# Patient Record
Sex: Female | Born: 1945 | Race: White | Hispanic: No | Smoking: Never smoker
Health system: Southern US, Community
[De-identification: ages and names within clinical notes are randomized; demographics above are authoritative.]

## PROBLEM LIST (undated history)

## (undated) DIAGNOSIS — R600 Localized edema: Secondary | ICD-10-CM

## (undated) DIAGNOSIS — E559 Vitamin D deficiency, unspecified: Secondary | ICD-10-CM

## (undated) DIAGNOSIS — C50919 Malignant neoplasm of unspecified site of unspecified female breast: Secondary | ICD-10-CM

## (undated) DIAGNOSIS — K5730 Diverticulosis of large intestine without perforation or abscess without bleeding: Secondary | ICD-10-CM

## (undated) DIAGNOSIS — T7840XA Allergy, unspecified, initial encounter: Secondary | ICD-10-CM

## (undated) DIAGNOSIS — E785 Hyperlipidemia, unspecified: Secondary | ICD-10-CM

## (undated) DIAGNOSIS — I214 Non-ST elevation (NSTEMI) myocardial infarction: Secondary | ICD-10-CM

## (undated) DIAGNOSIS — I10 Essential (primary) hypertension: Secondary | ICD-10-CM

## (undated) DIAGNOSIS — R609 Edema, unspecified: Secondary | ICD-10-CM

## (undated) DIAGNOSIS — E079 Disorder of thyroid, unspecified: Secondary | ICD-10-CM

## (undated) DIAGNOSIS — I499 Cardiac arrhythmia, unspecified: Secondary | ICD-10-CM

## (undated) HISTORY — DX: Allergy, unspecified, initial encounter: T78.40XA

## (undated) HISTORY — DX: Non-ST elevation (NSTEMI) myocardial infarction: I21.4

## (undated) HISTORY — DX: Vitamin D deficiency, unspecified: E55.9

## (undated) HISTORY — DX: Disorder of thyroid, unspecified: E07.9

## (undated) HISTORY — PX: MASTECTOMY: SHX3

## (undated) HISTORY — DX: Hyperlipidemia, unspecified: E78.5

## (undated) HISTORY — PX: CHOLECYSTECTOMY: SHX55

## (undated) HISTORY — DX: Edema, unspecified: R60.9

## (undated) HISTORY — DX: Localized edema: R60.0

## (undated) HISTORY — DX: Essential (primary) hypertension: I10

## (undated) HISTORY — DX: Malignant neoplasm of unspecified site of unspecified female breast: C50.919

## (undated) HISTORY — DX: Cardiac arrhythmia, unspecified: I49.9

## (undated) HISTORY — PX: ABDOMINAL HYSTERECTOMY: SHX81

---

## 2000-02-03 ENCOUNTER — Encounter: Payer: Self-pay | Admitting: Hematology and Oncology

## 2000-02-03 ENCOUNTER — Encounter: Admission: RE | Admit: 2000-02-03 | Discharge: 2000-02-03 | Payer: Self-pay | Admitting: Hematology and Oncology

## 2000-07-03 ENCOUNTER — Encounter: Admission: RE | Admit: 2000-07-03 | Discharge: 2000-07-03 | Payer: Self-pay | Admitting: Family Medicine

## 2000-07-03 ENCOUNTER — Encounter: Payer: Self-pay | Admitting: Family Medicine

## 2001-08-15 ENCOUNTER — Ambulatory Visit (HOSPITAL_COMMUNITY): Admission: RE | Admit: 2001-08-15 | Discharge: 2001-08-15 | Payer: Self-pay | Admitting: Obstetrics and Gynecology

## 2001-08-15 ENCOUNTER — Encounter: Payer: Self-pay | Admitting: Obstetrics and Gynecology

## 2001-12-27 ENCOUNTER — Other Ambulatory Visit: Admission: RE | Admit: 2001-12-27 | Discharge: 2001-12-27 | Payer: Self-pay | Admitting: Obstetrics and Gynecology

## 2003-01-08 ENCOUNTER — Encounter: Payer: Self-pay | Admitting: Cardiology

## 2003-01-08 ENCOUNTER — Inpatient Hospital Stay (HOSPITAL_COMMUNITY): Admission: EM | Admit: 2003-01-08 | Discharge: 2003-01-10 | Payer: Self-pay | Admitting: Emergency Medicine

## 2003-01-09 ENCOUNTER — Encounter: Payer: Self-pay | Admitting: Cardiology

## 2003-05-07 ENCOUNTER — Encounter: Payer: Self-pay | Admitting: Emergency Medicine

## 2003-05-07 ENCOUNTER — Inpatient Hospital Stay (HOSPITAL_COMMUNITY): Admission: AD | Admit: 2003-05-07 | Discharge: 2003-05-11 | Payer: Self-pay | Admitting: Emergency Medicine

## 2003-05-08 ENCOUNTER — Encounter: Payer: Self-pay | Admitting: Neurology

## 2004-02-08 ENCOUNTER — Emergency Department (HOSPITAL_COMMUNITY): Admission: EM | Admit: 2004-02-08 | Discharge: 2004-02-08 | Payer: Self-pay | Admitting: Emergency Medicine

## 2004-02-11 ENCOUNTER — Ambulatory Visit (HOSPITAL_COMMUNITY): Admission: RE | Admit: 2004-02-11 | Discharge: 2004-02-11 | Payer: Self-pay | Admitting: Family Medicine

## 2004-04-18 ENCOUNTER — Encounter: Admission: RE | Admit: 2004-04-18 | Discharge: 2004-04-18 | Payer: Self-pay | Admitting: Orthopaedic Surgery

## 2004-05-19 ENCOUNTER — Ambulatory Visit (HOSPITAL_COMMUNITY): Admission: RE | Admit: 2004-05-19 | Discharge: 2004-05-19 | Payer: Self-pay | Admitting: Neurology

## 2004-05-19 ENCOUNTER — Ambulatory Visit (HOSPITAL_COMMUNITY): Admission: RE | Admit: 2004-05-19 | Discharge: 2004-05-19 | Payer: Self-pay | Admitting: Obstetrics and Gynecology

## 2004-12-29 ENCOUNTER — Ambulatory Visit (HOSPITAL_COMMUNITY): Admission: RE | Admit: 2004-12-29 | Discharge: 2004-12-29 | Payer: Self-pay | Admitting: Family Medicine

## 2005-01-09 ENCOUNTER — Ambulatory Visit (HOSPITAL_COMMUNITY): Admission: RE | Admit: 2005-01-09 | Discharge: 2005-01-09 | Payer: Self-pay | Admitting: Family Medicine

## 2005-01-15 ENCOUNTER — Emergency Department (HOSPITAL_COMMUNITY): Admission: EM | Admit: 2005-01-15 | Discharge: 2005-01-15 | Payer: Self-pay | Admitting: Emergency Medicine

## 2005-02-03 ENCOUNTER — Ambulatory Visit: Payer: Self-pay | Admitting: Internal Medicine

## 2005-02-16 ENCOUNTER — Ambulatory Visit: Payer: Self-pay | Admitting: Internal Medicine

## 2005-02-16 ENCOUNTER — Ambulatory Visit (HOSPITAL_COMMUNITY): Admission: RE | Admit: 2005-02-16 | Discharge: 2005-02-16 | Payer: Self-pay | Admitting: Internal Medicine

## 2005-08-09 ENCOUNTER — Ambulatory Visit: Payer: Self-pay | Admitting: Cardiology

## 2005-08-30 ENCOUNTER — Ambulatory Visit: Payer: Self-pay | Admitting: Cardiology

## 2005-12-08 ENCOUNTER — Ambulatory Visit: Payer: Self-pay | Admitting: Cardiology

## 2005-12-14 ENCOUNTER — Inpatient Hospital Stay (HOSPITAL_BASED_OUTPATIENT_CLINIC_OR_DEPARTMENT_OTHER): Admission: RE | Admit: 2005-12-14 | Discharge: 2005-12-14 | Payer: Self-pay | Admitting: Internal Medicine

## 2005-12-14 ENCOUNTER — Ambulatory Visit: Payer: Self-pay | Admitting: Internal Medicine

## 2005-12-27 ENCOUNTER — Ambulatory Visit: Payer: Self-pay

## 2005-12-31 ENCOUNTER — Encounter: Admission: RE | Admit: 2005-12-31 | Discharge: 2005-12-31 | Payer: Self-pay | Admitting: Neurology

## 2006-01-24 ENCOUNTER — Ambulatory Visit: Payer: Self-pay | Admitting: Cardiology

## 2006-02-12 ENCOUNTER — Ambulatory Visit: Payer: Self-pay | Admitting: Internal Medicine

## 2006-02-21 ENCOUNTER — Ambulatory Visit: Payer: Self-pay | Admitting: Internal Medicine

## 2006-03-02 ENCOUNTER — Ambulatory Visit: Payer: Self-pay | Admitting: Internal Medicine

## 2006-03-29 ENCOUNTER — Encounter
Admission: RE | Admit: 2006-03-29 | Discharge: 2006-06-27 | Payer: Self-pay | Admitting: Physical Medicine & Rehabilitation

## 2006-03-29 ENCOUNTER — Ambulatory Visit: Payer: Self-pay | Admitting: Physical Medicine & Rehabilitation

## 2006-04-11 ENCOUNTER — Ambulatory Visit (HOSPITAL_COMMUNITY): Admission: RE | Admit: 2006-04-11 | Discharge: 2006-04-11 | Payer: Self-pay | Admitting: Internal Medicine

## 2006-05-01 ENCOUNTER — Emergency Department (HOSPITAL_COMMUNITY): Admission: EM | Admit: 2006-05-01 | Discharge: 2006-05-01 | Payer: Self-pay | Admitting: Emergency Medicine

## 2006-05-04 ENCOUNTER — Ambulatory Visit: Payer: Self-pay | Admitting: Family Medicine

## 2006-05-04 ENCOUNTER — Ambulatory Visit (HOSPITAL_COMMUNITY): Admission: RE | Admit: 2006-05-04 | Discharge: 2006-05-04 | Payer: Self-pay | Admitting: Family Medicine

## 2006-06-14 ENCOUNTER — Ambulatory Visit: Payer: Self-pay | Admitting: Family Medicine

## 2006-07-17 ENCOUNTER — Ambulatory Visit: Payer: Self-pay | Admitting: Cardiology

## 2006-08-02 ENCOUNTER — Ambulatory Visit: Payer: Self-pay | Admitting: Internal Medicine

## 2006-08-07 ENCOUNTER — Ambulatory Visit (HOSPITAL_COMMUNITY): Admission: RE | Admit: 2006-08-07 | Discharge: 2006-08-07 | Payer: Self-pay | Admitting: Internal Medicine

## 2006-08-23 ENCOUNTER — Ambulatory Visit: Payer: Self-pay | Admitting: Internal Medicine

## 2006-08-24 ENCOUNTER — Ambulatory Visit (HOSPITAL_COMMUNITY): Admission: RE | Admit: 2006-08-24 | Discharge: 2006-08-24 | Payer: Self-pay | Admitting: Internal Medicine

## 2006-09-25 ENCOUNTER — Encounter: Admission: RE | Admit: 2006-09-25 | Discharge: 2006-10-23 | Payer: Self-pay | Admitting: Psychology

## 2006-09-25 ENCOUNTER — Ambulatory Visit: Payer: Self-pay | Admitting: Psychology

## 2006-11-26 ENCOUNTER — Ambulatory Visit: Payer: Self-pay | Admitting: Physical Medicine & Rehabilitation

## 2006-11-26 ENCOUNTER — Encounter
Admission: RE | Admit: 2006-11-26 | Discharge: 2007-02-24 | Payer: Self-pay | Admitting: Physical Medicine & Rehabilitation

## 2007-01-08 ENCOUNTER — Ambulatory Visit: Payer: Self-pay

## 2007-01-23 ENCOUNTER — Ambulatory Visit: Payer: Self-pay | Admitting: Cardiology

## 2007-03-26 ENCOUNTER — Ambulatory Visit (HOSPITAL_COMMUNITY): Admission: RE | Admit: 2007-03-26 | Discharge: 2007-03-26 | Payer: Self-pay | Admitting: Family Medicine

## 2007-07-26 ENCOUNTER — Ambulatory Visit: Payer: Self-pay | Admitting: Cardiology

## 2007-08-07 ENCOUNTER — Encounter: Payer: Self-pay | Admitting: Cardiology

## 2007-08-07 ENCOUNTER — Ambulatory Visit: Payer: Self-pay

## 2007-08-26 ENCOUNTER — Ambulatory Visit: Payer: Self-pay | Admitting: Physical Medicine & Rehabilitation

## 2007-08-26 ENCOUNTER — Encounter
Admission: RE | Admit: 2007-08-26 | Discharge: 2007-10-22 | Payer: Self-pay | Admitting: Physical Medicine & Rehabilitation

## 2007-11-20 ENCOUNTER — Ambulatory Visit: Payer: Self-pay | Admitting: Cardiology

## 2007-11-22 ENCOUNTER — Ambulatory Visit: Payer: Self-pay

## 2007-12-05 ENCOUNTER — Encounter
Admission: RE | Admit: 2007-12-05 | Discharge: 2007-12-16 | Payer: Self-pay | Admitting: Physical Medicine & Rehabilitation

## 2007-12-13 ENCOUNTER — Ambulatory Visit: Payer: Self-pay | Admitting: Physical Medicine & Rehabilitation

## 2007-12-24 ENCOUNTER — Encounter
Admission: RE | Admit: 2007-12-24 | Discharge: 2008-01-21 | Payer: Self-pay | Admitting: Physical Medicine & Rehabilitation

## 2008-01-14 ENCOUNTER — Ambulatory Visit: Payer: Self-pay | Admitting: Physical Medicine & Rehabilitation

## 2008-05-14 ENCOUNTER — Ambulatory Visit (HOSPITAL_COMMUNITY): Admission: RE | Admit: 2008-05-14 | Discharge: 2008-05-14 | Payer: Self-pay | Admitting: Family Medicine

## 2008-07-02 ENCOUNTER — Ambulatory Visit: Payer: Self-pay

## 2010-05-04 DIAGNOSIS — E039 Hypothyroidism, unspecified: Secondary | ICD-10-CM | POA: Insufficient documentation

## 2010-05-04 DIAGNOSIS — I6350 Cerebral infarction due to unspecified occlusion or stenosis of unspecified cerebral artery: Secondary | ICD-10-CM | POA: Insufficient documentation

## 2010-05-04 DIAGNOSIS — R001 Bradycardia, unspecified: Secondary | ICD-10-CM

## 2010-05-04 DIAGNOSIS — E785 Hyperlipidemia, unspecified: Secondary | ICD-10-CM | POA: Insufficient documentation

## 2010-05-04 DIAGNOSIS — I10 Essential (primary) hypertension: Secondary | ICD-10-CM

## 2010-06-20 ENCOUNTER — Ambulatory Visit: Payer: Self-pay | Admitting: Cardiovascular Disease

## 2010-06-20 DIAGNOSIS — I429 Cardiomyopathy, unspecified: Secondary | ICD-10-CM | POA: Insufficient documentation

## 2010-12-04 ENCOUNTER — Encounter: Payer: Self-pay | Admitting: Family Medicine

## 2010-12-04 ENCOUNTER — Encounter: Payer: Self-pay | Admitting: Orthopedic Surgery

## 2010-12-13 NOTE — Assessment & Plan Note (Signed)
Summary: f1y/jml  Medications Added LIPITOR 20 MG TABS (ATORVASTATIN CALCIUM) 1 tab at bedtime LEVOTHYROXINE SODIUM 150 MCG TABS (LEVOTHYROXINE SODIUM) 1 tab once daily BENAZEPRIL HCL 20 MG TABS (BENAZEPRIL HCL) 1 tab once daily IBUPROFEN 600 MG TABS (IBUPROFEN) 1 tab once daily FEXOFENADINE HCL 180 MG TABS (FEXOFENADINE HCL) 1 tab once daily ISOSORBIDE MONONITRATE CR 60 MG XR24H-TAB (ISOSORBIDE MONONITRATE) 1 tab once daily FUROSEMIDE 40 MG TABS (FUROSEMIDE) 1 tab once daily ASPIRIN 81 MG TBEC (ASPIRIN) Take one tablet by mouth daily NOVOLOG MIX 70/30 70-30 % SUSP (INSULIN ASPART PROT & ASPART) 35 units once daily      Allergies Added: NKDA  Visit Type:  Follow-up Primary Provider:  Dr. Elana Alm.[facility] [place]  CC:  edema/legs...no other complaints today.  History of Present Illness: [name] is a pleasant 65 year old Caucasian female with a history of diabetes mellitus, hypertension, hyperlipidemia, hypothyroidism, depression, chronic back pain, and a remote non-ST elevation myocardial infarction with left ventricular wall motion abnormality in 2004.  It was felt at that time to be related to stress induced cardiomyopathy.  Her heart catheterization was in February 2004 and showed no obstructive CAD.  Repeat echocardiogram later showed resolution of her left ventricular dysfunction. Dr. Diona Browner did have her undergo a myocardial perfusion stress study in January 2009, which showed no evidence of myocardial ischemia and normal contractility of the left ventricle. She had been followed in the past by Dr. Diona Browner. I saw her for the first time in 2009.  She is here today for routine scheduled cardiac follow up. She denies any chest pain with exertion, shortness of breath, palpitations, dizziness, near syncope, syncope, orthopnea, PND or change in her chronic lower extremity swelling, which has been trace to mild.    Her chronic back pain is much better.   Current  Medications (verified): 1)  Amlodipine Besylate 5 Mg Tabs (Amlodipine Besylate) .... Take One Tablet By Mouth Daily 2)  Lipitor 20 Mg Tabs (Atorvastatin Calcium) .Marland Kitchen.. 1 Tab At Bedtime 3)  Levothyroxine Sodium 150 Mcg Tabs (Levothyroxine Sodium) .Marland Kitchen.. 1 Tab Once Daily 4)  Benazepril Hcl 20 Mg Tabs (Benazepril Hcl) .Marland Kitchen.. 1 Tab Once Daily 5)  Ibuprofen 600 Mg Tabs (Ibuprofen) .Marland Kitchen.. 1 Tab Once Daily 6)  Fexofenadine Hcl 180 Mg Tabs (Fexofenadine Hcl) .Marland Kitchen.. 1 Tab Once Daily 7)  Isosorbide Mononitrate Cr 60 Mg Xr24h-Tab (Isosorbide Mononitrate) .Marland Kitchen.. 1 Tab Once Daily 8)  Furosemide 40 Mg Tabs (Furosemide) .Marland Kitchen.. 1 Tab Once Daily 9)  Aspirin 81 Mg Tbec (Aspirin) .... Take One Tablet By Mouth Daily 10)  Novolog Mix 70/30 70-30 % Susp (Insulin Aspart Prot & Aspart) .... 35 Units Once Daily  Allergies (verified): No Known Drug Allergies  Past History:  Past Medical History: 1. Acute left small non-hemorrhagic infarct in posterotemporal       occipital lobe.  On discharge, no neuro deficit.   2. Hypertension.   3. Bradycardia (asymptomatic).  Atenolol discontinued on discharge.   4. Hypothyroidism.   5. Hyperlipidemia.  6. NSTEMI 2004 secondary to stress induced cardiomyopathy. Normal LV function by repeat echo.  7. Breast cancer s/p Left mastectomy  Past Surgical History: Tumor resection of the bladder.  Left mastectomy Hysterectomy 2002 Cholecystectomy  Social History: Reviewed history from 05/04/2010 and no changes required. No tobacco, alcohol or illicit drugs Married, 4 children Retired     Review of Systems       The patient complains of leg swelling.  The patient denies fatigue, malaise,  fever, weight gain/loss, vision loss, decreased hearing, hoarseness, chest pain, palpitations, shortness of breath, prolonged cough, wheezing, sleep apnea, coughing up blood, abdominal pain, blood in stool, nausea, vomiting, diarrhea, heartburn, incontinence, blood in urine, muscle weakness, joint  pain, rash, skin lesions, headache, fainting, dizziness, depression, anxiety, enlarged lymph nodes, easy bruising or bleeding, and environmental allergies.    Vital Signs:  Patient profile:   65 year old female Height:      62.5 inches Weight:      174 pounds BMI:     31.43 Pulse rate:   85 / minute Pulse rhythm:   irregular BP sitting:   100 / 60  (right arm) Cuff size:   large  Vitals Entered By: Danielle Rankin, CMA (June 20, 2010 1:33 PM)  Physical Exam  General:  General: Well developed, well nourished, NAD Musculoskeletal: Muscle strength 5/5 all ext Psychiatric: Mood and affect normal Neck: No JVD, no carotid bruits, no thyromegaly, no lymphadenopathy. Lungs:Clear bilaterally, no wheezes, rhonci, crackles CV: RRR no murmurs, gallops rubs Abdomen: soft, NT, ND, BS present Extremities: No edema, pulses 2+.    EKG  Procedure date:  06/20/2010  Findings:      NSR, rate 85 bpm. Poor R wave progression.  Impression & Recommendations:  Problem # 1:  CARDIOMYOPATHY, SECONDARY (ICD-425.9)  Her LV function is normal. She has a remote history of non-ischemic cardiomyopathy felt to be secondary to stress induced cardiomyopathy.  She has no signs or symptoms suggestive of CHF. I will repeat an echo in one year and see her after the echo.   The following medications were removed from the medication list:    Atenolol 50 Mg Tabs (Atenolol) .Marland Kitchen... 1 tab once daily    Atenolol 25 Mg Tabs (Atenolol) .Marland Kitchen... 1 tab once daily Her updated medication list for this problem includes:    Amlodipine Besylate 5 Mg Tabs (Amlodipine besylate) .Marland Kitchen... Take one tablet by mouth daily    Benazepril Hcl 20 Mg Tabs (Benazepril hcl) .Marland Kitchen... 1 tab once daily    Isosorbide Mononitrate Cr 60 Mg Xr24h-tab (Isosorbide mononitrate) .Marland Kitchen... 1 tab once daily    Furosemide 40 Mg Tabs (Furosemide) .Marland Kitchen... 1 tab once daily    Aspirin 81 Mg Tbec (Aspirin) .Marland Kitchen... Take one tablet by mouth daily  Orders: EKG w/  Interpretation (93000)  Patient Instructions: 1)  Your physician recommends that you schedule a follow-up appointment in: 12 months 2)  Your physician recommends that you continue on your current medications as directed. Please refer to the Current Medication list given to you today. 3)  Your physician has requested that you have an echocardiogram.  Echocardiography is a painless test that uses sound waves to create images of your heart. It provides your doctor with information about the size and shape of your heart and how well your heart's chambers and valves are working.  This procedure takes approximately one hour. There are no restrictions for this procedure. To be done in 12 months--1-2 weeks prior to appointment with Dr.McAlhany

## 2011-03-28 NOTE — Assessment & Plan Note (Signed)
Brianna Weber is back regarding her back.  I last saw her in October.  She  did not want to pursue lumbar injections after her MRI.  Her MRI  demonstrated bulging at L4-L5 with facet hypertrophy and left foraminal  narrowing and possible impingement upon  the L4 and L5 nerve roots.  She  was called in some Vicodin for breakthrough pain which seems to have  worked a bit better than the Ultram did.  She has tolerated it fairly  well.  She still rate her pain, however, 8 to 10 out of 10.  She has  been out of the hydrocodone for a while now, however.   The patient describes her pain as sharp, stabbing, aching, and constant.  The pain interferes with general activity, relations with others, and  enjoyment of life on a moderate level.  The pain is worse with walking,  bending, sitting and activity.  It does better when she paces herself  and rests in general.   REVIEW OF SYSTEMS:  Notable for tingling, trouble walking, bladder  control problems, chills, weight gain, diarrhea.  Other pertinent  positives are above and full review is in the written health and history  section.   SOCIAL HISTORY:  The patient is married and does not smoke.   PHYSICAL EXAMINATION:  VITAL SIGNS:  Blood pressure is 130/67, pulse is  65, respiratory rate 18.  She is sating 99% on room air.  GENERAL:  The patient is pleasant, alert and oriented x3.  Affect is  generally flat.  MUSCULOSKELETAL:  She walks with some antalgic gait to the right.  She  has difficulty keeping a straight posture and tends to favor the left  side a bit in stance.  She has pain along the PSIS area on the right  side more than left.  She has reflexes 1 plus throughout.  No sensory  deficits were seen.  She was a bit weaker on the right arm and leg at 4  to 4 plus out of 5, compared to the left today.  Range of motion was  fair.  The patient has pain in the back with rotation of the hip today.  Carpal tunnel testing was equivocal to positive in  both hands.  MENTAL STATUS:  Cognition was fair.   ASSESSMENT:  1. Chronic low back pain with degenerative disk disease and facet      arthropathy.  MRI findings do not corroborate with the majority of      her clinical complaints.  2. Potential right sacroiliac joint inflammation.  3. Left cerebrovascular accident with right hemiparesis and      paresthesias.  4. History of carpal tunnel syndrome.  5. Obesity.  6. Decreased mood.   PLAN:  1. I would like to send the patient over to physical therapy to work      on SI joint exercises modalities.  I would like to see how this      works out for her.  2. We will discontinue the Lyrica, she is unable to tolerate more than      one a day and begin a trial of Keppra 250 mg q.h.s. increasing to      b.i.d. after one week.  3. Norco 5/325 for breakthrough pain, one q.8 h. p.r.n.  4. Continue Lidoderm patches and Flexeril.  5. I will see her back in about a month's time.      Ranelle Oyster, M.D.  Electronically Signed  ZTS/MedQ  D:  12/16/2007 10:37:15  T:  12/16/2007 13:31:41  Job #:  [id number]   cc:   Santina Evans A. Orlin Hilding, M.D.  Fax: [phone]

## 2011-03-28 NOTE — Assessment & Plan Note (Signed)
[facility]                            CARDIOLOGY OFFICE NOTE   BECKEY, POLKOWSKI                  MRN:          [id number]  DATE:07/26/2007                            DOB:          01-24-1946    PRIMARY CARE PHYSICIAN:  Corrie Mckusick, M.D.   REASON FOR VISIT:  Followup cardiomyopathy.   HISTORY OF PRESENT ILLNESS:  I saw Ms. Eichholz back in March.  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  Symptomatically she has been relatively stable,  although she is reporting more problems with indigestion which is a  symptom similar to her presenting cardiac complaints at the time of her  cardiomyopathy.  She has noted some improvement with over-the-counter  antacids, however.  Today I reviewed her medications and we talked about  some adjustments as well as a followup echocardiogram.  Her  electrocardiogram today is normal showing a sinus rhythm at 63 beats per  minute.   ALLERGIES:  CELEXA.   PRESENT MEDICATIONS:  1. Vytorin 10/20 mg p.o. daily.  2. Lidoderm patch.  3. Januvia 100 mg p.o. daily.  4. B vitamin.  5. Glipizide 10 mg p.o. daily.  6. Lantus as directed.  7. Lyrica 75 mg daily.  8. Alprazolam 0.5 mg p.o. b.i.d.  9. Fexofenadine 180 mg p.o. daily.  10.Lasix 40 mg p.o. daily.  11.Toprol XL 25 mg p.o. daily.  12.Benazepril HCTZ 20 mg p.o. daily.  13.Aspirin 81 mg p.o. daily.  14.Isordil 60 mg p.o. q.a.m. and 30 mg p.o. q.p.m.  15.Norvasc 2.5 mg p.o. daily.  16.Synthroid 125 mcg p.o. daily.   REVIEW OF SYSTEMS:  As described in the history of present illness.   PHYSICAL EXAMINATION:  VITAL SIGNS:  Blood pressure 138/82, heart rate  63, weight 198 pounds which is stable.  GENERAL:  The patient is comfortable in no acute distress.  HEENT:  Normal.  NECK:  Supple.  No elevated jugular venous pressure .  No  loud bruits.  Thyroid is normal.  LUNGS:  Clear without labored breathing.  CARDIAC:  Reveals a regular rate and rhythm.  No S3 gallop or  pericardial rub.  ABDOMEN;  Soft, nontender.  EXTREMITIES:  There is trace edema.  Distal pulses 2 plus.  SKIN:  Warm and dry.  MUSCULOSKELETAL:  No kyphosis is noted.  NEUROPSYCHIATRIC:  The patient is alert and oriented x3.   IMPRESSION/RECOMMENDATION:  1. History of Tako-Tsubo cardiomyopathy with subsequent normalization      of left ventricular function.  This was last assessed approximately      a year and a half ago with findings of minimal coronary      atherosclerosis and continued preservation of left ventricular      function.  I plan to increase her Norvasc to 5 mg daily to address      both hypertension and the possibility of vasospastic angina,      although I suspect some of her symptoms may actually just be      indigestion.  Otherwise,  we will plan to see her back over the next      6 months and can proceed from there.  2. Continue followup with Dr. Phillips Odor.     Jonelle Sidle, MD  Electronically Signed    SGM/MedQ  DD: 07/26/2007  DT: 07/26/2007  Job #: [id number]   cc:   Corrie Mckusick, M.D.

## 2011-03-28 NOTE — Assessment & Plan Note (Signed)
[facility]                            CARDIOLOGY OFFICE NOTE   Brianna Weber, Brianna Weber                  MRN:          [id number]  DATE:11/20/2007                            DOB:          25-May-1946    PRIMARY CARE PHYSICIAN:  Dr. Assunta Found.   REASON FOR VISIT:  Patient scheduled followup.   HISTORY OF PRESENT ILLNESS:  I saw Brianna Weber back in September.  She  has a history of Takotsubo cardiomyopathy with minimal coronary artery  disease, based on cardiac catheterization with subsequent normalization  of left ventricular function.  We have been treating her medically, also  given the possibility of occasional vasospastic angina.  She called to  schedule a follow-up visit after having some recurrent chest pain on  Sunday.  He describes a sharp chest pain that began suddenly at rest  and ultimately resolved after several minutes with two some  nitroglycerin tablets.  She has felt somewhat fatigued, but has had no  recurrent pain since then.  Electrocardiogram today is stable, showing  sinus rhythm with mildly increased voltage and nonspecific T-wave  changes.  She reports compliance with her medications.   ALLERGIES:  NO KNOWN DRUG ALLERGIES.   PRESENT MEDICATIONS:  1. Vytorin 10/20 mg p.o. q.d.  2. Januvia 100 mg p.o. q.d.  3. Glipizide 10 mg p.o. q.d.  4. Lantus insulin.  5. Lyrica 75 mg p.o. q.d.  6. Trimethoprim sulfamethoxazole 160/800 mg as directed.  7. Norvasc 5 mg p.o. q.d.  8. Levothyroxine 150 mcg p.o.q.d.  9. Alprazolam 0.5 mg p.o. b.i.d.  10.Fexofenadine 180 mg p.o. q.d.  11.Lasix 40 mg p.o. q.d.  12.Benazepril 20 mg p.o. q.d.  13.Metoprolol 25 mg.  14.Aspirin 81 mg p.o. q.d.  15.Imdur 60 mg p.o. q.a.m. and 30 mg p.o. q.p.m.   REVIEW OF SYSTEMS:  As described in present illness.   EXAMINATION:  VITAL SIGNS:  Blood pressure is 140/86, heart rate is 70,  weight is 188 pounds which is stable.  GENERAL:  The patient is  comfortable, in no acute distress.  HEENT:  Conjunctivae was normal.  Pharynx clear.  NECK:  Supple.  No elevated jugular venous pressure.  No loud bruits or  thyromegaly is noted.  LUNGS:  Clear without work of breathing.  CARDIAC:  Reveals a regular rate and rhythm.  No S3 gallop.  ABDOMEN:  Soft, nontender.  EXTREMITIES:  Show no significant pitting edema.  His pulses are 2+.  SKIN:  Warm and dry.  MUSCULOSKELETAL:  No kyphosis noted.  NEUROPSYCHIATRIC:  Patient is alert and oriented x3.   IMPRESSION/RECOMMENDATION:  1. Recurrent chest pain.  Electrocardiogram is relatively stable.  [name] is very concerned about her symptoms.  It has been      approximately 2 years since she was last evaluated from an ischemic      perspective.  I think the likelihood of progressive obstructive      coronary disease is low, although she could be having recurrent      vasospastic symptoms.  I have asked her to increase her  Imdur to 60      mg p.o. b.i.d., and we will schedule a follow-up adenosine Myoview      and medical therapy.  If this is low risk, would anticipate      continue medical therapy, and I would see back in 6 months.      Otherwise, we can discuss the situation further.  2. Further plans to follow.     Jonelle Sidle, MD  Electronically Signed    SGM/MedQ  DD: 11/20/2007  DT: 11/20/2007  Job #: [id number]   cc:   Corrie Mckusick, M.D.

## 2011-03-28 NOTE — Assessment & Plan Note (Signed)
Brianna Weber is back regarding her chronic pain.  The only scans I was able  to find are a CT myelogram from 2005 and a plain film x-ray from the  same year.  The CT myelogram revealed lumbar facet arthropathy, small  disk protrusion on the left at L4-5, and degenerative disk disease with  mild bulge at L5-S1.  She states that she has an MRI, but I have no copy  or evidence of those available thus far.  Does state that it has been  some time since she has had one.  Patient reports the Lidoderm patch is  helping a bit.  The Flexeril makes her sedated, so she has had a hard  time using that during the day.  She continues to have pain in the back,  specifically on the left lower back radiating into the left thigh and  often down into the foot.  The left arm bothers her as well.  She  describes the arm pain as burning and tingling.  She has sharp, burning  pain in the right leg as well.  The pain improves with rest and worsens  with activity, particularly walking, standing, and bending.  She rates  her pain as a 6-7/10.  The pain interferes with general activity,  relationships with others, enjoyment of life on a moderate level.   REVIEW OF SYSTEMS:  Notable for bladder control problems, trouble  walking, anxiety.  Other pertinent positives are listed above.   SOCIAL HISTORY:  Unchanged.  Patient is married.  She does not smoke or  drink.   PHYSICAL EXAMINATION:  Blood pressure is 142/87, pulse 75.  Respiratory  rate 18.  She is satting 98% on room air.  Patient is pleasant, alert and oriented x3.  Affect is bright and  feeling appropriate.  She walks with a cane.  Is antalgic on the right  leg.  Right and left legs were equal in strength at +4-5/5 with some  pain inhibition at the back.  Reflexes are trace to 1+ diffusely,  although I think the patient had a hard time relaxing.  No obvious  sensory findings were appreciated.  Patient had pain with palpation over  both PSI joints, particular  the right side.  She had pain with flexion  and extension today and really all movement there.  The right upper  extremity was a bit weaker in strength compared to the left at 4-4+/5.  She had questionable sensory loss there.  Range of motion was fair.  No  neck pain was appreciated, per say, today.   ASSESSMENT:  1. Chronic low back pain with history of degenerative disk disease and      facet arthropathy.  She has some radicular pain by history but no      evidence on exam and with available imaging I have that this is the      case.  I question whether she may just have an SI joint      inflammation.  2. History of cerebrovascular accident.  Patient may be suffering from      right upper extremity paresthesias as related to this in the      setting of her existing carpal tunnel syndrome.  3. Obesity.   PLAN:  1. We will send the patient for a new MRI of the lumbar spine without      contrast to rule out radiculopathy.  Consider interventional plan      based on findings.  2. Continue  Lidoderm patches and p.r.n. Flexeril.  Will add low dose      Ultram 25-50 mg q.6-8h. p.r.n. for her back and arm pain.  Consider      another anticonvulsant.  She used Neurontin in the past, and it      apparently made her stomach upset.  3. Once we reduce her pain, we will consider physical therapy.  4. I will see her back in about a month.      Ranelle Oyster, M.D.  Electronically Signed     ZTS/MedQ  D:  08/27/2007 10:31:22  T:  08/27/2007 17:12:43  Job #:  [id number]   cc:   Santina Evans A. Orlin Hilding, M.D.  Fax: [phone]

## 2011-03-28 NOTE — Assessment & Plan Note (Signed)
[facility]                            CARDIOLOGY OFFICE NOTE   Brianna Weber, Brianna Weber                  MRN:          [id number]  DATE:07/02/2008                            DOB:          1945/12/26    PRIMARY CARE PHYSICIAN:  Corrie Mckusick, MD   REASON FOR VISIT:  Scheduled cardiac followup.   HISTORY OF PRESENT ILLNESS:  Ms. Tortorelli is a pleasant 65 year old  Caucasian female with a history of diabetes mellitus, hypertension,  hyperlipidemia, hypothyroidism, depression, chronic back pain, and a  remote non-ST elevation myocardial infarction with left ventricular wall  motion abnormality.  It was felt at that time to be related to a stress  cardiomyopathy.  Her heart catheterization was in February 2004 and  showed no obstructive CAD.  Repeat echocardiogram later showed  resolution of her left ventricular dysfunction.  She has been followed  by Dr. Diona Browner over the last 5 years and today reports no changes in  her cardiac status.  She denies any chest pain with exertion, shortness  of breath, palpitations, dizziness, near syncope, syncope, orthopnea,  PND or change in her chronic lower extremity swelling, which has been  trace to mild.  She states that her legs have swollen occasionally since  her stroke occurred 5 years ago.  Dr. Diona Browner did have the patient  undergo a myocardial perfusion stress study in January 2009, which  showed no evidence of myocardial ischemia and normal contractility of  the left ventricle.  She is without any other complaints today except  for her chronic back pain, which she states is being followed by another  physician.   ALLERGIES:  No known drug allergies.   CURRENT MEDICATIONS:  1. Benazepril 20 mg once daily.  2. Aspirin 80 mg once daily  3. Imdur 60 mg twice daily.  4. Celexa 20 mg once daily.  5. Levetiracetam 250 mg at night.  6. Vitamin D weekly.  7. Vytorin 10/20 mg once daily.  8. Januvia 100 mg  once daily.  9. Glipizide 10 mg once daily.  10.Lantus injections.  11.Lyrica 75 mg once daily.  12.Norvasc 5 mg once daily.  13.Levothyroxine 150 mcg once daily.  14.Alprazolam 0.5 mg twice daily.  15.Fexofenadine 180 mg once daily.  16.Lasix 40 mg once daily.   REVIEW OF SYSTEMS:  As described in the history of present illness is  otherwise negative.   PHYSICAL EXAMINATION:  GENERAL:  She is a pleasant middle-aged Caucasian  female in no acute distress.  VITAL SIGNS:  Her blood pressure today is 106/80, pulse is 66 and  regular, respirations 12 and unlabored.  Weight is 180 pounds NECK:  No  JVD.  No carotid bruits.  SKIN:  Warm and dry.  LUNGS:  Clear to auscultation bilaterally without wheezes, rhonchi or  crackles noted.  CARDIOVASCULAR:  Regular rate and rhythm without murmurs, gallops or  rubs noted.  ABDOMEN:  Soft.  Bowel sounds present.  EXTREMITIES:  No evidence of edema today.  Pulses are 2+ in both lower  extremities.   DIAGNOSTIC STUDIES.:  A 12-lead electrocardiogram performed in our  office today shows normal sinus rhythm with changes consistent with left  ventricular hypertrophy.  This EKG is unchanged from EKG dated November 20, 2007.   ASSESSMENT/PLAN:  This is a pleasant 65 year old Caucasian female with  past medical history significant for diabetes mellitus, hypertension,  hyperlipidemia, chronic back pain and resolved stress cardiomyopathy  from 2004, who returns today for routine cardiac exam.  Her physical  examination is benign.  She gives me no symptoms that are suggestive of  angina or congestive heart failure.  As stated above recent stress test  showed no evidence of myocardial ischemia.  I will plan on continuing  her current medications and seeing her back in our office in 1 year.  The patient is instructed to let us know should she have a change in her  symptoms in the meantime.  I have also encouraged her to continue to  follow with her  primary care physician Dr. Phillips Odor.     Verne Carrow, MD  Electronically Signed    CM/MedQ  DD: 07/02/2008  DT: 07/03/2008  Job #: [id number]   cc:   Corrie Mckusick, M.D.

## 2011-03-31 NOTE — Consult Note (Signed)
NAME:  CATHA, ONTKO           ACCOUNT NO.:  [id number]   MEDICAL RECORD NO.:  [id number]          PATIENT TYPE:  AMB   LOCATION:  DAY                           FACILITY:  [facility]   PHYSICIAN:  Lionel December, M.D.    DATE OF BIRTH:  1946/02/05   DATE OF CONSULTATION:  02/03/2005  DATE OF DISCHARGE:                                   CONSULTATION   DATE OF CONSULTATION:  February 03, 2005.   CHIEF COMPLAINT:  Diverticulosis, occasional abdominal pain.   PHYSICIAN REQUESTING CONSULTATION:  Dr. Corrie Mckusick with [facility].   REASON FOR CONSULTATION:  Possible colonoscopy.   HISTORY OF PRESENT ILLNESS:  Mrs. Blanks is a 65 year old lady who presents  today at the request of Dr. Assunta Found for the possibility of a  colonoscopy.  She recently had an MRI of her right hip and was found to have  sigmoid diverticulosis.  He recommended that she have a colonoscopy.  She  tells me she had a flexible sigmoidoscopy and barium enema in 1997 or 1998  after her diagnosis of breast cancer.  She denies having any polyps.  She  had a paternal grandfather who had colon cancer.  She has one to small bowel  movements every day, especially after breakfast.  Occasionally she has some  crampy abdominal pain and gas, especially after eating certain food such as  onions and pinto beans.  She denies any melena, rectal bleeding, nausea,  vomiting, heartburn, weight loss.  She is interested in another colonoscopy,  given her personal history of breast cancer and family history of colon  cancer.   CURRENT MEDICATIONS:  1.  Aspirin 81 mg q.d.  2.  Benazepril HCL 20 mg q.d.  3.  Allegra 180 mg q.d.  4.  Toprol XL 50 mg 1/2 tablet daily.  5.  Isosorbide 60 mg q.d.  6.  Hydrochlorothiazide 12.5 mg q.d.  7.  Levothyroxine 100 mg q.d.  8.  Plavix 75 mg q.d.  9.  Xanax 0.5 mg b.i.d.  10. Actos daily.  11. Another medication for diabetes daily.  (The patient is unsure of the      name.)  12. Tylenol p.r.n.   ALLERGIES:  No known drug allergies.   PAST MEDICAL HISTORY:  Seasonal allergies, hypothyroidism, hypertension,  diabetes mellitus, a history of myocardial infarction in February, 2004,  although she denies any coronary artery disease.  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  She had an injury at work and has been  suffering from lumbar back pain with right hip pain with radiation into her  posterior thigh.  She tells me that an MRI of her back revealed a bulging  disk at L5-S1, but impingement of the nerve was on the left side.  She has  an appointment with the neurosurgeon in the near future.  She has had a  history of breast cancer in 1997, status post left mastectomy and  chemotherapy.  She had a complete hysterectomy, cholecystectomy, tubal  ligation.   FAMILY HISTORY:  Mother and father both died at age  60 of MI.  The paternal  grandfather had colon cancer.   SOCIAL HISTORY:  She is married and has two sons and three daughters.  She  is employed but is currently on disability due to her back injury.  She has  never been a smoker.  She has not consumed any alcohol since age 68.   REVIEW OF SYSTEMS:  See HPI for GI.  CONSTITUTIONAL:  Denies any weight  loss.  CARDIOPULMONARY:  Denies chest pain or shortness of breath.   PHYSICAL EXAMINATION:  VITAL SIGNS:  Weight 163.  Height 5 feet 2 inches.  Temperature 92.2.  Blood pressure 140/78, pulse 68.  GENERAL:  A pleasant, well-nourished, well-developed Caucasian female in no  acute distress.  The patient moves slowly and appears to have back pain.  SKIN:  Warm and dry with no jaundice.  HEENT:  Pupils are equal, round and reactive to light.  The conjunctivae are  pink.  Sclerae are nonicteric.  Oropharyngeal mucosa moist and pink.  No  lesions or erythema or exudate.  No lymphadenopathy or thyromegaly.  CHEST:  Lungs are clear to auscultation.  CARDIAC:  Exam reveals regular rate and  rhythm.  Normal S1, S2.  No murmurs,  rubs or gallops.  ABDOMEN:  Positive bowel sounds.  Soft, nontender, nondistended.  No  organomegaly or masses.  EXTREMITIES:  No edema.   IMPRESSION:  Mrs. Magar is a 65 year old lady with a personal history of  breast cancer, a family history of colorectal cancer, who was recently found  to have diverticulosis which was incidentally picked up on an MRI of her  right hip.  She previously had a flexible sigmoidoscopy with barium enema in  1997.  At this time it would be reasonable to proceed with a complete  colonoscopy, as she is at moderate risk for colon cancer.  She denies having  any history of diverticulitis.   PLAN:  1.  Colonoscopy in the near future.  2.  Recommend high-fiber diet.  3.  Further recommendations to follow.      LL/MEDQ  D:  02/03/2005  T:  02/03/2005  Job:  [id number]   cc:   Corrie Mckusick, M.D.  Fax: [phone]

## 2011-03-31 NOTE — Cardiovascular Report (Signed)
NAME:  Brianna Weber, Brianna Weber NO.:  [id number]   MEDICAL RECORD NO.:  [id number]          PATIENT TYPE:  OIB   LOCATION:  NA                           FACILITY:  [facility]   PHYSICIAN:  Arvilla Meres, M.D. LHCDATE OF BIRTH:  01/13/1946   DATE OF PROCEDURE:  12/14/2005  DATE OF DISCHARGE:                              CARDIAC CATHETERIZATION   PRIMARY CARE PHYSICIAN:  Corrie Mckusick, M.D.   CARDIOLOGIST:  Jonelle Sidle, M.D. [facility].   PATIENT IDENTIFICATION:  Brianna Weber is a 65 year old woman with a history  of Tako-Tsubo cardiomyopathy diagnosed in 2004. At that time, she had  cardiac catheterization by Dr. Peter Swaziland which showed essentially normal  coronary arteries. However, she did have mild LV dysfunction and an anterior  apical wall motion abnormality diagnostic of Tako-Tsubo. Since that time,  she has continued to have chest pain approximately once or twice a week,  resolved with nitroglycerin. Over the past two weeks, this has progressed,  and now she has some radiation down her arm. This is nonexertional. She was  seen by Dr. Diona Browner, and she was arranged to have a cardiac catheterization  in the outpatient [facility] laboratory to evaluate her coronary anatomy.   PROCEDURES PERFORMED:  1.  Selective coronary angiography.  2.  Left heart catheterization.  3.  Left ventriculogram.   DESCRIPTION OF PROCEDURE:  The risks and benefits of the catheterization  explained to Brianna Weber.  Consent was signed and placed on the chart. A 4-  [place] arterial sheath was placed in the right femoral artery using a  modified Seldinger technique. Standard catheters, including preformed  Judkins JL-4, JR-4, and  angled pigtail were used for the catheterization.  All catheter exchanges were made over a wire. There were no apparent  complications.   Central aortic pressure is 127/61, with a mean of 86. LV pressure was 129/4,  with an LVEDP of 11. There was no aortic stenosis on  pullback.   Left main normal.   LAD was a moderate-size vessel, coursing to the apex. It gave off three  diagonals. The first two were small to moderate in size, and a third was a  large branching diagonal. The distal LAD was small. There was a 30% stenosis  in the distal LAD just after the takeoff of the third diagonal.   Left circumflex was a moderate-to-large size vessel.  It gave off a single  large diagonal, and there is no angiographic CAD.   Right coronary artery was a large dominant vessel.  It gave off a small RV  branch and a large PDA and large PL. There was a 20% stenosis in the distal  right before the takeoff of the PL, as well as a 20% stenosis in the  proximal portion of the posterolateral.   Left ventriculogram shot in the RAO projection showed EF of 65%, with no  regional wall motion abnormalities. There is no mitral regurgitation.   ASSESSMENT:  1.  Minimal nonobstructive coronary disease.  2.  Normal left ventricular function.   PLAN/DISCUSSION:  This is likely noncardiac chest pain. However, given her  response to nitroglycerin, we will try a trial of Norvasc 2.5 mg a day for  potential spasm. Would consider possible GI workup in the future if chest  pain persists.      Arvilla Meres, M.D. [facility]  Electronically Signed     DB/MEDQ  D:  12/14/2005  T:  12/14/2005  Job:  [id number]   cc:   Corrie Mckusick, M.D.  Fax: [phone]   Jonelle Sidle, M.D. [facility]  [address]  [place]  [place] [address]

## 2011-03-31 NOTE — Discharge Summary (Signed)
NAME:  Brianna Weber, Brianna Weber                     ACCOUNT NO.:  [id number]   MEDICAL RECORD NO.:  [id number]                   PATIENT TYPE:  INP   LOCATION:  3036                                 FACILITY:  [facility]   PHYSICIAN:  Gustavus Messing. Orlin Hilding, M.D.          DATE OF BIRTH:  [birth year]   DATE OF ADMISSION:  05/07/2003  DATE OF DISCHARGE:  05/11/2003                                 DISCHARGE SUMMARY   DIAGNOSES AT THE TIME OF DISCHARGE:  1. Left brain transient ischemic attack thought secondary to apical     hypokinesis.  2. Coronary artery disease status post myocardial infarction in the fall of     2003 with severe apical hypokinesis.  3. Borderline non-insulin dependent diabetes, diet controlled.  4. Hypertension.  5. Breast cancer survivor, status post left mastectomy in 1996 and 1997.  6. Status post hysterectomy.  7. Status post cholecystectomy.  8. Hypothyroidism.   MEDICINES AT DISCHARGE:  1. Synthroid 0.15 mg a day.  2. Lotensin 20 mg a day.  3. Imdur 60 mg a day.  4. Hydrochlorothiazide 12.5 mg a day.  5. Toprol 50 mg XL a day.  6. Coumadin 7.5 mg a day.  7. Lovenox 80 mg subcu q.12h. x 4 days.   DIET:  Low salt, diabetic, regular consistency, thin liquids.   STUDIES PERFORMED:  1. CT of the head on admission shows generalized atrophy, more than would be     expected for a 65 year old female with no intracranial mass or     hemorrhage.  2. Chest x-ray, no active disease, metallic clips in the left axilla.  3. MRI of the head, no acute infarction.  4. MRA of the head, normal.  5. Carotid Doppler showing left 40-60 internal carotid artery stenosis.     This may be reflected due to high velocities and not truly stenotic at     all.  Right internal carotid artery stenosis.  Bilateral vertebral flow     antegrade.  6. A 2-D echocardiogram shows severe hypokinesis apical wall, severe     hypokinesis left distal septal wall, moderate hypokinesis lateral apical  wall, moderate hypokinesis inferior apical wall.  Ejection fraction 40 to     50%.  7. EKG shows sinus bradycardia.  Anterolateral posterior wall infarct has     improved since previous tracing.   LABORATORY RESULTS:  INR on the day of discharge, 1.3 with PTT 78.  Hemoglobin 12.6 and platelets 203.  Differential unremarkable.  Sodium 140,  potassium 3.6, chloride 109, CO2 25, glucose 153, BUN 20, creatinine 0.7,  calcium 9.3, total protein 6.9, albumin 3.6, AST 17, ALT 20, ALP 108, total  bilirubin 0.4.  Cardiac enzymes were negative.  Cholesterol 155,  triglycerides 123, HDL 50, and LDL 76.   HISTORY OF PRESENT ILLNESS:  [name] is a 65 year old, right-  handed white female who has a history of coronary artery disease with MI in  February 2004 confirmed with enzymes and EKG changes.  She was taking a  shower about 12:45 this afternoon after having canned beans, and while she  was in the shower, had the sudden onset of right leg and arm tingling and  weakness.  She had to sit down, felt she might fall but did not have  presyncope.  She came to the emergency room for evaluation of abnormal  symptoms.  Her symptoms lasted about an hour and a half and then resolved.  She was admitted for further workup.  She was not a TPA candidate secondary  to rapid improvement of symptoms.   HOSPITAL COURSE:  MRI revealed no acute infarction.  Felt the patient has a  left brain TIA, possibly cardioembolic source secondary to severe  hypokinesis of apical wall from MI in February.  While we cannot verify that  the patient actually had a TIA, the suspicion is high with clinical course  and felt due to a cardioembolic source.  Neurology felt Coumadin was her  best preventative measure for repeat event.  The patient was placed on  Coumadin and was slow to become therapeutic.  She was discharged home on  Lovenox bridge to Coumadin therapy.   Cardiology did evaluate the patient while in the  hospital.  Dr. Patty Sermons  felt that if no embolic source or residual wall motion abnormality, that she  could be switched to aspirin and Plavix unless atrial fibrillation or  flutter was documented.  Dr. Swaziland was to follow up.  He will do so in the  office after discharge.   CONDITION AT DISCHARGE:  The patient remains neurologically normal.  No  speech or language deficits.  No cranial nerve deficits.  No arm drift.  Normal rapid repetitive motion.  No weakness.  Sensation intact.   DISCHARGE PLAN:  1. Discharge home on Lovenox to Coumadin bridge.  The patient will get     follow-up INRs at Dr. Elvis Coil office starting May 12, 2003.  Dr. Swaziland     is to adjust Coumadin.  2. Follow up with Dr. Marcelino Freestone in four weeks.  3. Follow up with Dr. Swaziland within four weeks.  4. May return to work Tuesday, May 19, 2003 with no restrictions.     Annie Main, N.P.                         Maurya A. Orlin Hilding, M.D.    SB/MEDQ  D:  05/12/2003  T:  05/12/2003  Job:  [id number]   cc:   Marjory Lies, M.D.  [address]  [place]  [place] [address]  Fax: [phone]   Peter M. Swaziland, M.D.  [address]  [place], [place] [address]  Fax: [phone]    cc:   Marjory Lies, M.D.  [address]  [place]  [place] [address]  Fax: [phone]   Peter M. Swaziland, M.D.  [address]  [place], [place] [address]  Fax: [phone]

## 2011-03-31 NOTE — Group Therapy Note (Signed)
Monday, November 26, 2006:   CHIEF COMPLAINT:  Back and right leg pain.   HISTORY OF PRESENT ILLNESS:  This is a pleasant 65 year old white female  who has been seen in the past by Dr. Marcelino Freestone for chronic pain  syndrome with facial pain of an unclear etiology.  No definitive  demyelinating or neurological process was identified.  She does have a  history of an old stroke.  The patient states that her back problems per  se began in March of 2005 when she was working and pulling a pallet  backwards when she had sudden onset of low back pain.  This eventually  evolved into pain in the right leg.  She has been seen by Dr. Barnett Abu for management, who has recommended injections.  She has had a  series of injections, apparently a DRI, but I do not have records of  these.  She has had MRIs performed at [facility] and other locations as  well,  which were not available to me today.  The patient states that  she was told she had degenerative disc disease, but the lesions  apparently were on the left side as opposed to the right side where her  symptoms are located.   The patient reports her pain is a 6/10.  She describes it as sharp,  burning, stabbing, dull, constant, tingling and aching.  Pain interferes  with general activity, relations with others and enjoyment of life on a  severe level.  She sleeps fairly well.  She gets 6 hours, sometimes more  a night.  She generally sleeps on her side.  She finds that her pain is  worse with walking or standing for prolonged periods of time or after  she sits for a while, her back will stiffen up and she finds it  difficult to stand.  She is really using minimal medications for her  pain control and she has been intolerant of any inflammatories and other  narcotic analgesics in the past.  She has taken Lyrica over the last few  months, but does not notice any specific benefit in the context of her  back or leg pain.  She has not had  physical therapy.  She has used  minimal other modalities as well.  The patient states that she can walk  about 5 minutes at a time without having to stop.  She tries to use her  treadmill for exercise, but is very limited.  She uses a cane for  balance otherwise when she goes on errands.  The patient needs help with  meal preparation, shopping and household activities.   REVIEW OF SYMPTOMS:  The patient reports bladder control problems, bowel  control problems, trouble walking, dizziness, anxiety, depression, easy  bleeding, high and low blood sugars.  Other pertinent positives listed  above and full review is in the written health and history section.   PAST MEDICAL HISTORY:  Positive for coronary artery disease,  hypertension, prior stroke, hypothyroidism, breast cancer.  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  She has had diverticulitis,  insulin-requiring diabetes, gallbladder surgery in 1992, hysterectomy in  2002.   SOCIAL HISTORY:  The patient is married and denies alcohol or tobacco  use.   ALLERGIES:  GI upset to NONSTEROIDAL ANTIINFLAMMATORIES.  She has also  been INTOLERANT OF OXYCODONE AND DARVOCET in the past, which cause  confusion.   MEDICATIONS:  1. Aspirin 81 mg a day.  2. Isordil 60 mg a day.  3. Januvia 100 mg daily.  4. Lasix 40 mg daily.  5. Fexofenadine 180 mg daily.  6. Synthroid 125 mcg daily.  7. Glipizide daily 10 mg.  8. Centrum daily.  9. A rare Aleve as her stomach tolerates for pain.  10.Lantus insulin 35 units at night.  11.Vytorin daily.  12.Amlodipine 2.5 mg daily.  13.Lyrica 75 mg 3 times a day.  14.Benazepril daily.  15.Metoprolol daily.   FAMILY HISTORY:  Positive for heart disease, diabetes and high blood  pressure.   PHYSICAL EXAMINATION:  Blood pressure is 145/72, pulse is 68,  respiratory rate 16, she is satting 97% on room air.  The patient is pleasant, in no acute distress.  She is alert and  oriented times 3.   Affect is generally bright and appropriate.  Gait was  difficult as the patient was very rigid in her lumbar spine and pelvis.  She walked without her cane for support for me today.  HEART:  Regular.  CHEST:  Clear.  ABDOMEN:  Soft, nontender.  She had 5/5 strength in the upper extremities.  She had pain in addition  to weakness in the proximal lower extremities, but otherwise is 5/5.  Reflexes are 1+.  Sensory exam was not appreciably different in any of  the 4 extremities and I would rate that as normal today.  The patient  had a positive seated slump test as well as a straight-leg raise today  although really any type of movement provoked pain.  She had difficulty  laying down, sitting up, standing up due to discomfort.  When she stood  for me today, her back and pelvis were rotated counterclockwise and  there is a slight elevation of the right hemipelvis over the left of 1  cm.  She might have had a mild level of scoliosis of the lower  thoracic/upper lumbar spine as well.  Lumbar paraspinals were taut.  She  is very tender to palpation from L3 to S1, right greater than the left.  Flexion was minimal due to pain, she bent to about 20 degrees today.  Extension was painful at 5 degrees and subjectively was more difficult  than the flexion was.  Rotation and lateral bending were limited as well  due to pain.   ASSESSMENT:  1. 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  The patient      has ongoing right legged symptoms although her axial pain seems to      be the most severe.  By the looks of her examination and without      going on any radiographical data, I would say that a lot of her      pain seems to be facet mediated in nature.  The pain appears to be      most intense at the L4-S1 levels.  It is hard to definitively      isolate her pain source, considering that she is so uncomfortable     today and painful with any kind type of provocation.  2. History of  cerebrovascular accident.  3. Obesity.  4. History of degenerative disc disease by patient report.   PLAN:  1. Need to obtain procedure and MRI reports for over the last year and      a half.  2. Will try Lidoderm patches 1-2 to low back daily for 12-14 hours.  3. Begin muscle relaxant Flexeril 5-10 mg q.6 hours p.r.n.  4. The patient may take a modest dose of antiinflammatories as she      tolerates.  5. Consider injections, therapy, etc. based on further information      that I can gather.  Before we really can do therapy, we will need      to decrease her pain levels as I do not seeing her being able to      tolerate much in the way      of rehab at this point.  6. I will see her back in 3-4 weeks time.      Ranelle Oyster, M.D.  Electronically Signed     ZTS/MedQ  D:  11/27/2006 11:11:38  T:  11/27/2006 12:45:52  Job #:  [id number]   cc:   Santina Evans A. Orlin Hilding, M.D.  Fax: [phone]

## 2011-03-31 NOTE — H&P (Signed)
NAME:  Brianna Weber, Brianna Weber                     ACCOUNT NO.:  [id number]   MEDICAL RECORD NO.:  [id number]                   PATIENT TYPE:  INP   LOCATION:  3036                                 FACILITY:  [facility]   PHYSICIAN:  Gustavus Messing. Orlin Hilding, M.D.          DATE OF BIRTH:  14-Oct-1946   DATE OF ADMISSION:  05/07/2003  DATE OF DISCHARGE:                                HISTORY & PHYSICAL   CHIEF COMPLAINT:  Transient right-sided tingling and weakness.   HISTORY OF PRESENT ILLNESS:  The patient is a 65 year old right-handed white  woman with past medical history significant for coronary artery disease,  status post an MI in fall of 2003.  She also has hypertension, diet-  controlled diabetes.  She was taking a shower about 12:45 this afternoon  after having canned beans all morning and while she was taking a shower, she  had the sudden onset of right leg and then arm tingling and weakness.  She  had to sit down, felt she might fall, but did not have presyncope. There is  no headache, no vision changes, no speech or language disorder.   REVIEW OF SYSTEMS:  Negative for shortness of breath or chest pain.   PAST MEDICAL HISTORY:  Significant for coronary artery disease, status post  MI. Borderline non-insulin dependent diabetes mellitus diet-controlled.  Hypertension.  Breast cancer survivor, status post left mastectomy in 1996  and 1997.  She is also status post hysterectomy remotely and remote  cholecystectomy. She also has hypothyroidism.   MEDICATIONS:  1. Unithroid.  2. Aspirin.  3. Lotensin.  4. Isosorbide.  5. Mononitrate.  6. Toprol.  7. Hydrochlorothiazide.  8. Occasional Prevacid.   ALLERGIES:  No known drug allergies.   SOCIAL HISTORY:  She is married.  She works at [employer]. No cigarette  or alcohol use.   FAMILY HISTORY:  Positive for stroke and heart disease.   PHYSICAL EXAMINATION:  VITAL SIGNS: Temperature 97.2, pulse 60, respirations  20, blood  pressure 155/51, 98% saturation.  HEENT:  Normocephalic and atraumatic.  NECK:  Supple without bruits.  HEART:  Regular rate and rhythm.  LUNGS:  Clear to auscultation.  ABDOMEN:  Soft.  EXTREMITIES:  Without edema.  NEUROLOGY:  Mental status; she is awake, alert, and oriented.  Full and  normal language, namely repetition and comprehension.  Cranial nerves;  pupils were equal and reactive, could not see disc margins well, visual  fields are full to confrontation, 3.6, extraocular movements are intact  without nystagmus, ophthalmia karyostasis.  Facial sensation is normal.  Facial motor activity is intact without weakness or asymmetry.  Hearing is  intact. Palate is symmetric. Tongue is midline.  Motor examination; there is  no drift, no sateliting, she has normal rapid fine movement, and 5/5  strength in all four extremities with normal bulk. No fasciculations,  atrophy, or tremor. Reflexes are  1 to 2+ and symmetric with downgoing toes  to plantar  stimulation.  Coordination; finger-to-nose, rapid alternating  movement, and heel-to-shin are normal. I did not test gait or tandem gait.  Sensory examination is objectively normal with no alterations to light  touch, subacute vibration, and no extension to double simultaneous  stimulation, although, she reports that subjectively things still do not  feel right.   LABORATORY DATA:  Not all labs are in yet, but we do have white blood cell  count 5.9, hemoglobin 12.3, hematocrit 35.8, platelets 280, PT 13.7, INR  1.0, PTT 26.  CMET is pending.   IMPRESSION:  Left brain transient ischemic attack or stroke, question  subcortical with transient ascending right arm and leg numbness greater than  weakness, largely resolved with minimal subjective sensory disturbance and  normal neurologic examination.   PLAN:  Admit for evaluation. Check MRI of the brain, MR angiogram, two-  dimensional echocardiogram, carotid Doppler, Plavix, and continue  the  aspirin for now.                                               Neeka A. Orlin Hilding, M.D.    CAW/MEDQ  D:  05/07/2003  T:  05/08/2003  Job:  [id number]

## 2011-03-31 NOTE — Op Note (Signed)
NAME:  Brianna Weber, Brianna Weber           ACCOUNT NO.:  [id number]   MEDICAL RECORD NO.:  [id number]          PATIENT TYPE:  AMB   LOCATION:  DAY                           FACILITY:  [facility]   PHYSICIAN:  Lionel December, M.D.    DATE OF BIRTH:  1946/03/26   DATE OF PROCEDURE:  02/16/2005  DATE OF DISCHARGE:                                 OPERATIVE REPORT   PROCEDURE:  Colonoscopy.   INDICATIONS:  Lumi is a 65 year old Caucasian female who is undergoing  a screening colonoscopy. Personal history is positive for breast carcinoma,  and she remains in remission. Procedure risks were reviewed with the  patient, and informed consent was obtained.   PREMEDICATION:  Demerol 50 mg IV, Versed 5 mg IV.   FINDINGS:  Procedure performed in endoscopy suite. The patient's vital signs  and O2 saturation were monitored during the procedure and remained stable.  The patient was placed in left lateral position and rectal examination  performed. No abnormality noted on external or digital exam. Olympus  videoscope was placed in rectum and advanced under vision into tortuous  sigmoid colon where scattered diverticula were noted. Preparation was felt  to be satisfactory. Scope was passed into cecum which was identified by  ileocecal valve. Blunt-end of the cecum was well seen after turning the  patient on the right side. Pictures taken for the record. As the scope was  withdrawn, the colonic mucosa was once again carefully examined. Few more  diverticula were noted at hepatic flexure and transverse colon, but there  were no polyps and/or tumor masses. Rectal mucosa similarly was normal.  Scope was retroflexed to examine anorectal junction which was unremarkable.  Endoscope was straightened and withdrawn. The patient tolerated the  procedure well.   FINAL DIAGNOSIS:  Pan colonic diverticulosis. Most of the diverticula are  concentrated in the sigmoid colon, though.   RECOMMENDATIONS:  1.  High-fiber  diet.  2.  FiberChoice chew 2 tablets q.d. or equivalent.  3.  She should continue yearly Hemoccults and consider next screening exam      in 10 years from now.      NR/MEDQ  D:  02/16/2005  T:  02/16/2005  Job:  [id number]   cc:   Corrie Mckusick, M.D.  Fax: [phone]

## 2011-05-30 ENCOUNTER — Telehealth: Payer: Self-pay | Admitting: Cardiovascular Disease

## 2011-05-30 NOTE — Telephone Encounter (Signed)
Patient would like to set up her doppler test in august.

## 2011-05-30 NOTE — Telephone Encounter (Signed)
Will schedule an echocardiogram.

## 2011-06-16 ENCOUNTER — Encounter: Payer: Self-pay | Admitting: Cardiovascular Disease

## 2011-06-27 ENCOUNTER — Encounter: Payer: Self-pay | Admitting: Cardiovascular Disease

## 2011-06-28 ENCOUNTER — Encounter: Payer: Self-pay | Admitting: Cardiovascular Disease

## 2011-06-28 ENCOUNTER — Ambulatory Visit (HOSPITAL_COMMUNITY): Payer: Medicare Other | Attending: Cardiovascular Disease | Admitting: Radiology

## 2011-06-28 ENCOUNTER — Ambulatory Visit (INDEPENDENT_AMBULATORY_CARE_PROVIDER_SITE_OTHER): Payer: Medicare Other | Admitting: Cardiovascular Disease

## 2011-06-28 VITALS — BP 168/81 | HR 70 | Ht 62.5 in | Wt 176.8 lb

## 2011-06-28 DIAGNOSIS — E785 Hyperlipidemia, unspecified: Secondary | ICD-10-CM | POA: Insufficient documentation

## 2011-06-28 DIAGNOSIS — I10 Essential (primary) hypertension: Secondary | ICD-10-CM | POA: Insufficient documentation

## 2011-06-28 DIAGNOSIS — I498 Other specified cardiac arrhythmias: Secondary | ICD-10-CM | POA: Insufficient documentation

## 2011-06-28 DIAGNOSIS — E039 Hypothyroidism, unspecified: Secondary | ICD-10-CM | POA: Insufficient documentation

## 2011-06-28 DIAGNOSIS — I428 Other cardiomyopathies: Secondary | ICD-10-CM | POA: Insufficient documentation

## 2011-06-28 DIAGNOSIS — I429 Cardiomyopathy, unspecified: Secondary | ICD-10-CM

## 2011-06-28 DIAGNOSIS — Z8673 Personal history of transient ischemic attack (TIA), and cerebral infarction without residual deficits: Secondary | ICD-10-CM | POA: Insufficient documentation

## 2011-06-28 MED ORDER — AMLODIPINE BESYLATE 10 MG PO TABS: 10.0000 mg | ORAL_TABLET | Freq: Every day | ORAL | Status: DC

## 2011-06-28 NOTE — Patient Instructions (Signed)
Your physician recommends that you schedule a follow-up appointment in: 1 year  Your physician has recommended you make the following change in your medication: START NORVASC 10 mg daily.

## 2011-06-28 NOTE — Assessment & Plan Note (Signed)
Stable. LV function normal by echo today. No changes. Continue current therapy. ? Stress induced CM in past.

## 2011-06-28 NOTE — Assessment & Plan Note (Signed)
BP elevated today. Will increase Norvasc to 10 mg per day.

## 2011-06-28 NOTE — Progress Notes (Signed)
History of Present Illness:Brianna Weber is a pleasant 65 year old Caucasian female with a history of diabetes mellitus, hypertension, hyperlipidemia, hypothyroidism, depression, chronic back pain, and a remote non-ST elevation myocardial infarction with left ventricular wall motion abnormality in 2004.  It was felt at that time to be related to stress induced cardiomyopathy.  Her heart catheterization was in February 2004 and showed no obstructive CAD.  Repeat echocardiogram later showed resolution of her left ventricular dysfunction. Dr. Diona Browner did have her undergo a myocardial perfusion stress study in January 2009, which showed no evidence of myocardial ischemia and normal contractility of the left ventricle.   She is here today for routine scheduled cardiac follow up. She denies any chest pain with exertion, shortness of breath, palpitations, dizziness, near syncope, syncope, orthopnea, PND or change in her chronic lower extremity swelling, which has been trace to mild.    Her chronic back pain persists. Echo today appears unchanged with normal LV function, no significant valvular abnormalities. Most recent blood work with fasting lipids 06/16/11: TC: 164, HDL: 52, LDL 63, TG 245.  Creatinine 1.2.    Past Medical History  Diagnosis Date  . Hypertension   . Arrhythmia     bradycardia  . Thyroid disease     hypothyroidism  . Hyperlipidemia   . Breast cancer   . NSTEMI (non-ST elevated myocardial infarction)     2004 secondary to stress induced cardiomyopathy. Normal LV function by repeatr echo.  . Breast cancer     s/p left mastectomy    Past Surgical History  Procedure Date  . Mastectomy     left  . Cholecystectomy     Current Outpatient Prescriptions  Medication Sig Dispense Refill  . amLODipine (NORVASC) 5 MG tablet Take 5 mg by mouth daily.        Marland Kitchen aspirin 81 MG tablet Take 81 mg by mouth daily.        . benazepril (LOTENSIN) 20 MG tablet Take 20 mg by mouth daily.        .  fenofibrate 160 MG tablet Take 160 mg by mouth daily.        . fexofenadine (ALLEGRA) 180 MG tablet Take 180 mg by mouth daily.        . furosemide (LASIX) 40 MG tablet Take 40 mg by mouth daily.        Marland Kitchen ibuprofen (ADVIL,MOTRIN) 600 MG tablet Take 600 mg by mouth every 6 (six) hours as needed.        . Insulin Aspart (NOVOLOG [place]) Inject into the skin as directed.        . isosorbide mononitrate (IMDUR) 60 MG 24 hr tablet Take 60 mg by mouth daily.        Marland Kitchen levothyroxine (SYNTHROID, LEVOTHROID) 150 MCG tablet Take 150 mcg by mouth daily.        . metFORMIN (GLUCOPHAGE) 500 MG tablet Take 500 mg by mouth 2 (two) times daily with a meal.        . simvastatin (ZOCOR) 40 MG tablet Take 40 mg by mouth at bedtime.        . vitamin D, CHOLECALCIFEROL, 400 UNITS tablet Take 400 Units by mouth daily.          Allergies not on file  History   Social History  . Marital Status: Married    Spouse Name: N/A    Number of Children: N/A  . Years of Education: N/A   Occupational History  . Not on file.  Social History Main Topics  . Smoking status: Unknown If Ever Smoked  . Smokeless tobacco: Not on file  . Alcohol Use: No  . Drug Use: No  . Sexually Active:    Other Topics Concern  . Not on file   Social History Narrative  . No narrative on file    Family History  Problem Relation Age of Onset  . Hypertension      Review of Systems:  As stated in the HPI and otherwise negative.   BP 168/81  Pulse 70  Ht 5' 2.5" (1.588 m)  Wt 176 lb 12.8 oz (80.196 kg)  BMI 31.82 kg/m2  Physical Examination: General: Well developed, well nourished, NAD HEENT: OP clear, mucus membranes moist SKIN: warm, dry. No rashes. Neuro: No focal deficits Musculoskeletal: Muscle strength 5/5 all ext Psychiatric: Mood and affect normal Neck: No JVD, no carotid bruits, no thyromegaly, no lymphadenopathy. Lungs:Clear bilaterally, no wheezes, rhonci, crackles Cardiovascular: Regular rate and rhythm. No  murmurs, gallops or rubs. Abdomen:Soft. Bowel sounds present. Non-tender.  Extremities: No lower extremity edema. Pulses are 2 + in the bilateral DP/PT.  EKG:NSR, rate 71 bpm.

## 2011-07-27 ENCOUNTER — Encounter (HOSPITAL_COMMUNITY): Payer: Self-pay

## 2011-07-27 ENCOUNTER — Ambulatory Visit (HOSPITAL_COMMUNITY)
Admission: RE | Admit: 2011-07-27 | Discharge: 2011-07-27 | Disposition: A | Discharge status: Home / Self Care | Payer: Medicare Other | Source: Ambulatory Visit | Attending: Family Medicine | Admitting: Family Medicine

## 2011-07-27 ENCOUNTER — Other Ambulatory Visit: Payer: Self-pay | Admitting: Family Medicine

## 2011-07-27 DIAGNOSIS — C50919 Malignant neoplasm of unspecified site of unspecified female breast: Secondary | ICD-10-CM | POA: Insufficient documentation

## 2011-07-27 DIAGNOSIS — R1032 Left lower quadrant pain: Secondary | ICD-10-CM | POA: Insufficient documentation

## 2011-07-27 DIAGNOSIS — K5732 Diverticulitis of large intestine without perforation or abscess without bleeding: Secondary | ICD-10-CM | POA: Insufficient documentation

## 2011-07-27 DIAGNOSIS — R197 Diarrhea, unspecified: Secondary | ICD-10-CM | POA: Insufficient documentation

## 2011-07-27 DIAGNOSIS — K5792 Diverticulitis of intestine, part unspecified, without perforation or abscess without bleeding: Secondary | ICD-10-CM

## 2011-07-27 HISTORY — DX: Diverticulosis of large intestine without perforation or abscess without bleeding: K57.30

## 2011-07-27 LAB — CREATININE, SERUM: GFR calc non Af Amer: 37 mL/min — ABNORMAL LOW (ref >60)

## 2011-07-27 MED ORDER — IOHEXOL 300 MG/ML  SOLN
80.0000 mL | Freq: Once | INTRAMUSCULAR | Status: AC | PRN
  Administered 2011-07-27: 80 mL via INTRAVENOUS

## 2012-01-04 ENCOUNTER — Telehealth: Payer: Self-pay | Admitting: *Deleted

## 2012-01-04 DIAGNOSIS — E785 Hyperlipidemia, unspecified: Secondary | ICD-10-CM

## 2012-01-04 MED ORDER — SIMVASTATIN 20 MG PO TABS: 20.0000 mg | ORAL_TABLET | Freq: Every day | ORAL | Status: DC

## 2012-01-04 NOTE — Telephone Encounter (Signed)
Pt on Norvasc 10 mg daily and Zocor 40 mg daily. Dr. Clifton James recommends pt decrease Zocor to 20 mg daily.  I called and gave pt this information.  Will send new prescription to [facility] in [place] per her request.

## 2012-07-17 ENCOUNTER — Other Ambulatory Visit: Payer: Self-pay | Admitting: Cardiovascular Disease

## 2012-09-16 ENCOUNTER — Ambulatory Visit: Payer: Medicare Other | Admitting: Cardiovascular Disease

## 2012-10-04 ENCOUNTER — Ambulatory Visit: Payer: Medicare Other | Admitting: Cardiovascular Disease

## 2012-10-31 ENCOUNTER — Ambulatory Visit: Payer: Medicare Other | Admitting: Cardiovascular Disease

## 2012-12-17 ENCOUNTER — Ambulatory Visit: Payer: Medicare Other | Admitting: Cardiovascular Disease

## 2012-12-18 ENCOUNTER — Encounter: Payer: Self-pay | Admitting: Cardiovascular Disease

## 2012-12-18 ENCOUNTER — Ambulatory Visit (INDEPENDENT_AMBULATORY_CARE_PROVIDER_SITE_OTHER): Payer: Medicare Other | Admitting: Cardiovascular Disease

## 2012-12-18 VITALS — BP 133/65 | HR 75 | Ht 63.0 in | Wt 172.8 lb

## 2012-12-18 DIAGNOSIS — I10 Essential (primary) hypertension: Secondary | ICD-10-CM

## 2012-12-18 DIAGNOSIS — I5181 Takotsubo syndrome: Secondary | ICD-10-CM

## 2012-12-18 NOTE — Patient Instructions (Addendum)
Your physician wants you to follow-up in:  12 months.  You will receive a reminder letter in the mail two months in advance. If you don't receive a letter, please call our office to schedule the follow-up appointment.   

## 2012-12-18 NOTE — Progress Notes (Signed)
History of Present Illness: [name] is a pleasant 67 year old Caucasian female with a history of diabetes mellitus, hypertension, hyperlipidemia, hypothyroidism, depression, chronic back pain, and a remote non-ST elevation myocardial infarction with left ventricular wall motion abnormality in 2004. It was felt at that time to be related to stress induced cardiomyopathy. Her heart catheterization was in February 2004 and showed no obstructive CAD. Repeat echocardiogram later showed resolution of her left ventricular dysfunction. Dr. Diona Browner did have her undergo a myocardial perfusion stress study in January 2009, which showed no evidence of myocardial ischemia and normal contractility of the left ventricle. She was last seen in our office 06/28/11. Echo 06/28/11 with normal LV function and no valvular issues.   She is here today for routine scheduled cardiac follow up. She denies any chest pain with exertion, shortness of breath, palpitations, dizziness, near syncope, syncope, orthopnea, PND or change in her chronic lower extremity swelling.  Primary Care Physician: Paulene Floor, FNP ([facility])  Last Lipid Profile: Followed in primary care.  Past Medical History  Diagnosis Date  . Hypertension   . Arrhythmia     bradycardia  . Thyroid disease     hypothyroidism  . Hyperlipidemia   . NSTEMI (non-ST elevated myocardial infarction)     2004 secondary to stress induced cardiomyopathy. Normal LV function by repeatr echo.  . Diabetes mellitus   . Diverticulosis of colon   . Breast cancer   . Breast cancer     s/p left mastectomy    Past Surgical History  Procedure Date  . Mastectomy     left  . Cholecystectomy     Current Outpatient Prescriptions  Medication Sig Dispense Refill  . amLODipine (NORVASC) 10 MG tablet TAKE ONE TABLET BY MOUTH ONE TIME DAILY  30 tablet  13  . aspirin 81 MG tablet Take 81 mg by mouth daily.        . benazepril (LOTENSIN) 20 MG  tablet Take 20 mg by mouth daily.        . fexofenadine (ALLEGRA) 180 MG tablet Take 180 mg by mouth daily.        . furosemide (LASIX) 40 MG tablet Take 40 mg by mouth daily.        Marland Kitchen ibuprofen (ADVIL,MOTRIN) 600 MG tablet Take 600 mg by mouth every 6 (six) hours as needed.        . Insulin Aspart (NOVOLOG [place]) Inject into the skin as directed.        . isosorbide mononitrate (IMDUR) 60 MG 24 hr tablet Take 60 mg by mouth daily.        Marland Kitchen levothyroxine (SYNTHROID, LEVOTHROID) 150 MCG tablet Take 150 mcg by mouth daily.        . metFORMIN (GLUCOPHAGE) 500 MG tablet Take 500 mg by mouth 2 (two) times daily with a meal.        . simvastatin (ZOCOR) 20 MG tablet Take 40 mg by mouth at bedtime.      . vitamin D, CHOLECALCIFEROL, 400 UNITS tablet Take 400 Units by mouth daily.          No Known Allergies  History   Social History  . Marital Status: Married    Spouse Name: N/A    Number of Children: N/A  . Years of Education: N/A   Occupational History  . Not on file.   Social History Main Topics  . Smoking status: Never Smoker   . Smokeless tobacco: Never Used  .  Alcohol Use: No  . Drug Use: No  . Sexually Active: Not on file   Other Topics Concern  . Not on file   Social History Narrative  . No narrative on file    Family History  Problem Relation Age of Onset  . Hypertension      Review of Systems:  As stated in the HPI and otherwise negative.   BP 133/65  Pulse 75  Ht 5\' 3"  (1.6 m)  Wt 172 lb 12.8 oz (78.382 kg)  BMI 30.61 kg/m2  Physical Examination: General: Well developed, well nourished, NAD HEENT: OP clear, mucus membranes moist SKIN: warm, dry. No rashes. Neuro: No focal deficits Musculoskeletal: Muscle strength 5/5 all ext Psychiatric: Mood and affect normal Neck: No JVD, no carotid bruits, no thyromegaly, no lymphadenopathy. Lungs:Clear bilaterally, no wheezes, rhonci, crackles Cardiovascular: Regular rate and rhythm. No murmurs, gallops or  rubs. Abdomen:Soft. Bowel sounds present. Non-tender.  Extremities: No lower extremity edema. Pulses are 2 + in the bilateral DP/PT.  EKG: NSR, rate 79 bpm.   Echo 06/28/11: Left ventricle: The cavity size was normal. Wall thickness was normal. Systolic function was normal. The estimated ejection fraction was in the range of 55% to 60%. Wall motion was normal; there were no regional wall motion abnormalities. Doppler parameters are consistent with abnormal left ventricular relaxation (grade 1 diastolic dysfunction).    Assessment and Plan:   1. CARDIOMYOPATHY, SECONDARY: Stable. LV function normal by echo 06/28/11.Marland Kitchen No changes. Continue current therapy. ? Stress induced CM in past.   2. HYPERTENSION:  BP is well controlled.

## 2013-02-06 ENCOUNTER — Encounter: Payer: Self-pay | Admitting: *Deleted

## 2013-02-08 ENCOUNTER — Other Ambulatory Visit: Payer: Self-pay | Admitting: Nurse Practitioner

## 2013-03-15 ENCOUNTER — Other Ambulatory Visit: Payer: Self-pay | Admitting: Nurse Practitioner

## 2013-03-17 ENCOUNTER — Ambulatory Visit (INDEPENDENT_AMBULATORY_CARE_PROVIDER_SITE_OTHER): Payer: Medicare Other | Admitting: Nurse Practitioner

## 2013-03-17 ENCOUNTER — Encounter: Payer: Self-pay | Admitting: Nurse Practitioner

## 2013-03-17 ENCOUNTER — Ambulatory Visit: Payer: Self-pay | Admitting: Nurse Practitioner

## 2013-03-17 VITALS — BP 118/68 | HR 91 | Temp 98.2°F | Ht 62.0 in | Wt 174.0 lb

## 2013-03-17 DIAGNOSIS — I252 Old myocardial infarction: Secondary | ICD-10-CM

## 2013-03-17 DIAGNOSIS — I10 Essential (primary) hypertension: Secondary | ICD-10-CM

## 2013-03-17 DIAGNOSIS — Z23 Encounter for immunization: Secondary | ICD-10-CM

## 2013-03-17 DIAGNOSIS — E119 Type 2 diabetes mellitus without complications: Secondary | ICD-10-CM | POA: Insufficient documentation

## 2013-03-17 DIAGNOSIS — E039 Hypothyroidism, unspecified: Secondary | ICD-10-CM

## 2013-03-17 DIAGNOSIS — E785 Hyperlipidemia, unspecified: Secondary | ICD-10-CM

## 2013-03-17 LAB — COMPLETE METABOLIC PANEL WITH GFR
Albumin: 4.3 g/dL (ref 3.5–5.2)
BUN: 24 mg/dL — ABNORMAL HIGH (ref 6–23)
Calcium: 10.2 mg/dL (ref 8.4–10.5)
Chloride: 103 mEq/L (ref 96–112)
GFR, Est African American: 44 mL/min — ABNORMAL LOW
GFR, Est Non African American: 38 mL/min — ABNORMAL LOW
Glucose, Bld: 141 mg/dL — ABNORMAL HIGH (ref 70–99)
Potassium: 4.6 mEq/L (ref 3.5–5.3)

## 2013-03-17 LAB — THYROID PANEL WITH TSH: Free Thyroxine Index: 4.9 — ABNORMAL HIGH (ref 1.0–3.9)

## 2013-03-17 MED ORDER — PNEUMOCOCCAL VAC POLYVALENT 25 MCG/0.5ML IJ INJ: 0.5000 mL | INJECTION | INTRAMUSCULAR | Status: AC

## 2013-03-17 MED ORDER — LEVOTHYROXINE SODIUM 112 MCG PO TABS: 112.0000 ug | ORAL_TABLET | Freq: Every day | ORAL | Status: DC

## 2013-03-17 MED ORDER — ISOSORBIDE MONONITRATE ER 60 MG PO TB24: 60.0000 mg | ORAL_TABLET | Freq: Every day | ORAL | Status: DC

## 2013-03-17 NOTE — Progress Notes (Signed)
Subjective:    Patient ID: Brianna Weber, female    DOB: January 15, 1946, 67 y.o.   MRN: [id number]  Hypertension This is a chronic problem. The current episode started more than 1 year ago. The problem is unchanged. The problem is controlled. Pertinent negatives include no blurred vision, chest pain, headaches, palpitations, peripheral edema or shortness of breath. There are no associated agents to hypertension. Risk factors for coronary artery disease include diabetes mellitus, dyslipidemia, sedentary lifestyle and post-menopausal state. Past treatments include ACE inhibitors and diuretics. The current treatment provides significant improvement. Compliance problems include diet and exercise.  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  Hyperlipidemia This is a chronic problem. The current episode started more than 1 year ago. The problem is controlled. Recent lipid tests were reviewed and are normal. Exacerbating diseases include diabetes and obesity. Pertinent negatives include no chest pain or shortness of breath. Current antihyperlipidemic treatment includes statins. The current treatment provides significant improvement of lipids. Compliance problems include adherence to diet and adherence to exercise.  Risk factors for coronary artery disease include diabetes mellitus, hypertension, post-menopausal and obesity.  Diabetes She presents for her follow-up diabetic visit. She has type 2 diabetes mellitus. The initial diagnosis of diabetes was made 20 years ago. Her disease course has been stable. There are no hypoglycemic associated symptoms. Pertinent negatives for hypoglycemia include no headaches. Pertinent negatives for diabetes include no blurred vision, no chest pain, no foot paresthesias, no polydipsia, no polyphagia, no polyuria, no visual change, no weakness and no weight loss. There are no hypoglycemic complications. Symptoms are stable. Diabetic complications include heart  disease. Risk factors for coronary artery disease include dyslipidemia, hypertension, obesity, post-menopausal and sedentary lifestyle. Current diabetic treatment includes oral agent (monotherapy). She is compliant with treatment all of the time. Her weight is stable. When asked about meal planning, she reported none. She has not had a previous visit with a dietician. She rarely participates in exercise. There is no change in her home blood glucose trend. Her breakfast blood glucose is taken between 8-9 am. Her breakfast blood glucose range is generally 110-130 mg/dl. Her overall blood glucose range is 110-130 mg/dl. An ACE inhibitor/angiotensin II receptor blocker is being taken. She does not see a podiatrist.Eye exam current: several years.  Hypothyroidism Levothyroxin- No C/O fatigue or hair loss Peripheral edema Only has occasionally. Lasix works well to keep under control Hx MI Imddur daily- No C/O chest pain or SOB   Review of Systems  Constitutional: Negative for weight loss.  Eyes: Negative for blurred vision.  Respiratory: Negative for shortness of breath.   Cardiovascular: Negative for chest pain and palpitations.  Endocrine: Negative for polydipsia, polyphagia and polyuria.  Neurological: Negative for weakness and headaches.  All other systems reviewed and are negative.       Objective:   Physical Exam  Constitutional: She is oriented to person, place, and time. She appears well-developed and well-nourished.  HENT:  Nose: Nose normal.  Mouth/Throat: Oropharynx is clear and moist.  Eyes: EOM are normal.  Neck: Trachea normal, normal range of motion and full passive range of motion without pain. Neck supple. No JVD present. Carotid bruit is not present. No thyromegaly present.  Cardiovascular: Normal rate, regular rhythm, normal heart sounds and intact distal pulses.  Exam reveals no gallop and no friction rub.   No murmur heard. Pulmonary/Chest: Effort normal and breath sounds  normal.  Abdominal: Soft. Bowel sounds are normal. She exhibits no distension and no mass.  There is no tenderness.  Musculoskeletal: Normal range of motion.  Lymphadenopathy:    She has no cervical adenopathy.  Neurological: She is alert and oriented to person, place, and time. She has normal reflexes.  Skin: Skin is warm and dry.  Psychiatric: She has a normal mood and affect. Her behavior is normal. Judgment and thought content normal.   BP 118/68  Pulse 91  Temp(Src) 98.2 F (36.8 C) (Oral)  Ht 5\' 2"  (1.575 m)  Wt 174 lb (78.926 kg)  BMI 31.82 kg/m2 Results for orders placed in visit on 03/17/13  POCT GLYCOSYLATED HEMOGLOBIN (HGB A1C)      Result Value Range   Hemoglobin A1C 6.9            Assessment & Plan:  1. HYPOTHYROIDISM  - Thyroid Panel With TSH - levothyroxine (SYNTHROID, LEVOTHROID) 112 MCG tablet; Take 1 tablet (112 mcg total) by mouth daily.  Dispense: 30 tablet; Refill: 5  2. HYPERLIPIDEMIA Low fat diet and exercise - COMPLETE METABOLIC PANEL WITH GFR - NMR Lipoprofile with Lipids  3. HYPERTENSION Low Na+ diet - COMPLETE METABOLIC PANEL WITH GFR - NMR Lipoprofile with Lipids  4. Diabetes Continue low carb diet  Encourage exercise - POCT glycosylated hemoglobin (Hb A1C)   5. History of MI (myocardial infarction)  - isosorbide mononitrate (IMDUR) 60 MG 24 hr tablet; Take 1 tablet (60 mg total) by mouth daily.  Dispense: 60 tablet; Refill: 5  Mary-Margaret Daphine Deutscher, FNP

## 2013-03-17 NOTE — Patient Instructions (Addendum)
Health Maintenance, Females A healthy lifestyle and preventative care can promote health and wellness.  Maintain regular health, dental, and eye exams.  Eat a healthy diet. Foods like vegetables, fruits, whole grains, low-fat dairy products, and lean protein foods contain the nutrients you need without too many calories. Decrease your intake of foods high in solid fats, added sugars, and salt. Get information about a proper diet from your caregiver, if necessary.  Regular physical exercise is one of the most important things you can do for your health. Most adults should get at least 150 minutes of moderate-intensity exercise (any activity that increases your heart rate and causes you to sweat) each week. In addition, most adults need muscle-strengthening exercises on 2 or more days a week.   Maintain a healthy weight. The body mass index (BMI) is a screening tool to identify possible weight problems. It provides an estimate of body fat based on height and weight. Your caregiver can help determine your BMI, and can help you achieve or maintain a healthy weight. For adults 20 years and older:  A BMI below 18.5 is considered underweight.  A BMI of 18.5 to 24.9 is normal.  A BMI of 25 to 29.9 is considered overweight.  A BMI of 30 and above is considered obese.  Maintain normal blood lipids and cholesterol by exercising and minimizing your intake of saturated fat. Eat a balanced diet with plenty of fruits and vegetables. Blood tests for lipids and cholesterol should begin at age 20 and be repeated every 5 years. If your lipid or cholesterol levels are high, you are over 50, or you are a high risk for heart disease, you may need your cholesterol levels checked more frequently.Ongoing high lipid and cholesterol levels should be treated with medicines if diet and exercise are not effective.  If you smoke, find out from your caregiver how to quit. If you do not use tobacco, do not start.  If you  are pregnant, do not drink alcohol. If you are breastfeeding, be very cautious about drinking alcohol. If you are not pregnant and choose to drink alcohol, do not exceed 1 drink per day. One drink is considered to be 12 ounces (355 mL) of beer, 5 ounces (148 mL) of wine, or 1.5 ounces (44 mL) of liquor.  Avoid use of street drugs. Do not share needles with anyone. Ask for help if you need support or instructions about stopping the use of drugs.  High blood pressure causes heart disease and increases the risk of stroke. Blood pressure should be checked at least every 1 to 2 years. Ongoing high blood pressure should be treated with medicines, if weight loss and exercise are not effective.  If you are 55 to 65 years old, ask your caregiver if you should take aspirin to prevent strokes.  Diabetes screening involves taking a blood sample to check your fasting blood sugar level. This should be done once every 3 years, after age 45, if you are within normal weight and without risk factors for diabetes. Testing should be considered at a younger age or be carried out more frequently if you are overweight and have at least 1 risk factor for diabetes.  Breast cancer screening is essential preventative care for women. You should practice "breast self-awareness." This means understanding the normal appearance and feel of your breasts and may include breast self-examination. Any changes detected, no matter how small, should be reported to a caregiver. Women in their 20s and 30s should have   a clinical breast exam (CBE) by a caregiver as part of a regular health exam every 1 to 3 years. After age 40, women should have a CBE every year. Starting at age 40, women should consider having a mammogram (breast X-ray) every year. Women who have a family history of breast cancer should talk to their caregiver about genetic screening. Women at a high risk of breast cancer should talk to their caregiver about having an MRI and a  mammogram every year.  The Pap test is a screening test for cervical cancer. Women should have a Pap test starting at age 21. Between ages 21 and 29, Pap tests should be repeated every 2 years. Beginning at age 30, you should have a Pap test every 3 years as long as the past 3 Pap tests have been normal. If you had a hysterectomy for a problem that was not cancer or a condition that could lead to cancer, then you no longer need Pap tests. If you are between ages 65 and 70, and you have had normal Pap tests going back 10 years, you no longer need Pap tests. If you have had past treatment for cervical cancer or a condition that could lead to cancer, you need Pap tests and screening for cancer for at least 20 years after your treatment. If Pap tests have been discontinued, risk factors (such as a new sexual partner) need to be reassessed to determine if screening should be resumed. Some women have medical problems that increase the chance of getting cervical cancer. In these cases, your caregiver may recommend more frequent screening and Pap tests.  The human papillomavirus (HPV) test is an additional test that may be used for cervical cancer screening. The HPV test looks for the virus that can cause the cell changes on the cervix. The cells collected during the Pap test can be tested for HPV. The HPV test could be used to screen women aged 30 years and older, and should be used in women of any age who have unclear Pap test results. After the age of 30, women should have HPV testing at the same frequency as a Pap test.  Colorectal cancer can be detected and often prevented. Most routine colorectal cancer screening begins at the age of 50 and continues through age 75. However, your caregiver may recommend screening at an earlier age if you have risk factors for colon cancer. On a yearly basis, your caregiver may provide home test kits to check for hidden blood in the stool. Use of a small camera at the end of a  tube, to directly examine the colon (sigmoidoscopy or colonoscopy), can detect the earliest forms of colorectal cancer. Talk to your caregiver about this at age 50, when routine screening begins. Direct examination of the colon should be repeated every 5 to 10 years through age 75, unless early forms of pre-cancerous polyps or small growths are found.  Hepatitis C blood testing is recommended for all people born from 1945 through 1965 and any individual with known risks for hepatitis C.  Practice safe sex. Use condoms and avoid high-risk sexual practices to reduce the spread of sexually transmitted infections (STIs). Sexually active women aged 25 and younger should be checked for Chlamydia, which is a common sexually transmitted infection. Older women with new or multiple partners should also be tested for Chlamydia. Testing for other STIs is recommended if you are sexually active and at increased risk.  Osteoporosis is a disease in which the   bones lose minerals and strength with aging. This can result in serious bone fractures. The risk of osteoporosis can be identified using a bone density scan. Women ages 80 and over and women at risk for fractures or osteoporosis should discuss screening with their caregivers. Ask your caregiver whether you should be taking a calcium supplement or vitamin D to reduce the rate of osteoporosis.  Menopause can be associated with physical symptoms and risks. Hormone replacement therapy is available to decrease symptoms and risks. You should talk to your caregiver about whether hormone replacement therapy is right for you.  Use sunscreen with a sun protection factor (SPF) of 30 or greater. Apply sunscreen liberally and repeatedly throughout the day. You should seek shade when your shadow is shorter than you. Protect yourself by wearing long sleeves, pants, a wide-brimmed hat, and sunglasses year round, whenever you are outdoors.  Notify your caregiver of new moles or  changes in moles, especially if there is a change in shape or color. Also notify your caregiver if a mole is larger than the size of a pencil eraser.  Stay current with your immunizations. Document Released: 05/15/2011 Document Revised: 01/22/2012 Document Reviewed: 05/15/2011 [facility] Patient Information 2013 [place], [place]. Pneumococcal Polysaccharide Vaccine What You Need to Know PNEUMOCOCCAL DISEASE Pneumococcal disease is caused by Streptococcus pneumoniae bacteria. It is a leading cause of vaccine-preventable illness and death in the [place]. Anyone can get pneumococcal disease, but some people are at greater risk than others:  People 68 and older.  The very young.  People with certain health problems.  People with a weakened immune system.  Smokers. Pneumococcal disease can lead to serious infections of the:  Lungs (pneumonia).  Blood (bacteremia).  Covering of the brain (meningitis). Pneumococcal pneumonia kills about 1 out of 20 people who get it. Bacteremia kills about 1 person in 5, and meningitis about 3 people in 10.  PNEUMOCOCCAL POLYSACCHARIDE VACCINE (PPSV) Treatment of pneumococcal infections with penicillin and other drugs used to be more effective. However, some strains of the disease have become resistant to these drugs. This makes prevention of the disease, through vaccination, even more important. Pneumococcal polysaccharide vaccine (PPSV) protects against 23 types of pneumococcal bacteria, including those most likely to cause serious disease. Most healthy adults who get the vaccine develop protection to most or all of these types within 2 to 3 weeks of getting the shot. Very old people, children under 58 years of age, and people with some long-term illnesses might not respond as well, or at all. Another type of pneumococcal vaccine (pneumococcal conjugate vaccine, or PCV) is routinely recommended for children younger than 47 years of age.  WHO SHOULD GET  PPSV?  All adults 68 years of age or older.  Anyone 2 through 65 years of age who has a long-term health problem, such as:  Heart disease.  Lung disease.  Sickle cell disease.  Diabetes.  Alcoholism.  Cirrhosis.  Leaks of cerebrospinal fluid.  Cochlear implant.  Anyone 2 through 65 years of age who has a disease or condition that lowers the body's resistance to infection, such as:  Hodgkin's disease.  Lymphoma or leukemia.  Kidney failure.  Multiple myeloma.  Nephrotic syndrome.  HIV infection or AIDS.  Damaged spleen or no spleen.  Organ transplant.  Anyone 2 through 65 years of age who is taking a drug or treatment that lowers the body's resistance to infection, such as:  Long-term steroids.  Certain cancer drugs.  Radiation therapy.  Any  adult 6 through 65 years of age who:  Is a smoker.  Has asthma. PPSV may be less effective for some people, especially those with lower resistance to infection. However, these people should still be vaccinated because they are more likely to have serious complications if they get pneumococcal disease. Children who often get ear infections, sinus infections, or other upper respiratory diseases, but who are otherwise healthy, do not need to get PPSV because it is not effective against those conditions. HOW MANY DOSES OF PPSV ARE NEEDED, AND WHEN? Usually only 1 dose of PPSV is needed, but under some circumstances a second dose may be given.  A second dose is recommended for people age 71 and older who got their first dose when they were younger than 92 if 5 or more years have passed since the first dose.  A second dose is recommended for people 2 through 65 years of age who:  Have a damaged spleen or no spleen.  Have sickle cell disease.  Have HIV infection or AIDS.  Have cancer, leukemia, lymphoma, or multiple myeloma.  Have nephrotic syndrome.  Have had an organ or bone marrow transplant.  Are taking  medicine that lowers immunity (such as chemotherapy or long-term steroids). When a second dose is given, it should be given 5 years after the first dose. SOME PEOPLE SHOULD NOT GET PPSV OR SHOULD WAIT  Anyone who has had a life-threatening allergic reaction to PPSV should not get another dose.  Anyone who has a severe allergy to any component of a vaccine should not get that vaccine. Tell your caregiver if you have any severe allergies.  Anyone who is moderately or severely ill when the shot is scheduled may be asked to wait until they recover before getting the vaccine. Someone with a mild illness can usually be vaccinated.  While there is no evidence that PPSV is harmful to either a pregnant woman or to her fetus, as a precaution, women with conditions that put them at risk for pneumococcal disease should be vaccinated before becoming pregnant, if possible. WHAT ARE THE RISKS FROM PPSV?  About half of the people who get PPSV have mild side effects, such as redness or pain where the shot is given.  Less than 1% develop a fever, muscle aches, or more severe local reactions.  A vaccine, like any medicine, can cause a serious reaction. However, the risk of a vaccine causing serious harm, or death, is extremely small. WHAT IF THERE IS A SEVERE REACTION? What should I look for? Any unusual condition, such as a high fever or behavior changes. Signs of a severe allergic reaction can include difficulty breathing, hoarseness or wheezing, hives, paleness, weakness, a fast heartbeat, or dizziness. What should I do?  Call a caregiver, or get the person to a caregiver right away.  Tell your caregiver what happened, the date and time it happened, and when the vaccination was given.  Ask your caregiver to report the reaction by filing a Vaccine Adverse Event Reporting System (VAERS) form. Or, you can file this report through the VAERS website at www.vaers.LAgents.no or by calling 1-959-140-7713. VAERS  does not provide medical advice. HOW CAN I LEARN MORE?  Ask your caregiver. He or she can give you the vaccine package insert or suggest other sources of information.  Call your local or state health department.  Contact the Centers for Disease Control and Prevention (CDC):  Call 317-074-7701 (1-800-CDC-INFO).  Visit the CDC website at PicCapture.uy CDC Pneumococcal  Polysaccharide Vaccine VIS (08/18/08) Document Released: 08/27/2006 Document Revised: 01/22/2012 Document Reviewed: 08/18/2008 [facility] Patient Information 2013 [place], [place].

## 2013-03-19 ENCOUNTER — Other Ambulatory Visit: Payer: Self-pay | Admitting: Nurse Practitioner

## 2013-03-19 LAB — NMR LIPOPROFILE WITH LIPIDS
HDL Size: 9.3 nm (ref >=9.2)
LDL Particle Number: 690 nmol/L (ref <1000)
Large VLDL-P: 10.8 nmol/L — ABNORMAL HIGH (ref <=2.7)
Small LDL Particle Number: 140 nmol/L (ref <=527)
VLDL Size: 54 nm — ABNORMAL HIGH (ref <=46.6)

## 2013-03-19 MED ORDER — LEVOTHYROXINE SODIUM 100 MCG PO TABS: 100.0000 ug | ORAL_TABLET | Freq: Every day | ORAL | Status: DC

## 2013-03-24 ENCOUNTER — Telehealth: Payer: Self-pay | Admitting: Nurse Practitioner

## 2013-03-24 NOTE — Telephone Encounter (Signed)
SPOKE WITH PATIENT AND WAS NOTIFIED OF LAB RESULTS WHICH WAS WHY HER THYROID MEDICATION TO DECREASE.

## 2013-03-26 ENCOUNTER — Other Ambulatory Visit: Payer: Self-pay | Admitting: Obstetrics and Gynecology

## 2013-03-31 ENCOUNTER — Other Ambulatory Visit: Payer: Self-pay

## 2013-03-31 MED ORDER — SIMVASTATIN 20 MG PO TABS: 40.0000 mg | ORAL_TABLET | Freq: Every day | ORAL | Status: DC

## 2013-04-09 ENCOUNTER — Telehealth: Payer: Self-pay | Admitting: Nurse Practitioner

## 2013-04-09 MED ORDER — GLUCOSE BLOOD VI STRP: ORAL_STRIP | Status: DC

## 2013-04-09 NOTE — Telephone Encounter (Signed)
Pt needs new rx sent to [facility] with DX, times testiing and directions for her strips, see her phone request . She was last seen 11/04/12 and has cancelled her last 2 appts.

## 2013-04-09 NOTE — Telephone Encounter (Signed)
rx sent to pharmacy

## 2013-04-09 NOTE — Telephone Encounter (Signed)
Pt aware at [facility]

## 2013-05-02 ENCOUNTER — Other Ambulatory Visit: Payer: Self-pay | Admitting: Nurse Practitioner

## 2013-05-08 ENCOUNTER — Other Ambulatory Visit: Payer: Self-pay | Admitting: Obstetrics and Gynecology

## 2013-06-03 ENCOUNTER — Other Ambulatory Visit: Payer: Self-pay | Admitting: Nurse Practitioner

## 2013-06-16 ENCOUNTER — Ambulatory Visit: Payer: Self-pay | Admitting: Nurse Practitioner

## 2013-06-30 ENCOUNTER — Encounter: Payer: Self-pay | Admitting: Nurse Practitioner

## 2013-06-30 ENCOUNTER — Ambulatory Visit (INDEPENDENT_AMBULATORY_CARE_PROVIDER_SITE_OTHER): Payer: Medicare Other | Admitting: Nurse Practitioner

## 2013-06-30 VITALS — BP 118/63 | HR 62 | Temp 98.9°F | Ht 62.0 in | Wt 172.5 lb

## 2013-06-30 DIAGNOSIS — I252 Old myocardial infarction: Secondary | ICD-10-CM

## 2013-06-30 DIAGNOSIS — E119 Type 2 diabetes mellitus without complications: Secondary | ICD-10-CM

## 2013-06-30 DIAGNOSIS — I10 Essential (primary) hypertension: Secondary | ICD-10-CM

## 2013-06-30 DIAGNOSIS — E785 Hyperlipidemia, unspecified: Secondary | ICD-10-CM

## 2013-06-30 DIAGNOSIS — E039 Hypothyroidism, unspecified: Secondary | ICD-10-CM

## 2013-06-30 LAB — POCT GLYCOSYLATED HEMOGLOBIN (HGB A1C): Hemoglobin A1C: 6.7

## 2013-06-30 LAB — POCT UA - MICROALBUMIN: Microalbumin Ur, POC: NEGATIVE mg/L

## 2013-06-30 NOTE — Progress Notes (Signed)
Subjective:    Patient ID: Brianna Weber, female    DOB: 02-Nov-1946, 67 y.o.   MRN: [id number]  Hypertension This is a chronic problem. The current episode started more than 1 year ago. The problem is unchanged. The problem is controlled. Pertinent negatives include no blurred vision, chest pain, headaches, palpitations, peripheral edema or shortness of breath. There are no associated agents to hypertension. Risk factors for coronary artery disease include diabetes mellitus, dyslipidemia, sedentary lifestyle and post-menopausal state. Past treatments include ACE inhibitors and diuretics. The current treatment provides significant improvement. Compliance problems include diet and exercise.  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  Hyperlipidemia This is a chronic problem. The current episode started more than 1 year ago. The problem is controlled. Recent lipid tests were reviewed and are normal. Exacerbating diseases include diabetes and obesity. Pertinent negatives include no chest pain or shortness of breath. Current antihyperlipidemic treatment includes statins. The current treatment provides significant improvement of lipids. Compliance problems include adherence to diet and adherence to exercise.  Risk factors for coronary artery disease include diabetes mellitus, hypertension, post-menopausal and obesity.  Diabetes She presents for her follow-up diabetic visit. She has type 2 diabetes mellitus. The initial diagnosis of diabetes was made 20 years ago. Her disease course has been stable. There are no hypoglycemic associated symptoms. Pertinent negatives for hypoglycemia include no headaches. Pertinent negatives for diabetes include no blurred vision, no chest pain, no foot paresthesias, no polydipsia, no polyphagia, no polyuria, no visual change, no weakness and no weight loss. There are no hypoglycemic complications. Symptoms are stable. Diabetic complications include heart  disease. Risk factors for coronary artery disease include dyslipidemia, hypertension, obesity, post-menopausal and sedentary lifestyle. Current diabetic treatment includes oral agent (monotherapy). She is compliant with treatment all of the time. Her weight is stable. When asked about meal planning, she reported none. She has not had a previous visit with a dietician. She rarely participates in exercise. There is no change in her home blood glucose trend. Her breakfast blood glucose is taken between 8-9 am. Her breakfast blood glucose range is generally 110-130 mg/dl. Her overall blood glucose range is 110-130 mg/dl. An ACE inhibitor/angiotensin II receptor blocker is being taken. She does not see a podiatrist.Eye exam current: several years.  Hypothyroidism Levothyroxin- No C/O fatigue or hair loss Peripheral edema Only has occasionally. Lasix works well to keep under control Hx MI Imddur daily- No C/O chest pain or SOB   Review of Systems  Constitutional: Negative for weight loss.  Eyes: Negative for blurred vision.  Respiratory: Negative for shortness of breath.   Cardiovascular: Negative for chest pain and palpitations.  Endocrine: Negative for polydipsia, polyphagia and polyuria.  Neurological: Negative for weakness and headaches.  All other systems reviewed and are negative.       Objective:   Physical Exam  Constitutional: She is oriented to person, place, and time. She appears well-developed and well-nourished.  HENT:  Nose: Nose normal.  Mouth/Throat: Oropharynx is clear and moist.  Eyes: EOM are normal.  Neck: Trachea normal, normal range of motion and full passive range of motion without pain. Neck supple. No JVD present. Carotid bruit is not present. No thyromegaly present.  Cardiovascular: Normal rate, regular rhythm, normal heart sounds and intact distal pulses.  Exam reveals no gallop and no friction rub.   No murmur heard. Pulmonary/Chest: Effort normal and breath sounds  normal.  Abdominal: Soft. Bowel sounds are normal. She exhibits no distension and no mass.  There is no tenderness.  Musculoskeletal: Normal range of motion.  Lymphadenopathy:    She has no cervical adenopathy.  Neurological: She is alert and oriented to person, place, and time. She has normal reflexes.  Skin: Skin is warm and dry.  Psychiatric: She has a normal mood and affect. Her behavior is normal. Judgment and thought content normal.   BP 118/63  Pulse 62  Temp(Src) 98.9 F (37.2 C) (Oral)  Ht 5\' 2"  (1.575 m)  Wt 172 lb 8 oz (78.245 kg)  BMI 31.54 kg/m2 Results for orders placed in visit on 06/30/13  POCT GLYCOSYLATED HEMOGLOBIN (HGB A1C)      Result Value Range   Hemoglobin A1C 6.7            Assessment & Plan:   1. Unspecified hypothyroidism   2. Other and unspecified hyperlipidemia   3. Unspecified essential hypertension   4. Type II or unspecified type diabetes mellitus without mention of complication, not stated as uncontrolled   5. History of MI (myocardial infarction)    Orders Placed This Encounter  Procedures  . CMP14+EGFR  . NMR, lipoprofile  . POCT glycosylated hemoglobin (Hb A1C)  . POCT UA - Microalbumin   Outpatient Encounter Prescriptions as of 06/30/2013  Medication Sig Dispense Refill  . amLODipine (NORVASC) 10 MG tablet TAKE ONE TABLET BY MOUTH ONE TIME DAILY  30 tablet  13  . aspirin 81 MG tablet Take 81 mg by mouth daily.        . benazepril (LOTENSIN) 20 MG tablet TAKE ONE TABLET BY MOUTH ONCE DAILY  30 tablet  3  . fexofenadine (ALLEGRA) 180 MG tablet Take 180 mg by mouth daily.        . furosemide (LASIX) 40 MG tablet TAKE ONE TABLET BY MOUTH IN THE MORNING  30 tablet  3  . glucose blood test strip Patient test blood sugar 3-4X/day  Dx 250.02  100 each  12  . ibuprofen (ADVIL,MOTRIN) 600 MG tablet Take 600 mg by mouth every 6 (six) hours as needed.        . isosorbide mononitrate (IMDUR) 60 MG 24 hr tablet Take 1 tablet (60 mg total) by  mouth daily.  60 tablet  5  . levothyroxine (SYNTHROID, LEVOTHROID) 100 MCG tablet Take 1 tablet (100 mcg total) by mouth daily.  90 tablet  1  . metFORMIN (GLUCOPHAGE) 500 MG tablet TAKE ONE TABLET BY MOUTH TWICE DAILY  60 tablet  0  . NOVOLIN 70/30 RELION (70-30) 100 UNIT/ML injection       . simvastatin (ZOCOR) 20 MG tablet Take 2 tablets (40 mg total) by mouth daily.  30 tablet  5  . vitamin D, CHOLECALCIFEROL, 400 UNITS tablet Take 400 Units by mouth daily.         No facility-administered encounter medications on file as of 06/30/2013.   Continue all meds Labs pending Health maintenance reviewed  Mary-Margaret Daphine Deutscher, FNP

## 2013-06-30 NOTE — Patient Instructions (Signed)

## 2013-07-02 LAB — CMP14+EGFR
ALT: 23 IU/L (ref 0–32)
AST: 21 IU/L (ref 0–40)
Alkaline Phosphatase: 115 IU/L (ref 39–117)
BUN/Creatinine Ratio: 22 (ref 11–26)
CO2: 23 mmol/L (ref 18–29)
Calcium: 10.4 mg/dL — ABNORMAL HIGH (ref 8.6–10.2)
Chloride: 104 mmol/L (ref 97–108)
Globulin, Total: 3 g/dL (ref 1.5–4.5)
Potassium: 6 mmol/L — ABNORMAL HIGH (ref 3.5–5.2)
Sodium: 141 mmol/L (ref 134–144)

## 2013-07-02 LAB — NMR, LIPOPROFILE
HDL Cholesterol by NMR: 59 mg/dL (ref >=40)
HDL Particle Number: 36.9 umol/L (ref >=30.5)
LDL Particle Number: 989 nmol/L (ref <1000)
LDLC SERPL CALC-MCNC: 65 mg/dL (ref <100)
LP-IR Score: 63 — ABNORMAL HIGH (ref <=45)

## 2013-07-05 ENCOUNTER — Telehealth: Payer: Self-pay | Admitting: Nurse Practitioner

## 2013-07-07 NOTE — Telephone Encounter (Signed)
Mmm we do not have 70/30 samples but we do have humalog 75/25- can we try this since she cant afford to buy any

## 2013-07-07 NOTE — Telephone Encounter (Signed)
Ok if we have samples

## 2013-07-08 NOTE — Telephone Encounter (Signed)
She came by office and was given some samples yesterday. Thankyou.

## 2013-07-14 ENCOUNTER — Other Ambulatory Visit: Payer: Self-pay | Admitting: Nurse Practitioner

## 2013-07-17 ENCOUNTER — Telehealth: Payer: Self-pay | Admitting: Nurse Practitioner

## 2013-07-22 ENCOUNTER — Other Ambulatory Visit: Payer: Self-pay | Admitting: *Deleted

## 2013-07-22 MED ORDER — INSULIN NPH ISOPHANE & REGULAR (70-30) 100 UNIT/ML ~~LOC~~ SUSP: SUBCUTANEOUS | Status: DC

## 2013-07-22 NOTE — Telephone Encounter (Signed)
Called in correct amt.

## 2013-08-06 ENCOUNTER — Other Ambulatory Visit: Payer: Self-pay | Admitting: *Deleted

## 2013-08-06 MED ORDER — METFORMIN HCL 500 MG PO TABS: 500.0000 mg | ORAL_TABLET | Freq: Two times a day (BID) | ORAL | Status: DC

## 2013-08-23 ENCOUNTER — Other Ambulatory Visit: Payer: Self-pay | Admitting: Cardiovascular Disease

## 2013-09-02 ENCOUNTER — Other Ambulatory Visit: Payer: Self-pay

## 2013-09-02 MED ORDER — FUROSEMIDE 40 MG PO TABS: 40.0000 mg | ORAL_TABLET | Freq: Every day | ORAL | Status: DC

## 2013-09-15 ENCOUNTER — Other Ambulatory Visit: Payer: Self-pay | Admitting: *Deleted

## 2013-09-15 MED ORDER — LEVOTHYROXINE SODIUM 100 MCG PO TABS: 100.0000 ug | ORAL_TABLET | Freq: Every day | ORAL | Status: DC

## 2013-09-15 MED ORDER — BENAZEPRIL HCL 20 MG PO TABS: 20.0000 mg | ORAL_TABLET | Freq: Every day | ORAL | Status: DC

## 2013-09-17 ENCOUNTER — Telehealth: Payer: Self-pay | Admitting: Nurse Practitioner

## 2013-09-18 NOTE — Telephone Encounter (Signed)
Copy of med list mailed.

## 2013-09-23 ENCOUNTER — Other Ambulatory Visit: Payer: Self-pay

## 2013-09-23 MED ORDER — SIMVASTATIN 20 MG PO TABS: 40.0000 mg | ORAL_TABLET | Freq: Every day | ORAL | Status: DC

## 2013-10-24 ENCOUNTER — Other Ambulatory Visit: Payer: Self-pay | Admitting: Family Medicine

## 2013-10-27 ENCOUNTER — Telehealth: Payer: Self-pay | Admitting: Nurse Practitioner

## 2013-10-27 NOTE — Telephone Encounter (Signed)
NTBS.

## 2013-10-27 NOTE — Telephone Encounter (Signed)
Last seen 06/30/13  MMM last glucose 06/30/13

## 2013-10-31 ENCOUNTER — Encounter (INDEPENDENT_AMBULATORY_CARE_PROVIDER_SITE_OTHER): Payer: Self-pay

## 2013-10-31 ENCOUNTER — Ambulatory Visit (INDEPENDENT_AMBULATORY_CARE_PROVIDER_SITE_OTHER): Payer: Medicare Other | Admitting: Nurse Practitioner

## 2013-10-31 ENCOUNTER — Encounter: Payer: Self-pay | Admitting: Nurse Practitioner

## 2013-10-31 VITALS — BP 117/67 | HR 65 | Temp 98.4°F | Ht 62.0 in | Wt 170.0 lb

## 2013-10-31 DIAGNOSIS — E039 Hypothyroidism, unspecified: Secondary | ICD-10-CM

## 2013-10-31 DIAGNOSIS — E785 Hyperlipidemia, unspecified: Secondary | ICD-10-CM

## 2013-10-31 DIAGNOSIS — I10 Essential (primary) hypertension: Secondary | ICD-10-CM

## 2013-10-31 DIAGNOSIS — I429 Cardiomyopathy, unspecified: Secondary | ICD-10-CM

## 2013-10-31 DIAGNOSIS — E119 Type 2 diabetes mellitus without complications: Secondary | ICD-10-CM

## 2013-10-31 DIAGNOSIS — I498 Other specified cardiac arrhythmias: Secondary | ICD-10-CM

## 2013-10-31 LAB — POCT GLYCOSYLATED HEMOGLOBIN (HGB A1C): Hemoglobin A1C: 6.3

## 2013-10-31 MED ORDER — GLUCOSE BLOOD VI STRP: ORAL_STRIP | Status: DC

## 2013-10-31 NOTE — Progress Notes (Addendum)
Subjective:    Patient ID: SIMISOLA SANDLES, female    DOB: [birth year], 67 y.o.   MRN: [id number]\  Patient here today for follow up - no changes since last visit- no complaints  Hypertension This is a chronic problem. The current episode started more than 1 year ago. The problem is unchanged. The problem is controlled. Pertinent negatives include no blurred vision, chest pain, headaches, palpitations, peripheral edema or shortness of breath. There are no associated agents to hypertension. Risk factors for coronary artery disease include diabetes mellitus, dyslipidemia, sedentary lifestyle and post-menopausal state. Past treatments include ACE inhibitors and diuretics. The current treatment provides significant improvement. Compliance problems include diet and exercise.  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  Hyperlipidemia This is a chronic problem. The current episode started more than 1 year ago. The problem is controlled. Recent lipid tests were reviewed and are normal. Exacerbating diseases include diabetes and obesity. Pertinent negatives include no chest pain or shortness of breath. Current antihyperlipidemic treatment includes statins. The current treatment provides significant improvement of lipids. Compliance problems include adherence to diet and adherence to exercise.  Risk factors for coronary artery disease include diabetes mellitus, hypertension, post-menopausal and obesity.  Diabetes She presents for her follow-up diabetic visit. She has type 2 diabetes mellitus. The initial diagnosis of diabetes was made 20 years ago. Her disease course has been stable. There are no hypoglycemic associated symptoms. Pertinent negatives for hypoglycemia include no headaches. Pertinent negatives for diabetes include no blurred vision, no chest pain, no foot paresthesias, no polydipsia, no polyphagia, no polyuria, no visual change, no weakness and no weight loss. There are no  hypoglycemic complications. Symptoms are stable. Diabetic complications include heart disease. Risk factors for coronary artery disease include dyslipidemia, hypertension, obesity, post-menopausal and sedentary lifestyle. Current diabetic treatment includes oral agent (monotherapy). She is compliant with treatment all of the time. Her weight is stable. When asked about meal planning, she reported none. She has not had a previous visit with a dietician. She rarely participates in exercise. There is no change in her home blood glucose trend. Her breakfast blood glucose is taken between 8-9 am. Her breakfast blood glucose range is generally 110-130 mg/dl. Her overall blood glucose range is 110-130 mg/dl. An ACE inhibitor/angiotensin II receptor blocker is being taken. She does not see a podiatrist.Eye exam current: several years.  Hypothyroidism Levothyroxin- No C/O fatigue or hair loss Peripheral edema Only has occasionally. Lasix works well to keep under control Hx MI Imddur daily- No C/O chest pain or SOB   Review of Systems  Constitutional: Negative for weight loss.  Eyes: Negative for blurred vision.  Respiratory: Negative for shortness of breath.   Cardiovascular: Negative for chest pain and palpitations.  Endocrine: Negative for polydipsia, polyphagia and polyuria.  Neurological: Negative for weakness and headaches.  All other systems reviewed and are negative.       Objective:   Physical Exam  Constitutional: She is oriented to person, place, and time. She appears well-developed and well-nourished.  HENT:  Nose: Nose normal.  Mouth/Throat: Oropharynx is clear and moist.  Eyes: EOM are normal.  Neck: Trachea normal, normal range of motion and full passive range of motion without pain. Neck supple. No JVD present. Carotid bruit is not present. No thyromegaly present.  Cardiovascular: Normal rate, regular rhythm and intact distal pulses.  Exam reveals no gallop and no friction rub.    Murmur (2/6 systolic) heard. Pulmonary/Chest: Effort normal and breath sounds normal.  Abdominal: Soft. Bowel sounds are normal. She exhibits no distension and no mass. There is no tenderness.  Musculoskeletal: Normal range of motion.  Lymphadenopathy:    She has no cervical adenopathy.  Neurological: She is alert and oriented to person, place, and time. She has normal reflexes.  Skin: Skin is warm and dry.  Psychiatric: She has a normal mood and affect. Her behavior is normal. Judgment and thought content normal.   BP 117/67  Pulse 65  Temp(Src) 98.4 F (36.9 C) (Oral)  Ht 5\' 2"  (1.575 m)  Wt 170 lb (77.111 kg)  BMI 31.09 kg/m2 Results for orders placed in visit on 10/31/13  POCT GLYCOSYLATED HEMOGLOBIN (HGB A1C)      Result Value Range   Hemoglobin A1C 6.3            Assessment & Plan:   1. Type II or unspecified type diabetes mellitus without mention of complication, not stated as uncontrolled   2. HYPERTENSION   3. HYPERLIPIDEMIA   4. HYPOTHYROIDISM   5. BRADYCARDIA   6. CARDIOMYOPATHY, SECONDARY    Orders Placed This Encounter  Procedures  . CMP14+EGFR  . NMR, lipoprofile  . Thyroid Panel With TSH  . POCT glycosylated hemoglobin (Hb A1C)   Meds ordered this encounter  Medications  . glucose blood (ONE TOUCH ULTRA TEST) test strip    Sig: Test 2X per day an dprn-   Insulin dependent-- 250.02    Dispense:  400 each    Refill:  12    Order Specific Question:  Supervising Provider    Answer:  Ernestina Penna [[id number]]    Continue all meds Labs pending Diet and exercise encouraged Health maintenance reviewed Follow up in 3 months Hemoccult cards given  Mary-Margaret Daphine Deutscher, FNP

## 2013-10-31 NOTE — Patient Instructions (Signed)
Diabetes and Foot Care Diabetes may cause you to have problems because of poor blood supply (circulation) to your feet and legs. This may cause the skin on your feet to become thinner, break easier, and heal more slowly. Your skin may become dry, and the skin may peel and crack. You may also have nerve damage in your legs and feet causing decreased feeling in them. You may not notice minor injuries to your feet that could lead to infections or more serious problems. Taking care of your feet is one of the most important things you can do for yourself.  HOME CARE INSTRUCTIONS  Wear shoes at all times, even in the house. Do not go barefoot. Bare feet are easily injured.  Check your feet daily for blisters, cuts, and redness. If you cannot see the bottom of your feet, use a mirror or ask someone for help.  Wash your feet with warm water (do not use hot water) and mild soap. Then pat your feet and the areas between your toes until they are completely dry. Do not soak your feet as this can dry your skin.  Apply a moisturizing lotion or petroleum jelly (that does not contain alcohol and is unscented) to the skin on your feet and to dry, brittle toenails. Do not apply lotion between your toes.  Trim your toenails straight across. Do not dig under them or around the cuticle. File the edges of your nails with an emery board or nail file.  Do not cut corns or calluses or try to remove them with medicine.  Wear clean socks or stockings every day. Make sure they are not too tight. Do not wear knee-high stockings since they may decrease blood flow to your legs.  Wear shoes that fit properly and have enough cushioning. To break in new shoes, wear them for just a few hours a day. This prevents you from injuring your feet. Always look in your shoes before you put them on to be sure there are no objects inside.  Do not cross your legs. This may decrease the blood flow to your feet.  If you find a minor scrape,  cut, or break in the skin on your feet, keep it and the skin around it clean and dry. These areas may be cleansed with mild soap and water. Do not cleanse the area with peroxide, alcohol, or iodine.  When you remove an adhesive bandage, be sure not to damage the skin around it.  If you have a wound, look at it several times a day to make sure it is healing.  Do not use heating pads or hot water bottles. They may burn your skin. If you have lost feeling in your feet or legs, you may not know it is happening until it is too late.  Make sure your health care provider performs a complete foot exam at least annually or more often if you have foot problems. Report any cuts, sores, or bruises to your health care provider immediately. SEEK MEDICAL CARE IF:   You have an injury that is not healing.  You have cuts or breaks in the skin.  You have an ingrown nail.  You notice redness on your legs or feet.  You feel burning or tingling in your legs or feet.  You have pain or cramps in your legs and feet.  Your legs or feet are numb.  Your feet always feel cold. SEEK IMMEDIATE MEDICAL CARE IF:   There is increasing redness,   swelling, or pain in or around a wound.  There is a red line that goes up your leg.  Pus is coming from a wound.  You develop a fever or as directed by your health care provider.  You notice a bad smell coming from an ulcer or wound. Document Released: 10/27/2000 Document Revised: 07/02/2013 Document Reviewed: 04/08/2013 [employer] Patient Information 2014 [employer].  

## 2013-11-01 LAB — THYROID PANEL WITH TSH: T3 Uptake Ratio: 27 % (ref 24–39)

## 2013-11-01 LAB — CMP14+EGFR
ALT: 14 IU/L (ref 0–32)
Albumin/Globulin Ratio: 1.6 (ref 1.1–2.5)
Albumin: 4.5 g/dL (ref 3.6–4.8)
BUN: 22 mg/dL (ref 8–27)
Calcium: 10.4 mg/dL — ABNORMAL HIGH (ref 8.6–10.2)
Chloride: 99 mmol/L (ref 97–108)
GFR calc Af Amer: 48 mL/min/{1.73_m2} — ABNORMAL LOW (ref >59)
GFR calc non Af Amer: 42 mL/min/{1.73_m2} — ABNORMAL LOW (ref >59)
Glucose: 101 mg/dL — ABNORMAL HIGH (ref 65–99)
Total Bilirubin: 0.4 mg/dL (ref 0.0–1.2)
Total Protein: 7.3 g/dL (ref 6.0–8.5)

## 2013-11-01 LAB — NMR, LIPOPROFILE
Cholesterol: 150 mg/dL (ref <200)
HDL Cholesterol by NMR: 64 mg/dL (ref >=40)
LDL Particle Number: 680 nmol/L (ref <1000)
LDL Size: 21 nm (ref >20.5)
Triglycerides by NMR: 115 mg/dL (ref <150)

## 2013-11-04 MED ORDER — INSULIN NPH ISOPHANE & REGULAR (70-30) 100 UNIT/ML ~~LOC~~ SUSP: SUBCUTANEOUS | Status: DC

## 2013-11-04 NOTE — Telephone Encounter (Signed)
rx sent in 

## 2013-12-05 ENCOUNTER — Telehealth: Payer: Self-pay | Admitting: Nurse Practitioner

## 2013-12-05 MED ORDER — INSULIN NPH ISOPHANE & REGULAR (70-30) 100 UNIT/ML ~~LOC~~ SUSP: SUBCUTANEOUS | Status: DC

## 2013-12-05 NOTE — Telephone Encounter (Signed)
done

## 2013-12-08 ENCOUNTER — Telehealth: Payer: Self-pay | Admitting: Nurse Practitioner

## 2013-12-08 MED ORDER — INSULIN NPH ISOPHANE & REGULAR (70-30) 100 UNIT/ML ~~LOC~~ SUSP: SUBCUTANEOUS | Status: DC

## 2013-12-08 NOTE — Telephone Encounter (Signed)
done

## 2013-12-10 ENCOUNTER — Telehealth: Payer: Self-pay | Admitting: Nurse Practitioner

## 2013-12-10 ENCOUNTER — Other Ambulatory Visit: Payer: Self-pay | Admitting: *Deleted

## 2013-12-10 MED ORDER — INSULIN NPH ISOPHANE & REGULAR (70-30) 100 UNIT/ML ~~LOC~~ SUSP: SUBCUTANEOUS | Status: DC

## 2013-12-18 ENCOUNTER — Other Ambulatory Visit: Payer: Self-pay | Admitting: Cardiovascular Disease

## 2014-01-12 ENCOUNTER — Other Ambulatory Visit: Payer: Self-pay | Admitting: Nurse Practitioner

## 2014-01-12 ENCOUNTER — Other Ambulatory Visit: Payer: Self-pay | Admitting: Family Medicine

## 2014-01-15 ENCOUNTER — Other Ambulatory Visit: Payer: Self-pay | Admitting: Cardiovascular Disease

## 2014-02-23 ENCOUNTER — Other Ambulatory Visit: Payer: Self-pay | Admitting: Cardiovascular Disease

## 2014-02-25 ENCOUNTER — Telehealth: Payer: Self-pay | Admitting: Nurse Practitioner

## 2014-02-26 MED ORDER — AMLODIPINE BESYLATE 10 MG PO TABS: ORAL_TABLET | ORAL | Status: DC

## 2014-02-26 NOTE — Telephone Encounter (Signed)
Can we please give her a 30 day supply until appt

## 2014-03-10 ENCOUNTER — Encounter: Payer: Self-pay | Admitting: Adult Health

## 2014-03-10 ENCOUNTER — Ambulatory Visit (INDEPENDENT_AMBULATORY_CARE_PROVIDER_SITE_OTHER): Payer: Medicare Other | Admitting: Adult Health

## 2014-03-10 VITALS — BP 120/72 | HR 76 | Ht 62.0 in | Wt 173.0 lb

## 2014-03-10 DIAGNOSIS — Z01419 Encounter for gynecological examination (general) (routine) without abnormal findings: Secondary | ICD-10-CM

## 2014-03-10 DIAGNOSIS — Z1212 Encounter for screening for malignant neoplasm of rectum: Secondary | ICD-10-CM

## 2014-03-10 LAB — HEMOCCULT GUIAC POC 1CARD (OFFICE): Fecal Occult Blood, POC: NEGATIVE

## 2014-03-10 NOTE — Patient Instructions (Signed)
Physical in 2 year Mammogram yearly  Colonoscopy as per GI Labs with PCP

## 2014-03-10 NOTE — Progress Notes (Signed)
Patient ID: Brianna Weber, female   DOB: September 02, 1946, 68 y.o.   MRN: [id number] History of Present Illness: Brianna Weber is a 68 year old white female, married in for gyn physical, she is sp hysterectomy and had breast cancer 18 years ago.   Current Medications, Allergies, Past Medical History, Past Surgical History, Family History and Social History were reviewed in [employer] record.   Past Medical History  Diagnosis Date  . Hypertension   . Arrhythmia     bradycardia  . Thyroid disease     hypothyroidism  . Hyperlipidemia   . NSTEMI (non-ST elevated myocardial infarction)     2004 secondary to stress induced cardiomyopathy. Normal LV function by repeatr echo.  . Diabetes mellitus   . Diverticulosis of colon   . Breast cancer   . Breast cancer     s/p left mastectomy  . Allergy   . Peripheral edema   . Myocardial infarct   . Vitamin D deficiency    Past Surgical History  Procedure Laterality Date  . Mastectomy      left  . Cholecystectomy    . Abdominal hysterectomy    Current outpatient prescriptions:amLODipine (NORVASC) 10 MG tablet, TAKE 1 TABLET DAILY, Disp: 15 tablet, Rfl: 0;  aspirin 81 MG tablet, Take 81 mg by mouth daily.  , Disp: , Rfl: ;  benazepril (LOTENSIN) 20 MG tablet, TAKE 1 TABLET ONCE A DAY, Disp: 30 tablet, Rfl: 2;  fexofenadine (ALLEGRA) 180 MG tablet, Take 180 mg by mouth daily.  , Disp: , Rfl: ;  furosemide (LASIX) 40 MG tablet, TAKE 1 TABLET ONCE A DAY, Disp: 30 tablet, Rfl: 2 glucose blood (ONE TOUCH ULTRA TEST) test strip, Test 2X per day an dprn-   Insulin dependent-- 250.02, Disp: 400 each, Rfl: 12;  insulin NPH-regular Human (NOVOLIN 70/30 RELION) (70-30) 100 UNIT/ML injection, Inject 55u in AM and 60 u in evening, Disp: 40 mL, Rfl: 2;  isosorbide mononitrate (IMDUR) 60 MG 24 hr tablet, Take 1 tablet (60 mg total) by mouth daily., Disp: 60 tablet, Rfl: 5 levothyroxine (SYNTHROID, LEVOTHROID) 100 MCG tablet, Take 1 tablet (100 mcg  total) by mouth daily., Disp: 90 tablet, Rfl: 1;  simvastatin (ZOCOR) 20 MG tablet, TAKE 1 TABLET ONCE A DAY, Disp: 30 tablet, Rfl: 2;  vitamin D, CHOLECALCIFEROL, 400 UNITS tablet, Take 400 Units by mouth daily.  , Disp: , Rfl:   Review of Systems: Patient denies any headaches, blurred vision, shortness of breath, chest pain, abdominal pain, problems with bowel movements, she is not having sex and has some UI with her lasix and she drinks a lot of water.No joint swelling or mood swings,She is active and cares for husband.    Physical Exam:BP 120/72  Pulse 76  Ht 5\' 2"  (1.575 m)  Wt 173 lb (78.472 kg)  BMI 31.63 kg/m2 General:  Well developed, well nourished, no acute distress Skin:  Warm and dry Neck:  Midline trachea, normal thyroid, no carotid bruits Lungs; Clear to auscultation bilaterally Breast:  No dominant palpable mass, retraction, or nipple discharge on right,sp mastectomy on left Cardiovascular: Regular rate and rhythm Abdomen:  Soft, non tender, no hepatosplenomegaly Pelvic:  External genitalia is normal in appearance.  The vagina has loss of rugae, color is pale and it has loss of moisture.The cervix and uterus are absent.  No  adnexal masses or tenderness noted. Rectal: Good sphincter tone, no polyps, or hemorrhoids felt.  Hemoccult negative. Extremities:  No swelling or  varicosities noted Psych:  No mood changes, alert and cooperative,seems happy   Impression: Yearly gyn exam no pap History of breast cancer   Plan: Physical in 2 years Needs mammogram now and yearly skipped last year colonoscopy per GI Labs with PCP Follow up with Shelah Lewandowsky next month

## 2014-03-16 ENCOUNTER — Other Ambulatory Visit: Payer: Self-pay | Admitting: Nurse Practitioner

## 2014-03-18 ENCOUNTER — Other Ambulatory Visit: Payer: Self-pay | Admitting: Nurse Practitioner

## 2014-03-27 ENCOUNTER — Ambulatory Visit (INDEPENDENT_AMBULATORY_CARE_PROVIDER_SITE_OTHER): Payer: Medicare Other | Admitting: Nurse Practitioner

## 2014-03-27 ENCOUNTER — Encounter: Payer: Self-pay | Admitting: Nurse Practitioner

## 2014-03-27 VITALS — BP 130/68 | HR 57 | Temp 98.3°F | Ht 62.0 in | Wt 173.2 lb

## 2014-03-27 DIAGNOSIS — E039 Hypothyroidism, unspecified: Secondary | ICD-10-CM

## 2014-03-27 DIAGNOSIS — E119 Type 2 diabetes mellitus without complications: Secondary | ICD-10-CM

## 2014-03-27 DIAGNOSIS — I429 Cardiomyopathy, unspecified: Secondary | ICD-10-CM

## 2014-03-27 DIAGNOSIS — I10 Essential (primary) hypertension: Secondary | ICD-10-CM

## 2014-03-27 DIAGNOSIS — Z13820 Encounter for screening for osteoporosis: Secondary | ICD-10-CM

## 2014-03-27 DIAGNOSIS — I6350 Cerebral infarction due to unspecified occlusion or stenosis of unspecified cerebral artery: Secondary | ICD-10-CM

## 2014-03-27 DIAGNOSIS — E785 Hyperlipidemia, unspecified: Secondary | ICD-10-CM

## 2014-03-27 LAB — POCT GLYCOSYLATED HEMOGLOBIN (HGB A1C): Hemoglobin A1C: 7.1

## 2014-03-27 MED ORDER — ISOSORBIDE MONONITRATE ER 60 MG PO TB24: ORAL_TABLET | ORAL | Status: DC

## 2014-03-27 MED ORDER — LEVOTHYROXINE SODIUM 100 MCG PO TABS: ORAL_TABLET | ORAL | Status: DC

## 2014-03-27 MED ORDER — AMLODIPINE BESYLATE 10 MG PO TABS: ORAL_TABLET | ORAL | Status: DC

## 2014-03-27 MED ORDER — SIMVASTATIN 20 MG PO TABS: ORAL_TABLET | ORAL | Status: DC

## 2014-03-27 MED ORDER — FUROSEMIDE 40 MG PO TABS: ORAL_TABLET | ORAL | Status: DC

## 2014-03-27 MED ORDER — BENAZEPRIL HCL 20 MG PO TABS: ORAL_TABLET | ORAL | Status: DC

## 2014-03-27 MED ORDER — GLUCOSE BLOOD VI STRP: ORAL_STRIP | Status: DC

## 2014-03-27 NOTE — Patient Instructions (Signed)
Diabetes and Foot Care Diabetes may cause you to have problems because of poor blood supply (circulation) to your feet and legs. This may cause the skin on your feet to become thinner, break easier, and heal more slowly. Your skin may become dry, and the skin may peel and crack. You may also have nerve damage in your legs and feet causing decreased feeling in them. You may not notice minor injuries to your feet that could lead to infections or more serious problems. Taking care of your feet is one of the most important things you can do for yourself.  HOME CARE INSTRUCTIONS  Wear shoes at all times, even in the house. Do not go barefoot. Bare feet are easily injured.  Check your feet daily for blisters, cuts, and redness. If you cannot see the bottom of your feet, use a mirror or ask someone for help.  Wash your feet with warm water (do not use hot water) and mild soap. Then pat your feet and the areas between your toes until they are completely dry. Do not soak your feet as this can dry your skin.  Apply a moisturizing lotion or petroleum jelly (that does not contain alcohol and is unscented) to the skin on your feet and to dry, brittle toenails. Do not apply lotion between your toes.  Trim your toenails straight across. Do not dig under them or around the cuticle. File the edges of your nails with an emery board or nail file.  Do not cut corns or calluses or try to remove them with medicine.  Wear clean socks or stockings every day. Make sure they are not too tight. Do not wear knee-high stockings since they may decrease blood flow to your legs.  Wear shoes that fit properly and have enough cushioning. To break in new shoes, wear them for just a few hours a day. This prevents you from injuring your feet. Always look in your shoes before you put them on to be sure there are no objects inside.  Do not cross your legs. This may decrease the blood flow to your feet.  If you find a minor scrape,  cut, or break in the skin on your feet, keep it and the skin around it clean and dry. These areas may be cleansed with mild soap and water. Do not cleanse the area with peroxide, alcohol, or iodine.  When you remove an adhesive bandage, be sure not to damage the skin around it.  If you have a wound, look at it several times a day to make sure it is healing.  Do not use heating pads or hot water bottles. They may burn your skin. If you have lost feeling in your feet or legs, you may not know it is happening until it is too late.  Make sure your health care provider performs a complete foot exam at least annually or more often if you have foot problems. Report any cuts, sores, or bruises to your health care provider immediately. SEEK MEDICAL CARE IF:   You have an injury that is not healing.  You have cuts or breaks in the skin.  You have an ingrown nail.  You notice redness on your legs or feet.  You feel burning or tingling in your legs or feet.  You have pain or cramps in your legs and feet.  Your legs or feet are numb.  Your feet always feel cold. SEEK IMMEDIATE MEDICAL CARE IF:   There is increasing redness,   swelling, or pain in or around a wound.  There is a red line that goes up your leg.  Pus is coming from a wound.  You develop a fever or as directed by your health care provider.  You notice a bad smell coming from an ulcer or wound. Document Released: 10/27/2000 Document Revised: 07/02/2013 Document Reviewed: 04/08/2013 [employer] Patient Information 2014 [employer].  

## 2014-03-27 NOTE — Progress Notes (Signed)
Subjective:    Patient ID: Brianna Weber, female    DOB: 12/16/1945, 68 y.o.   MRN: [id number]\  Patient here today for follow up - no changes since last visit- no complaints  Hypertension This is a chronic problem. The current episode started more than 1 year ago. The problem is unchanged. The problem is controlled. Pertinent negatives include no blurred vision, chest pain, headaches, palpitations, peripheral edema or shortness of breath. There are no associated agents to hypertension. Risk factors for coronary artery disease include diabetes mellitus, dyslipidemia, sedentary lifestyle and post-menopausal state. Past treatments include ACE inhibitors and diuretics. The current treatment provides significant improvement. Compliance problems include diet and exercise.  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  Hyperlipidemia This is a chronic problem. The current episode started more than 1 year ago. The problem is controlled. Recent lipid tests were reviewed and are normal. Exacerbating diseases include diabetes and obesity. Pertinent negatives include no chest pain or shortness of breath. Current antihyperlipidemic treatment includes statins. The current treatment provides significant improvement of lipids. Compliance problems include adherence to diet and adherence to exercise.  Risk factors for coronary artery disease include diabetes mellitus, hypertension, post-menopausal and obesity.  Diabetes She presents for her follow-up diabetic visit. She has type 2 diabetes mellitus. The initial diagnosis of diabetes was made 20 years ago. Her disease course has been stable. There are no hypoglycemic associated symptoms. Pertinent negatives for hypoglycemia include no headaches. Pertinent negatives for diabetes include no blurred vision, no chest pain, no foot paresthesias, no polydipsia, no polyphagia, no polyuria, no visual change, no weakness and no weight loss. There are no  hypoglycemic complications. Symptoms are stable. Diabetic complications include heart disease. Risk factors for coronary artery disease include dyslipidemia, hypertension, obesity, post-menopausal and sedentary lifestyle. Current diabetic treatment includes oral agent (monotherapy). She is compliant with treatment all of the time. Her weight is stable. When asked about meal planning, she reported none. She has not had a previous visit with a dietician. She rarely participates in exercise. There is no change in her home blood glucose trend. Her breakfast blood glucose is taken between 8-9 am. Her breakfast blood glucose range is generally 110-130 mg/dl. Her overall blood glucose range is 110-130 mg/dl. An ACE inhibitor/angiotensin II receptor blocker is being taken. She does not see a podiatrist.Eye exam current: several years.  Hypothyroidism Levothyroxin- No C/O fatigue or hair loss Peripheral edema Only has occasionally. Lasix works well to keep under control Hx MI Imddur daily- No C/O chest pain or SOB   Review of Systems  Constitutional: Negative for weight loss.  Eyes: Negative for blurred vision.  Respiratory: Negative for shortness of breath.   Cardiovascular: Negative for chest pain and palpitations.  Endocrine: Negative for polydipsia, polyphagia and polyuria.  Neurological: Negative for weakness and headaches.  All other systems reviewed and are negative.      Objective:   Physical Exam  Constitutional: She is oriented to person, place, and time. She appears well-developed and well-nourished.  HENT:  Nose: Nose normal.  Mouth/Throat: Oropharynx is clear and moist.  Eyes: EOM are normal.  Neck: Trachea normal, normal range of motion and full passive range of motion without pain. Neck supple. No JVD present. Carotid bruit is not present. No thyromegaly present.  Cardiovascular: Normal rate, regular rhythm and intact distal pulses.  Exam reveals no gallop and no friction rub.    Murmur (2/6 systolic) heard. Pulmonary/Chest: Effort normal and breath sounds normal.  Abdominal:  Soft. Bowel sounds are normal. She exhibits no distension and no mass. There is no tenderness.  Musculoskeletal: Normal range of motion.  Lymphadenopathy:    She has no cervical adenopathy.  Neurological: She is alert and oriented to person, place, and time. She has normal reflexes.  Skin: Skin is warm and dry.  Psychiatric: She has a normal mood and affect. Her behavior is normal. Judgment and thought content normal.   BP 130/68  Pulse 57  Temp(Src) 98.3 F (36.8 C) (Oral)  Ht $R'5\' 2"'iz$  (1.575 m)  Wt 173 lb 3.2 oz (78.563 kg)  BMI 31.67 kg/m2  Results for orders placed in visit on 03/27/14  POCT GLYCOSYLATED HEMOGLOBIN (HGB A1C)      Result Value Ref Range   Hemoglobin A1C 7.1%            Assessment & Plan:   1. HYPOTHYROIDISM   2. HYPERLIPIDEMIA   3. HYPERTENSION   4. Type II or unspecified type diabetes mellitus without mention of complication, not stated as uncontrolled   5. CARDIOMYOPATHY, SECONDARY   6. CVA   7. Screening for osteoporosis    Orders Placed This Encounter  Procedures  . DG Bone Density    Standing Status: Future     Number of Occurrences:      Standing Expiration Date: 05/27/2015    Order Specific Question:  Reason for Exam (SYMPTOM  OR DIAGNOSIS REQUIRED)    Answer:  screening    Order Specific Question:  Preferred imaging location?    Answer:  Internal  . CMP14+EGFR  . NMR, lipoprofile  . Thyroid Panel With TSH  . POCT glycosylated hemoglobin (Hb A1C)   Meds ordered this encounter  Medications  . amLODipine (NORVASC) 10 MG tablet    Sig: TAKE 1 TABLET DAILY    Dispense:  15 tablet    Refill:  0    Let patient know when rx is ready for pick up    Order Specific Question:  Supervising Provider    Answer:  Chipper Herb [[id number]]  . benazepril (LOTENSIN) 20 MG tablet    Sig: TAKE 1 TABLET ONCE A DAY    Dispense:  30 tablet    Refill:  5     Order Specific Question:  Supervising Provider    Answer:  Chipper Herb [[id number]]  . furosemide (LASIX) 40 MG tablet    Sig: TAKE 1 TABLET ONCE A DAY    Dispense:  30 tablet    Refill:  5    Order Specific Question:  Supervising Provider    Answer:  Chipper Herb [[id number]]  . isosorbide mononitrate (IMDUR) 60 MG 24 hr tablet    Sig: TAKE 1 TABLET ONCE A DAY    Dispense:  60 tablet    Refill:  5    Order Specific Question:  Supervising Provider    Answer:  Chipper Herb [[id number]]  . levothyroxine (SYNTHROID, LEVOTHROID) 100 MCG tablet    Sig: TAKE 1 TABLET ONCE A DAY    Dispense:  30 tablet    Refill:  5    Order Specific Question:  Supervising Provider    Answer:  Chipper Herb [[id number]]  . glucose blood (ONE TOUCH ULTRA TEST) test strip    Sig: Test 2X per day an dprn-   Insulin dependent-- 250.02    Dispense:  400 each    Refill:  12    Order Specific Question:  Supervising Provider  Answer:  Chipper Herb [[id number]]  . simvastatin (ZOCOR) 20 MG tablet    Sig: TAKE 1 TABLET ONCE A DAY    Dispense:  30 tablet    Refill:  5    Order Specific Question:  Supervising Provider    Answer:  Joycelyn Man   Ordered dexa Labs pending Health maintenance reviewed Diet and exercise encouraged Continue all meds Follow up  In 3 months  Clare, FNP

## 2014-03-28 LAB — CMP14+EGFR
A/G RATIO: 1.6 (ref 1.1–2.5)
ALT: 19 IU/L (ref 0–32)
AST: 23 IU/L (ref 0–40)
Albumin: 4.4 g/dL (ref 3.6–4.8)
Alkaline Phosphatase: 112 IU/L (ref 39–117)
BILIRUBIN TOTAL: 0.5 mg/dL (ref 0.0–1.2)
BUN/Creatinine Ratio: 16 (ref 11–26)
BUN: 20 mg/dL (ref 8–27)
CO2: 21 mmol/L (ref 18–29)
Calcium: 9.9 mg/dL (ref 8.7–10.3)
Chloride: 101 mmol/L (ref 97–108)
Creatinine, Ser: 1.24 mg/dL — ABNORMAL HIGH (ref 0.57–1.00)
GFR calc Af Amer: 52 mL/min/{1.73_m2} — ABNORMAL LOW (ref >59)
GFR, EST NON AFRICAN AMERICAN: 45 mL/min/{1.73_m2} — AB (ref >59)
GLOBULIN, TOTAL: 2.7 g/dL (ref 1.5–4.5)
GLUCOSE: 178 mg/dL — AB (ref 65–99)
Potassium: 4.5 mmol/L (ref 3.5–5.2)
Sodium: 139 mmol/L (ref 134–144)
TOTAL PROTEIN: 7.1 g/dL (ref 6.0–8.5)

## 2014-03-28 LAB — NMR, LIPOPROFILE
Cholesterol: 162 mg/dL (ref 100–199)
HDL Cholesterol by NMR: 56 mg/dL (ref >39)
HDL Particle Number: 34.2 umol/L (ref >=30.5)
LDL Particle Number: 788 nmol/L (ref <1000)
LDL SIZE: 20.7 nm (ref >20.5)
LDLC SERPL CALC-MCNC: 69 mg/dL (ref 0–99)
LP-IR Score: 47 — ABNORMAL HIGH (ref <=45)
Small LDL Particle Number: 345 nmol/L (ref <=527)
Triglycerides by NMR: 187 mg/dL — ABNORMAL HIGH (ref 0–149)

## 2014-03-28 LAB — THYROID PANEL WITH TSH
FREE THYROXINE INDEX: 2.8 (ref 1.2–4.9)
T3 Uptake Ratio: 29 % (ref 24–39)
T4, Total: 9.8 ug/dL (ref 4.5–12.0)
TSH: 1.77 u[IU]/mL (ref 0.450–4.500)

## 2014-04-15 ENCOUNTER — Other Ambulatory Visit: Payer: Self-pay | Admitting: Nurse Practitioner

## 2014-04-22 ENCOUNTER — Other Ambulatory Visit: Payer: Self-pay | Admitting: Nurse Practitioner

## 2014-06-18 ENCOUNTER — Telehealth: Payer: Self-pay | Admitting: Pharmacist

## 2014-06-18 NOTE — Telephone Encounter (Signed)
error 

## 2014-06-25 ENCOUNTER — Telehealth: Payer: Self-pay | Admitting: Family Medicine

## 2014-06-25 NOTE — Telephone Encounter (Signed)
Patient had received some Novolog u-100 insulin from a family member and wanted to know if she could use this in place of Novolin 70/30.  These insulins are not equivalent and I recommended that she not use Novolog u-100 and continue Novolin 70/30

## 2014-07-08 ENCOUNTER — Other Ambulatory Visit: Payer: Medicare Other

## 2014-07-08 ENCOUNTER — Ambulatory Visit: Payer: Medicare Other

## 2014-07-10 ENCOUNTER — Ambulatory Visit: Payer: Medicare Other

## 2014-09-14 ENCOUNTER — Encounter: Payer: Self-pay | Admitting: Nurse Practitioner

## 2014-09-16 ENCOUNTER — Ambulatory Visit: Payer: Medicare Other | Admitting: Nurse Practitioner

## 2014-09-16 ENCOUNTER — Ambulatory Visit (INDEPENDENT_AMBULATORY_CARE_PROVIDER_SITE_OTHER): Payer: Medicare Other | Admitting: Nurse Practitioner

## 2014-09-16 ENCOUNTER — Encounter: Payer: Self-pay | Admitting: Nurse Practitioner

## 2014-09-16 VITALS — BP 132/71 | HR 75 | Temp 99.2°F | Ht 62.0 in | Wt 169.2 lb

## 2014-09-16 DIAGNOSIS — I10 Essential (primary) hypertension: Secondary | ICD-10-CM

## 2014-09-16 DIAGNOSIS — I639 Cerebral infarction, unspecified: Secondary | ICD-10-CM

## 2014-09-16 DIAGNOSIS — E034 Atrophy of thyroid (acquired): Secondary | ICD-10-CM

## 2014-09-16 DIAGNOSIS — R609 Edema, unspecified: Secondary | ICD-10-CM | POA: Insufficient documentation

## 2014-09-16 DIAGNOSIS — E038 Other specified hypothyroidism: Secondary | ICD-10-CM

## 2014-09-16 DIAGNOSIS — Z23 Encounter for immunization: Secondary | ICD-10-CM

## 2014-09-16 DIAGNOSIS — I6350 Cerebral infarction due to unspecified occlusion or stenosis of unspecified cerebral artery: Secondary | ICD-10-CM

## 2014-09-16 DIAGNOSIS — E785 Hyperlipidemia, unspecified: Secondary | ICD-10-CM

## 2014-09-16 DIAGNOSIS — R600 Localized edema: Secondary | ICD-10-CM | POA: Insufficient documentation

## 2014-09-16 DIAGNOSIS — E119 Type 2 diabetes mellitus without complications: Secondary | ICD-10-CM

## 2014-09-16 LAB — POCT UA - MICROALBUMIN: Microalbumin Ur, POC: NEGATIVE mg/L

## 2014-09-16 LAB — POCT GLYCOSYLATED HEMOGLOBIN (HGB A1C): Hemoglobin A1C: 7.2

## 2014-09-16 MED ORDER — INSULIN NPH ISOPHANE & REGULAR (70-30) 100 UNIT/ML ~~LOC~~ SUSP: SUBCUTANEOUS | Status: DC

## 2014-09-16 NOTE — Progress Notes (Signed)
   Subjective:    Patient ID: Brianna Weber, female    DOB: [birth year], 68 y.o.   MRN: [id number]  HPI patient in today for follow up : No complaints today Hypertension Currently on benazepril $RemoveBefor'20mg'[id number]$  and amlodipine$RemoveBefore'10mg'wByGrSoliIzgM$ - working well- no c/o cough or side effects hyperlipidemia On zocor- last labs looked good- no c/o muscle aches- not compliant on diet and exercise Cardiomyopathy Currently on imdur- no c/o chest pain- needs to follow up with cardiologist- patient will make appointment. Peripheral edema Taking lasix which keeps swelling down in lower ext- no SOB hypothyroidism On synthroid 16mcg daily- no c/o fatigue Diabetes On novalin 70/30 BID- will change to Humulin 70/30 the first of the year- fasting blood sugars are running aroun 120-140. Brianna Weber doses not check every day. Not watching diet- no eye exam in over 2 years  Review of Systems  Constitutional: Negative.   HENT: Negative.   Respiratory: Negative.   Cardiovascular: Negative.   Gastrointestinal: Negative.   Genitourinary: Negative.   Neurological: Negative.   Psychiatric/Behavioral: Negative.   All other systems reviewed and are negative.      Objective:   Physical Exam  Constitutional: Brianna Weber is oriented to person, place, and time. Brianna Weber appears well-developed and well-nourished.  HENT:  Nose: Nose normal.  Mouth/Throat: Oropharynx is clear and moist.  Eyes: EOM are normal.  Neck: Trachea normal, normal range of motion and full passive range of motion without pain. Neck supple. No JVD present. Carotid bruit is not present. No thyromegaly present.  Cardiovascular: Normal rate, regular rhythm, normal heart sounds and intact distal pulses.  Exam reveals no gallop and no friction rub.   No murmur heard. Pulmonary/Chest: Effort normal and breath sounds normal.  Abdominal: Soft. Bowel sounds are normal. Brianna Weber exhibits no distension and no mass. There is no tenderness.  Musculoskeletal: Normal range of motion. Brianna Weber exhibits  edema (1+ edema bil).  Lymphadenopathy:    Brianna Weber has no cervical adenopathy.  Neurological: Brianna Weber is alert and oriented to person, place, and time. Brianna Weber has normal reflexes.  Skin: Skin is warm and dry.  Psychiatric: Brianna Weber has a normal mood and affect. Brianna Weber behavior is normal. Judgment and thought content normal.   BP 132/71 mmHg  Pulse 75  Temp(Src) 99.2 F (37.3 C) (Oral)  Ht $R'5\' 2"'Is$  (1.575 m)  Wt 169 lb 3.2 oz (76.749 kg)  BMI 30.94 kg/m2   Results for orders placed or performed in visit on 09/16/14  POCT glycosylated hemoglobin (Hb A1C)  Result Value Ref Range   Hemoglobin A1C 7.2%            Assessment & Plan:  1. Essential hypertension Low NA+ diet - CMP14+EGFR  2. Cerebral artery occlusion with cerebral infarction Daily ASA  3. Type 2 diabetes mellitus without complication Count carbs - POCT glycosylated hemoglobin (Hb A1C) - POCT UA - Microalbumin  4. Hypothyroidism due to acquired atrophy of thyroid - Thyroid Panel With TSH  5. Hyperlipidemia with target LDL less than 100 Low fat diet - NMR, lipoprofile  6. Peripheral edema Elevate legs when sitting   Flu shot today Labs pending Health maintenance reviewed Diet and exercise encouraged Continue all meds Follow up  In 3 months   [place], FNP

## 2014-09-16 NOTE — Patient Instructions (Signed)

## 2014-09-17 ENCOUNTER — Ambulatory Visit: Payer: Medicare Other

## 2014-09-17 LAB — THYROID PANEL WITH TSH
Free Thyroxine Index: 2.5 (ref 1.2–4.9)
T3 UPTAKE RATIO: 28 % (ref 24–39)
T4 TOTAL: 9 ug/dL (ref 4.5–12.0)
TSH: 1.55 u[IU]/mL (ref 0.450–4.500)

## 2014-09-17 LAB — CMP14+EGFR
A/G RATIO: 1.6 (ref 1.1–2.5)
ALT: 18 IU/L (ref 0–32)
AST: 21 IU/L (ref 0–40)
Albumin: 4.3 g/dL (ref 3.6–4.8)
Alkaline Phosphatase: 103 IU/L (ref 39–117)
BUN/Creatinine Ratio: 18 (ref 11–26)
BUN: 23 mg/dL (ref 8–27)
CALCIUM: 10.2 mg/dL (ref 8.7–10.3)
CHLORIDE: 100 mmol/L (ref 97–108)
CO2: 22 mmol/L (ref 18–29)
Creatinine, Ser: 1.27 mg/dL — ABNORMAL HIGH (ref 0.57–1.00)
GFR, EST AFRICAN AMERICAN: 51 mL/min/{1.73_m2} — AB (ref >59)
GFR, EST NON AFRICAN AMERICAN: 44 mL/min/{1.73_m2} — AB (ref >59)
GLUCOSE: 152 mg/dL — AB (ref 65–99)
Globulin, Total: 2.7 g/dL (ref 1.5–4.5)
Potassium: 4.6 mmol/L (ref 3.5–5.2)
Sodium: 139 mmol/L (ref 134–144)
TOTAL PROTEIN: 7 g/dL (ref 6.0–8.5)
Total Bilirubin: 0.3 mg/dL (ref 0.0–1.2)

## 2014-09-17 LAB — NMR, LIPOPROFILE
Cholesterol: 164 mg/dL (ref 100–199)
HDL Cholesterol by NMR: 62 mg/dL (ref >39)
HDL Particle Number: 37.8 umol/L (ref >=30.5)
LDL PARTICLE NUMBER: 722 nmol/L (ref <1000)
LDL SIZE: 20.8 nm (ref >20.5)
LDL-C: 69 mg/dL (ref 0–99)
LP-IR Score: 51 — ABNORMAL HIGH (ref <=45)
Small LDL Particle Number: 271 nmol/L (ref <=527)
TRIGLYCERIDES BY NMR: 164 mg/dL — AB (ref 0–149)

## 2014-09-21 ENCOUNTER — Other Ambulatory Visit: Payer: Self-pay | Admitting: Nurse Practitioner

## 2014-10-06 ENCOUNTER — Other Ambulatory Visit: Payer: Self-pay | Admitting: Nurse Practitioner

## 2014-10-12 ENCOUNTER — Other Ambulatory Visit: Payer: Self-pay | Admitting: Nurse Practitioner

## 2014-10-23 ENCOUNTER — Other Ambulatory Visit: Payer: Self-pay | Admitting: Nurse Practitioner

## 2014-10-30 ENCOUNTER — Ambulatory Visit: Payer: Medicare Other | Admitting: Nurse Practitioner

## 2014-11-17 ENCOUNTER — Other Ambulatory Visit: Payer: Self-pay | Admitting: Family Medicine

## 2014-12-23 ENCOUNTER — Ambulatory Visit: Payer: Medicare Other | Admitting: Nurse Practitioner

## 2014-12-25 ENCOUNTER — Encounter: Payer: Self-pay | Admitting: Family Medicine

## 2014-12-25 ENCOUNTER — Ambulatory Visit (INDEPENDENT_AMBULATORY_CARE_PROVIDER_SITE_OTHER): Payer: Medicare Other | Admitting: Family Medicine

## 2014-12-25 VITALS — BP 134/66 | HR 68 | Temp 97.6°F | Ht 62.0 in | Wt 174.8 lb

## 2014-12-25 DIAGNOSIS — E119 Type 2 diabetes mellitus without complications: Secondary | ICD-10-CM

## 2014-12-25 DIAGNOSIS — E038 Other specified hypothyroidism: Secondary | ICD-10-CM | POA: Diagnosis not present

## 2014-12-25 DIAGNOSIS — Z23 Encounter for immunization: Secondary | ICD-10-CM

## 2014-12-25 DIAGNOSIS — E785 Hyperlipidemia, unspecified: Secondary | ICD-10-CM

## 2014-12-25 DIAGNOSIS — I10 Essential (primary) hypertension: Secondary | ICD-10-CM

## 2014-12-25 DIAGNOSIS — E034 Atrophy of thyroid (acquired): Secondary | ICD-10-CM

## 2014-12-25 LAB — POCT CBC
Granulocyte percent: 66.1 %G (ref 37–80)
HEMATOCRIT: 40 % (ref 37.7–47.9)
Hemoglobin: 12.1 g/dL — AB (ref 12.2–16.2)
Lymph, poc: 2.2 (ref 0.6–3.4)
MCH: 24.6 pg — AB (ref 27–31.2)
MCHC: 30.3 g/dL — AB (ref 31.8–35.4)
MCV: 81.3 fL (ref 80–97)
MPV: 9.3 fL (ref 0–99.8)
POC Granulocyte: 5.5 (ref 2–6.9)
POC LYMPH %: 26.9 % (ref 10–50)
Platelet Count, POC: 342 10*3/uL (ref 142–424)
RBC: 4.9 M/uL (ref 4.04–5.48)
RDW, POC: 13.8 %
WBC: 8.3 10*3/uL (ref 4.6–10.2)

## 2014-12-25 LAB — POCT GLYCOSYLATED HEMOGLOBIN (HGB A1C): Hemoglobin A1C: 7.2

## 2014-12-25 MED ORDER — INSULIN NPH ISOPHANE & REGULAR (70-30) 100 UNIT/ML ~~LOC~~ SUSP: SUBCUTANEOUS | Status: DC

## 2014-12-25 MED ORDER — FUROSEMIDE 40 MG PO TABS: 40.0000 mg | ORAL_TABLET | Freq: Every day | ORAL | Status: DC

## 2014-12-25 MED ORDER — BENAZEPRIL HCL 20 MG PO TABS: 20.0000 mg | ORAL_TABLET | Freq: Every day | ORAL | Status: DC

## 2014-12-25 MED ORDER — LEVOTHYROXINE SODIUM 100 MCG PO TABS: 100.0000 ug | ORAL_TABLET | Freq: Every day | ORAL | Status: DC

## 2014-12-25 NOTE — Progress Notes (Signed)
Subjective:  Patient ID: Brianna Weber, female    DOB: Jul 02, 1946  Age: 69 y.o. MRN: [id number]  CC: Diabetes; Hypertension; Hyperlipidemia; and Hypothyroidism   HPI Brianna Weber presents for Patient in for follow-up of hypertension. Patient has no history of headache chest pain or shortness of breath or recent cough. Patient also denies symptoms of TIA such as numbness weakness lateralizing. Patient checks  blood pressure at home and has not had any elevated readings recently. Patient denies side effects from his medication. States taking it regularly. Patient does check blood sugar at home readings starting in December after Christmas show fasting to be as high as 218. This continued through about with January 6. At that time when she ran out of Christmas leftovers her sugar started doing much better and through most of the month of January her fasting sugar was endrange with exception of a couple that were in the 160-175 range later in the month. So far in February the fasting sugar has run between 105 and 158. Patient denies symptoms such as polyuria, polydipsia, excessive hunger, nausea and she is overall following a diabetic diet but admits to cheating through the holidays. She says she loves graham cracker high and ice cream and tends to have one or both of those on a regular basis sometimes daily. No significant hypoglycemic spells noted. Medications as noted below. Taking them regularly without complication/adverse reaction being reported today.   Patient presents for follow-up on  thyroid. She has a history of hypothyroidism for many years. It has been stable recently. Pt. denies any change in  voice, loss of hair, heat or cold intolerance. Energy level has been adequate to good. She denies constipation and diarrhea. No myxedema. Medication is as noted below. Verified that pt is taking it daily on an empty stomach. Well tolerated.  Patient in for follow-up of elevated  cholesterol. Doing well without complaints on current medication. Denies side effects of statin including myalgia and arthralgia and nausea. Also in today for liver function testing. Currently no chest pain, shortness of breath or other cardiovascular related symptoms noted. History Brianna Weber has a past medical history of Hypertension; Arrhythmia; Thyroid disease; Hyperlipidemia; NSTEMI (non-ST elevated myocardial infarction); Diabetes mellitus; Diverticulosis of colon; Breast cancer; Breast cancer; Allergy; Peripheral edema; Myocardial infarct; and Vitamin D deficiency.   She has past surgical history that includes Mastectomy; Cholecystectomy; and Abdominal hysterectomy.   Her family history includes Cancer in her maternal aunt; Diabetes in her daughter; Heart attack in her father; Heart disease in her brother, brother, maternal aunt, maternal grandmother, maternal uncle, mother, sister, sister, and sister; Hyperlipidemia in her maternal aunt and maternal uncle; Hypertension in her maternal aunt, maternal uncle, mother, and another family member.She reports that she has never smoked. She has never used smokeless tobacco. She reports that she does not drink alcohol or use illicit drugs.  Current Outpatient Prescriptions on File Prior to Visit  Medication Sig Dispense Refill  . amLODipine (NORVASC) 10 MG tablet TAKE 1 TABLET DAILY 30 tablet 5  . aspirin 81 MG tablet Take 81 mg by mouth daily.      . fexofenadine (ALLEGRA) 180 MG tablet Take 180 mg by mouth daily.      Marland Kitchen glucose blood (ONE TOUCH ULTRA TEST) test strip Test 2X per day an dprn-   Insulin dependent-- 250.02 400 each 12  . isosorbide mononitrate (IMDUR) 60 MG 24 hr tablet TAKE 1 TABLET ONCE A DAY 60 tablet 5  . simvastatin (  ZOCOR) 20 MG tablet TAKE 1 TABLET ONCE A DAY 30 tablet 4  . vitamin D, CHOLECALCIFEROL, 400 UNITS tablet Take 400 Units by mouth daily.       No current facility-administered medications on file prior to visit.     ROS Review of Systems  Constitutional: Negative for fever, chills, diaphoresis, appetite change, fatigue and unexpected weight change.  HENT: Negative for congestion, ear pain, hearing loss, postnasal drip, rhinorrhea, sneezing, sore throat and trouble swallowing.   Eyes: Negative for pain.  Respiratory: Negative for cough, chest tightness and shortness of breath.   Cardiovascular: Negative for chest pain and palpitations.  Gastrointestinal: Negative for nausea, vomiting, abdominal pain, diarrhea and constipation.  Genitourinary: Negative for dysuria, frequency and menstrual problem.  Musculoskeletal: Negative for joint swelling and arthralgias.  Skin: Negative for rash.  Neurological: Negative for dizziness, weakness, numbness and headaches.  Psychiatric/Behavioral: Negative for dysphoric mood and agitation.    Objective:  BP 134/66 mmHg  Pulse 68  Temp(Src) 97.6 F (36.4 C) (Oral)  Ht 5' 2" (1.575 m)  Wt 174 lb 12.8 oz (79.289 kg)  BMI 31.96 kg/m2  BP Readings from Last 3 Encounters:  12/25/14 134/66  09/16/14 132/71  03/27/14 130/68    Wt Readings from Last 3 Encounters:  12/25/14 174 lb 12.8 oz (79.289 kg)  09/16/14 169 lb 3.2 oz (76.749 kg)  03/27/14 173 lb 3.2 oz (78.563 kg)     Physical Exam  Constitutional: She is oriented to person, place, and time. She appears well-developed and well-nourished. No distress.  HENT:  Head: Normocephalic and atraumatic.  Right Ear: External ear normal.  Left Ear: External ear normal.  Nose: Nose normal.  Mouth/Throat: Oropharynx is clear and moist.  Eyes: Conjunctivae and EOM are normal. Pupils are equal, round, and reactive to light.  Neck: Normal range of motion. Neck supple. No thyromegaly present.  Cardiovascular: Normal rate, regular rhythm and normal heart sounds.   No murmur heard. Pulmonary/Chest: Effort normal and breath sounds normal. No respiratory distress. She has no wheezes. She has no rales.  Abdominal:  Soft. Bowel sounds are normal. She exhibits no distension. There is no tenderness.  Lymphadenopathy:    She has no cervical adenopathy.  Neurological: She is alert and oriented to person, place, and time. She has normal reflexes.  Skin: Skin is warm and dry.  Psychiatric: She has a normal mood and affect. Her behavior is normal. Judgment and thought content normal.    Lab Results  Component Value Date   HGBA1C 7.2% 09/16/2014   HGBA1C 7.1% 03/27/2014   HGBA1C 6.3 10/31/2013    Lab Results  Component Value Date   GLUCOSE 152* 09/16/2014   CHOL 164 09/16/2014   TRIG 164* 09/16/2014   HDL 62 09/16/2014   LDLCALC 69 03/27/2014   ALT 18 09/16/2014   AST 21 09/16/2014   NA 139 09/16/2014   K 4.6 09/16/2014   CL 100 09/16/2014   CREATININE 1.27* 09/16/2014   BUN 23 09/16/2014   CO2 22 09/16/2014   TSH 1.550 09/16/2014   HGBA1C 7.2% 09/16/2014    Ct Abdomen Pelvis W Contrast  07/27/2011   *RADIOLOGY REPORT*  Clinical Data: Left lower quadrant pain.  Diarrhea. Diverticulosis.  Breast carcinoma.  CT ABDOMEN AND PELVIS WITH CONTRAST  Technique:  Multidetector CT imaging of the abdomen and pelvis was performed following the standard protocol during bolus administration of intravenous contrast.  Contrast: 80mL OMNIPAQUE IOHEXOL 300 MG/ML IV SOLN  Comparison: 03/26/2007    Findings: Moderate to severe diverticulosis is seen involving majority of the colon.  There is mild wall thickening and pericolonic inflammatory change involving the distal sigmoid colon. This is consistent with sigmoid diverticulitis.  There is no evidence of abscess or free fluid.  No evidence of bowel obstruction.  Previous hysterectomy noted.  No soft tissue masses are seen within the abdomen or pelvis.  No evidence of lymphadenopathy.  The liver, spleen, pancreas, adrenal glands, and kidneys are normal appearance.  IMPRESSION:  Mild sigmoid diverticulitis.  No evidence of abscess or other complication.  Original Report  Authenticated By: JOHN A. STAHL, M.D.   Assessment & Plan:   Jose was seen today for diabetes, hypertension, hyperlipidemia and hypothyroidism.  Diagnoses and all orders for this visit:  Essential hypertension Orders: -     POCT CBC -     POCT glycosylated hemoglobin (Hb A1C) -     CMP14+EGFR  Hyperlipidemia with target LDL less than 100 Orders: -     Lipid panel  Type 2 diabetes mellitus without complication  Hypothyroidism due to acquired atrophy of thyroid Orders: -     POCT glycosylated hemoglobin (Hb A1C)  Other orders -     furosemide (LASIX) 40 MG tablet; Take 1 tablet (40 mg total) by mouth daily. -     benazepril (LOTENSIN) 20 MG tablet; Take 1 tablet (20 mg total) by mouth daily. -     levothyroxine (SYNTHROID, LEVOTHROID) 100 MCG tablet; Take 1 tablet (100 mcg total) by mouth daily. -     insulin NPH-regular Human (NOVOLIN 70/30) (70-30) 100 UNIT/ML injection; 55 u in AM and 60U each evening -     Pneumococcal conjugate vaccine 13-valent IM     Meds ordered this encounter  Medications  . furosemide (LASIX) 40 MG tablet    Sig: Take 1 tablet (40 mg total) by mouth daily.    Dispense:  30 tablet    Refill:  5  . benazepril (LOTENSIN) 20 MG tablet    Sig: Take 1 tablet (20 mg total) by mouth daily.    Dispense:  30 tablet    Refill:  2  . levothyroxine (SYNTHROID, LEVOTHROID) 100 MCG tablet    Sig: Take 1 tablet (100 mcg total) by mouth daily.    Dispense:  30 tablet    Refill:  5  . insulin NPH-regular Human (NOVOLIN 70/30) (70-30) 100 UNIT/ML injection    Sig: 55 u in AM and 60U each evening    Dispense:  10 mL    Refill:  11    Comments: foot exam completed. Discussed controlled "cheating" - one dessert every 2 weeks in a small  Portion to give her incentive to avoid them routinely. Follow-up: Return in about 3 months (around 03/25/2015) for diabetes, hypertension.   , M.D.  

## 2014-12-25 NOTE — Patient Instructions (Signed)
Diabetes and Foot Care Diabetes may cause you to have problems because of poor blood supply (circulation) to your feet and legs. This may cause the skin on your feet to become thinner, break easier, and heal more slowly. Your skin may become dry, and the skin may peel and crack. You may also have nerve damage in your legs and feet causing decreased feeling in them. You may not notice minor injuries to your feet that could lead to infections or more serious problems. Taking care of your feet is one of the most important things you can do for yourself.  HOME CARE INSTRUCTIONS  Wear shoes at all times, even in the house. Do not go barefoot. Bare feet are easily injured.  Check your feet daily for blisters, cuts, and redness. If you cannot see the bottom of your feet, use a mirror or ask someone for help.  Wash your feet with warm water (do not use hot water) and mild soap. Then pat your feet and the areas between your toes until they are completely dry. Do not soak your feet as this can dry your skin.  Apply a moisturizing lotion or petroleum jelly (that does not contain alcohol and is unscented) to the skin on your feet and to dry, brittle toenails. Do not apply lotion between your toes.  Trim your toenails straight across. Do not dig under them or around the cuticle. File the edges of your nails with an emery board or nail file.  Do not cut corns or calluses or try to remove them with medicine.  Wear clean socks or stockings every day. Make sure they are not too tight. Do not wear knee-high stockings since they may decrease blood flow to your legs.  Wear shoes that fit properly and have enough cushioning. To break in new shoes, wear them for just a few hours a day. This prevents you from injuring your feet. Always look in your shoes before you put them on to be sure there are no objects inside.  Do not cross your legs. This may decrease the blood flow to your feet.  If you find a minor scrape,  cut, or break in the skin on your feet, keep it and the skin around it clean and dry. These areas may be cleansed with mild soap and water. Do not cleanse the area with peroxide, alcohol, or iodine.  When you remove an adhesive bandage, be sure not to damage the skin around it.  If you have a wound, look at it several times a day to make sure it is healing.  Do not use heating pads or hot water bottles. They may burn your skin. If you have lost feeling in your feet or legs, you may not know it is happening until it is too late.  Make sure your health care provider performs a complete foot exam at least annually or more often if you have foot problems. Report any cuts, sores, or bruises to your health care provider immediately. SEEK MEDICAL CARE IF:   You have an injury that is not healing.  You have cuts or breaks in the skin.  You have an ingrown nail.  You notice redness on your legs or feet.  You feel burning or tingling in your legs or feet.  You have pain or cramps in your legs and feet.  Your legs or feet are numb.  Your feet always feel cold. SEEK IMMEDIATE MEDICAL CARE IF:   There is increasing redness,   swelling, or pain in or around a wound.  There is a red line that goes up your leg.  Pus is coming from a wound.  You develop a fever or as directed by your health care provider.  You notice a bad smell coming from an ulcer or wound. Document Released: 10/27/2000 Document Revised: 07/02/2013 Document Reviewed: 04/08/2013 [facility] Patient Information 2015 [place], [place]. This information is not intended to replace advice given to you by your health care provider. Make sure you discuss any questions you have with your health care provider. Basic Carbohydrate Counting for Diabetes Mellitus Carbohydrate counting is a method for keeping track of the amount of carbohydrates you eat. Eating carbohydrates naturally increases the level of sugar (glucose) in your blood, so it is  important for you to know the amount that is okay for you to have in every meal. Carbohydrate counting helps keep the level of glucose in your blood within normal limits. The amount of carbohydrates allowed is different for every person. A dietitian can help you calculate the amount that is right for you. Once you know the amount of carbohydrates you can have, you can count the carbohydrates in the foods you want to eat. Carbohydrates are found in the following foods:  Grains, such as breads and cereals.  Dried beans and soy products.  Starchy vegetables, such as potatoes, peas, and corn.  Fruit and fruit juices.  Milk and yogurt.  Sweets and snack foods, such as cake, cookies, candy, chips, soft drinks, and fruit drinks. CARBOHYDRATE COUNTING There are two ways to count the carbohydrates in your food. You can use either of the methods or a combination of both. Reading the "Nutrition Facts" on [place] The "Nutrition Facts" is an area that is included on the labels of almost all packaged food and beverages in the [place]. It includes the serving size of that food or beverage and information about the nutrients in each serving of the food, including the grams (g) of carbohydrate per serving.  Decide the number of servings of this food or beverage that you will be able to eat or drink. Multiply that number of servings by the number of grams of carbohydrate that is listed on the label for that serving. The total will be the amount of carbohydrates you will be having when you eat or drink this food or beverage. Learning Standard Serving Sizes of Food When you eat food that is not packaged or does not include "Nutrition Facts" on the label, you need to measure the servings in order to count the amount of carbohydrates.A serving of most carbohydrate-rich foods contains about 15 g of carbohydrates. The following list includes serving sizes of carbohydrate-rich foods that provide 15 g  ofcarbohydrate per serving:   1 slice of bread (1 oz) or 1 six-inch tortilla.    of a hamburger bun or English muffin.  4-6 crackers.   cup unsweetened dry cereal.    cup hot cereal.   cup rice or pasta.    cup mashed potatoes or  of a large baked potato.  1 cup fresh fruit or one small piece of fruit.    cup canned or frozen fruit or fruit juice.  1 cup milk.   cup plain fat-free yogurt or yogurt sweetened with artificial sweeteners.   cup cooked dried beans or starchy vegetable, such as peas, corn, or potatoes.  Decide the number of standard-size servings that you will eat. Multiply that number of servings by 15 (  the grams of carbohydrates in that serving). For example, if you eat 2 cups of strawberries, you will have eaten 2 servings and 30 g of carbohydrates (2 servings x 15 g = 30 g). For foods such as soups and casseroles, in which more than one food is mixed in, you will need to count the carbohydrates in each food that is included. EXAMPLE OF CARBOHYDRATE COUNTING Sample Dinner  3 oz chicken breast.   cup of brown rice.   cup of corn.  1 cup milk.   1 cup strawberries with sugar-free whipped topping.  Carbohydrate Calculation Step 1: Identify the foods that contain carbohydrates:   Rice.   Corn.   Milk.   Strawberries. Step 2:Calculate the number of servings eaten of each:   2 servings of rice.   1 serving of corn.   1 serving of milk.   1 serving of strawberries. Step 3: Multiply each of those number of servings by 15 g:   2 servings of rice x 15 g = 30 g.   1 serving of corn x 15 g = 15 g.   1 serving of milk x 15 g = 15 g.   1 serving of strawberries x 15 g = 15 g. Step 4: Add together all of the amounts to find the total grams of carbohydrates eaten: 30 g + 15 g + 15 g + 15 g = 75 g. Document Released: 10/30/2005 Document Revised: 03/16/2014 Document Reviewed: 09/26/2013 [facility] Patient Information 2015  [place], [place]. This information is not intended to replace advice given to you by your health care provider. Make sure you discuss any questions you have with your health care provider.

## 2014-12-26 LAB — LIPID PANEL
Chol/HDL Ratio: 2.4 ratio units (ref 0.0–4.4)
Cholesterol, Total: 170 mg/dL (ref 100–199)
HDL: 72 mg/dL (ref >39)
LDL Calculated: 72 mg/dL (ref 0–99)
Triglycerides: 131 mg/dL (ref 0–149)
VLDL CHOLESTEROL CAL: 26 mg/dL (ref 5–40)

## 2014-12-26 LAB — CMP14+EGFR
A/G RATIO: 1.4 (ref 1.1–2.5)
ALBUMIN: 4.2 g/dL (ref 3.6–4.8)
ALT: 21 IU/L (ref 0–32)
AST: 22 IU/L (ref 0–40)
Alkaline Phosphatase: 113 IU/L (ref 39–117)
BILIRUBIN TOTAL: 0.5 mg/dL (ref 0.0–1.2)
BUN/Creatinine Ratio: 13 (ref 11–26)
BUN: 15 mg/dL (ref 8–27)
CO2: 22 mmol/L (ref 18–29)
Calcium: 9.8 mg/dL (ref 8.7–10.3)
Chloride: 104 mmol/L (ref 97–108)
Creatinine, Ser: 1.14 mg/dL — ABNORMAL HIGH (ref 0.57–1.00)
GFR calc Af Amer: 58 mL/min/{1.73_m2} — ABNORMAL LOW (ref >59)
GFR, EST NON AFRICAN AMERICAN: 50 mL/min/{1.73_m2} — AB (ref >59)
GLOBULIN, TOTAL: 3 g/dL (ref 1.5–4.5)
GLUCOSE: 159 mg/dL — AB (ref 65–99)
Potassium: 4.3 mmol/L (ref 3.5–5.2)
Sodium: 140 mmol/L (ref 134–144)
Total Protein: 7.2 g/dL (ref 6.0–8.5)

## 2015-02-08 ENCOUNTER — Encounter (INDEPENDENT_AMBULATORY_CARE_PROVIDER_SITE_OTHER): Payer: Self-pay | Admitting: *Deleted

## 2015-02-09 ENCOUNTER — Other Ambulatory Visit: Payer: Self-pay | Admitting: Family Medicine

## 2015-03-26 ENCOUNTER — Ambulatory Visit (INDEPENDENT_AMBULATORY_CARE_PROVIDER_SITE_OTHER): Payer: Medicare Other | Admitting: Family Medicine

## 2015-03-26 ENCOUNTER — Ambulatory Visit (INDEPENDENT_AMBULATORY_CARE_PROVIDER_SITE_OTHER): Payer: Medicare Other

## 2015-03-26 ENCOUNTER — Encounter: Payer: Self-pay | Admitting: Family Medicine

## 2015-03-26 VITALS — BP 143/73 | HR 66 | Temp 97.6°F | Ht 62.0 in | Wt 173.0 lb

## 2015-03-26 DIAGNOSIS — E785 Hyperlipidemia, unspecified: Secondary | ICD-10-CM

## 2015-03-26 DIAGNOSIS — M810 Age-related osteoporosis without current pathological fracture: Secondary | ICD-10-CM

## 2015-03-26 DIAGNOSIS — Z780 Asymptomatic menopausal state: Secondary | ICD-10-CM | POA: Diagnosis not present

## 2015-03-26 DIAGNOSIS — E038 Other specified hypothyroidism: Secondary | ICD-10-CM | POA: Diagnosis not present

## 2015-03-26 DIAGNOSIS — I429 Cardiomyopathy, unspecified: Secondary | ICD-10-CM

## 2015-03-26 DIAGNOSIS — E119 Type 2 diabetes mellitus without complications: Secondary | ICD-10-CM | POA: Diagnosis not present

## 2015-03-26 DIAGNOSIS — I10 Essential (primary) hypertension: Secondary | ICD-10-CM

## 2015-03-26 LAB — POCT CBC
Granulocyte percent: 76.1 %G (ref 37–80)
HEMATOCRIT: 37.4 % — AB (ref 37.7–47.9)
Hemoglobin: 12.2 g/dL (ref 12.2–16.2)
Lymph, poc: 1.7 (ref 0.6–3.4)
MCH: 26.7 pg — AB (ref 27–31.2)
MCHC: 32.7 g/dL (ref 31.8–35.4)
MCV: 81.5 fL (ref 80–97)
MPV: 10.5 fL (ref 0–99.8)
PLATELET COUNT, POC: 324 10*3/uL (ref 142–424)
POC Granulocyte: 6.4 (ref 2–6.9)
POC LYMPH PERCENT: 20.3 %L (ref 10–50)
RBC: 4.59 M/uL (ref 4.04–5.48)
RDW, POC: 13.9 %
WBC: 8.4 10*3/uL (ref 4.6–10.2)

## 2015-03-26 LAB — POCT GLYCOSYLATED HEMOGLOBIN (HGB A1C): HEMOGLOBIN A1C: 7

## 2015-03-26 MED ORDER — RALOXIFENE HCL 60 MG PO TABS: 60.0000 mg | ORAL_TABLET | Freq: Every day | ORAL | Status: DC

## 2015-03-26 MED ORDER — BENAZEPRIL HCL 20 MG PO TABS: 20.0000 mg | ORAL_TABLET | Freq: Every day | ORAL | Status: DC

## 2015-03-26 MED ORDER — FLUOCINONIDE-E 0.05 % EX CREA: 1.0000 "application " | TOPICAL_CREAM | Freq: Two times a day (BID) | CUTANEOUS | Status: DC

## 2015-03-26 MED ORDER — AMLODIPINE BESYLATE 10 MG PO TABS: 10.0000 mg | ORAL_TABLET | Freq: Every day | ORAL | Status: DC

## 2015-03-26 MED ORDER — ISOSORBIDE MONONITRATE ER 60 MG PO TB24: ORAL_TABLET | ORAL | Status: DC

## 2015-03-26 MED ORDER — SIMVASTATIN 20 MG PO TABS: 20.0000 mg | ORAL_TABLET | Freq: Every day | ORAL | Status: DC

## 2015-03-26 NOTE — Patient Instructions (Signed)

## 2015-03-26 NOTE — Progress Notes (Signed)
Subjective:  Patient ID: Brianna Weber, female    DOB: Jun 30, 1946  Age: 69 y.o. MRN: [id number]  CC: Diabetes; Hypertension; Hyperlipidemia; Hypothyroidism; and Osteoporosis   HPI Brianna Weber presents for Patient in for follow-up of elevated cholesterol. Doing well without complaints on current medication. Denies side effects of statin including myalgia and arthralgia and nausea. Also in today for liver function testing. Currently no chest pain, shortness of breath or other cardiovascular related symptoms noted.  Patient presents for follow-up on  thyroid. She has a history of hypothyroidism for many years. It has been stable recently. Pt. denies any change in  voice, loss of hair, heat or cold intolerance. Energy level has been adequate to good. She denies constipation and diarrhea. No myxedema. Medication is as noted below. Verified that pt is taking it daily on an empty stomach. Well tolerated.  Follow-up  of hypertension. Patient has no history of headache chest pain or shortness of breath or recent cough. Patient also denies symptoms of TIA such as numbness weakness lateralizing. Patient checks  blood pressure at home and has not had any elevated readings recently. Patient denies side effects from his medication. States taking it regularly.  Follow-up of diabetes. Patient does not check blood sugar at home Patient denies symptoms such as polyuria, polydipsia, excessive hunger, nausea No significant hypoglycemic spells noted. Medications as noted below. Taking them regularly without complication/adverse reaction being reported today.    History Marigene has a past medical history of Hypertension; Arrhythmia; Thyroid disease; Hyperlipidemia; NSTEMI (non-ST elevated myocardial infarction); Diabetes mellitus; Diverticulosis of colon; Breast cancer; Breast cancer; Allergy; Peripheral edema; Myocardial infarct; and Vitamin D deficiency.   She has past surgical history that  includes Mastectomy; Cholecystectomy; and Abdominal hysterectomy.   Her family history includes Cancer in her maternal aunt; Diabetes in her daughter; Heart attack in her father; Heart disease in her brother, brother, maternal aunt, maternal grandmother, maternal uncle, mother, sister, sister, and sister; Hyperlipidemia in her maternal aunt and maternal uncle; Hypertension in her maternal aunt, maternal uncle, mother, and another family member.She reports that she has never smoked. She has never used smokeless tobacco. She reports that she does not drink alcohol or use illicit drugs.  Current Outpatient Prescriptions on File Prior to Visit  Medication Sig Dispense Refill  . aspirin 81 MG tablet Take 81 mg by mouth daily.      . fexofenadine (ALLEGRA) 180 MG tablet Take 180 mg by mouth daily.      . furosemide (LASIX) 40 MG tablet Take 1 tablet (40 mg total) by mouth daily. 30 tablet 5  . glucose blood (ONE TOUCH ULTRA TEST) test strip Test 2X per day an dprn-   Insulin dependent-- 250.02 400 each 12  . insulin NPH-regular Human (NOVOLIN 70/30) (70-30) 100 UNIT/ML injection 55 u in AM and 60U each evening 10 mL 11  . levothyroxine (SYNTHROID, LEVOTHROID) 100 MCG tablet Take 1 tablet (100 mcg total) by mouth daily. 30 tablet 5  . vitamin D, CHOLECALCIFEROL, 400 UNITS tablet Take 400 Units by mouth daily.       No current facility-administered medications on file prior to visit.    ROS Review of Systems  Constitutional: Negative for fever, chills, diaphoresis, appetite change, fatigue and unexpected weight change.  HENT: Negative for congestion, ear pain, hearing loss, postnasal drip, rhinorrhea, sneezing, sore throat and trouble swallowing.   Eyes: Negative for pain.  Respiratory: Negative for cough, chest tightness and shortness of breath.  Cardiovascular: Negative for chest pain and palpitations.  Gastrointestinal: Negative for nausea, vomiting, abdominal pain, diarrhea and constipation.    Genitourinary: Negative for dysuria, frequency and menstrual problem.  Musculoskeletal: Positive for back pain (midline back pain in the thoracic and lumbar regions). Negative for joint swelling and arthralgias.  Skin: Negative for rash.  Neurological: Negative for dizziness, weakness, numbness and headaches.  Psychiatric/Behavioral: Negative for dysphoric mood and agitation.    Objective:  BP 143/73 mmHg  Pulse 66  Temp(Src) 97.6 F (36.4 C) (Oral)  Ht _0  (1.575 m)  Wt 173 lb (78.472 kg)  BMI 31.63 kg/m2  BP Readings from Last 3 Encounters:  03/26/15 143/73  12/25/14 134/66  09/16/14 132/71    Wt Readings from Last 3 Encounters:  03/26/15 173 lb (78.472 kg)  12/25/14 174 lb 12.8 oz (79.289 kg)  09/16/14 169 lb 3.2 oz (76.749 kg)     Physical Exam  Constitutional: She is oriented to person, place, and time. She appears well-developed and well-nourished. No distress.  HENT:  Head: Normocephalic and atraumatic.  Right Ear: External ear normal.  Left Ear: External ear normal.  Nose: Nose normal.  Mouth/Throat: Oropharynx is clear and moist.  Eyes: Conjunctivae and EOM are normal. Pupils are equal, round, and reactive to light.  Neck: Normal range of motion. Neck supple. No thyromegaly present.  Cardiovascular: Normal rate, regular rhythm and normal heart sounds.   No murmur heard. Pulmonary/Chest: Effort normal and breath sounds normal. No respiratory distress. She has no wheezes. She has no rales.  Abdominal: Soft. Bowel sounds are normal. She exhibits no distension. There is no tenderness.  Lymphadenopathy:    She has no cervical adenopathy.  Neurological: She is alert and oriented to person, place, and time. She has normal reflexes.  Skin: Skin is warm and dry.  Psychiatric: She has a normal mood and affect. Her behavior is normal. Judgment and thought content normal.    Lab Results  Component Value Date   HGBA1C 7.0 03/26/2015   HGBA1C 7.2 12/25/2014    HGBA1C 7.2% 09/16/2014    Lab Results  Component Value Date   WBC 8.4 03/26/2015   HGB 12.2 03/26/2015   HCT 37.4* 03/26/2015   GLUCOSE 152* 03/26/2015   CHOL 167 03/26/2015   TRIG 174* 03/26/2015   HDL 60 03/26/2015   LDLCALC 72 12/25/2014   ALT 21 03/26/2015   AST 24 03/26/2015   NA 140 03/26/2015   K 4.7 03/26/2015   CL 101 03/26/2015   CREATININE 1.17* 03/26/2015   BUN 14 03/26/2015   CO2 23 03/26/2015   TSH 1.880 03/26/2015   HGBA1C 7.0 03/26/2015    Ct Abdomen Pelvis W Contrast  07/27/2011   *RADIOLOGY REPORT*  Clinical Data: Left lower quadrant pain.  Diarrhea. Diverticulosis.  Breast carcinoma.  CT ABDOMEN AND PELVIS WITH CONTRAST  Technique:  Multidetector CT imaging of the abdomen and pelvis was performed following the standard protocol during bolus administration of intravenous contrast.  Contrast: 5m OMNIPAQUE IOHEXOL 300 MG/ML IV SOLN  Comparison: 03/26/2007  Findings: Moderate to severe diverticulosis is seen involving majority of the colon.  There is mild wall thickening and pericolonic inflammatory change involving the distal sigmoid colon. This is consistent with sigmoid diverticulitis.  There is no evidence of abscess or free fluid.  No evidence of bowel obstruction.  Previous hysterectomy noted.  No soft tissue masses are seen within the abdomen or pelvis.  No evidence of lymphadenopathy.  The liver, spleen, pancreas,  adrenal glands, and kidneys are normal appearance.  IMPRESSION:  Mild sigmoid diverticulitis.  No evidence of abscess or other complication.  Original Report Authenticated By: Marlaine Hind, M.D.   Assessment & Plan:   Brinnley was seen today for diabetes, hypertension, hyperlipidemia, hypothyroidism and osteoporosis.  Diagnoses and all orders for this visit:  Other specified hypothyroidism Orders: -     Thyroid Panel With TSH; Standing -     Thyroid Panel With TSH  Essential hypertension Orders: -     POCT CBC; Standing -     CMP14+EGFR;  Standing -     POCT CBC -     CMP14+EGFR  Diabetes mellitus type 2, controlled, without complications Orders: -     POCT glycosylated hemoglobin (Hb A1C); Standing -     POCT UA - Microalbumin; Standing -     CMP14+EGFR; Standing -     POCT glycosylated hemoglobin (Hb A1C) -     CMP14+EGFR  Postmenopausal Orders: -     Vit D  25 hydroxy (rtn osteoporosis monitoring); Standing -     Vit D  25 hydroxy (rtn osteoporosis monitoring)  Secondary cardiomyopathy Orders: -     isosorbide mononitrate (IMDUR) 60 MG 24 hr tablet; TAKE 1 TABLET ONCE A DAY  Hyperlipemia Orders: -     Lipid panel; Standing -     NMR, lipoprofile; Standing -     NMR, lipoprofile  Osteoporosis Orders: -     Vit D  25 hydroxy (rtn osteoporosis monitoring); Standing -     DG Bone Density -     Vit D  25 hydroxy (rtn osteoporosis monitoring)  Other orders -     benazepril (LOTENSIN) 20 MG tablet; Take 1 tablet (20 mg total) by mouth daily. -     simvastatin (ZOCOR) 20 MG tablet; Take 1 tablet (20 mg total) by mouth daily. -     amLODipine (NORVASC) 10 MG tablet; Take 1 tablet (10 mg total) by mouth daily. -     fluocinonide-emollient (LIDEX-E) 0.05 % cream; Apply 1 application topically 2 (two) times daily. -     raloxifene (EVISTA) 60 MG tablet; Take 1 tablet (60 mg total) by mouth daily.   I have discontinued Ms. Popson's HUMULIN 70/30. I have also changed her simvastatin and amLODipine. Additionally, I am having her start on fluocinonide-emollient and raloxifene. Lastly, I am having her maintain her aspirin, fexofenadine, vitamin D (CHOLECALCIFEROL), glucose blood, furosemide, levothyroxine, insulin NPH-regular Human, benazepril, and isosorbide mononitrate.  Meds ordered this encounter  Medications  . benazepril (LOTENSIN) 20 MG tablet    Sig: Take 1 tablet (20 mg total) by mouth daily.    Dispense:  30 tablet    Refill:  11  . isosorbide mononitrate (IMDUR) 60 MG 24 hr tablet    Sig: TAKE 1 TABLET  ONCE A DAY    Dispense:  60 tablet    Refill:  5  . simvastatin (ZOCOR) 20 MG tablet    Sig: Take 1 tablet (20 mg total) by mouth daily.    Dispense:  30 tablet    Refill:  11  . amLODipine (NORVASC) 10 MG tablet    Sig: Take 1 tablet (10 mg total) by mouth daily.    Dispense:  30 tablet    Refill:  11  . fluocinonide-emollient (LIDEX-E) 0.05 % cream    Sig: Apply 1 application topically 2 (two) times daily.    Dispense:  60 g    Refill:  5  . raloxifene (EVISTA) 60 MG tablet    Sig: Take 1 tablet (60 mg total) by mouth daily.    Dispense:  30 tablet    Refill:  5   Back pain exercises handout given  Follow-up: Return in about 3 months (around 06/26/2015).  [name], M.D.

## 2015-03-27 LAB — THYROID PANEL WITH TSH
Free Thyroxine Index: 3.2 (ref 1.2–4.9)
T3 UPTAKE RATIO: 30 % (ref 24–39)
T4, Total: 10.8 ug/dL (ref 4.5–12.0)
TSH: 1.88 u[IU]/mL (ref 0.450–4.500)

## 2015-03-27 LAB — CMP14+EGFR
ALBUMIN: 4.2 g/dL (ref 3.6–4.8)
ALK PHOS: 107 IU/L (ref 39–117)
ALT: 21 IU/L (ref 0–32)
AST: 24 IU/L (ref 0–40)
Albumin/Globulin Ratio: 1.5 (ref 1.1–2.5)
BUN / CREAT RATIO: 12 (ref 11–26)
BUN: 14 mg/dL (ref 8–27)
Bilirubin Total: 0.5 mg/dL (ref 0.0–1.2)
CALCIUM: 10 mg/dL (ref 8.7–10.3)
CO2: 23 mmol/L (ref 18–29)
Chloride: 101 mmol/L (ref 97–108)
Creatinine, Ser: 1.17 mg/dL — ABNORMAL HIGH (ref 0.57–1.00)
GFR, EST AFRICAN AMERICAN: 56 mL/min/{1.73_m2} — AB (ref >59)
GFR, EST NON AFRICAN AMERICAN: 48 mL/min/{1.73_m2} — AB (ref >59)
Globulin, Total: 2.8 g/dL (ref 1.5–4.5)
Glucose: 152 mg/dL — ABNORMAL HIGH (ref 65–99)
Potassium: 4.7 mmol/L (ref 3.5–5.2)
Sodium: 140 mmol/L (ref 134–144)
Total Protein: 7 g/dL (ref 6.0–8.5)

## 2015-03-27 LAB — NMR, LIPOPROFILE
CHOLESTEROL: 167 mg/dL (ref 100–199)
HDL Cholesterol by NMR: 60 mg/dL (ref >39)
HDL PARTICLE NUMBER: 34.2 umol/L (ref >=30.5)
LDL Particle Number: 959 nmol/L (ref <1000)
LDL Size: 20.9 nm (ref >20.5)
LDL-C: 72 mg/dL (ref 0–99)
LP-IR SCORE: 49 — AB (ref <=45)
Small LDL Particle Number: 294 nmol/L (ref <=527)
Triglycerides by NMR: 174 mg/dL — ABNORMAL HIGH (ref 0–149)

## 2015-03-27 LAB — VITAMIN D 25 HYDROXY (VIT D DEFICIENCY, FRACTURES): Vit D, 25-Hydroxy: 25.1 ng/mL — ABNORMAL LOW (ref 30.0–100.0)

## 2015-03-29 ENCOUNTER — Other Ambulatory Visit: Payer: Self-pay | Admitting: Family Medicine

## 2015-03-29 MED ORDER — VITAMIN D (ERGOCALCIFEROL) 1.25 MG (50000 UNIT) PO CAPS: 50000.0000 [IU] | ORAL_CAPSULE | ORAL | Status: DC

## 2015-03-31 ENCOUNTER — Telehealth: Payer: Self-pay | Admitting: *Deleted

## 2015-03-31 NOTE — Telephone Encounter (Signed)
-----   Message from [name], MD sent at 03/29/2015  7:11 PM EDT ----- Dear Barnetta Chapel,   Your Vitamin D is quite low. You need a high dose supplement. Use 50,000 units twice weekly for two months. I will send that in as a 1 time only prescription.  Then switch to OTC Vitamin D 2000 units daily. Recheck vitamin D level with office visit in 6 months.  Otherwise your lab results look good. You do have a mild weakening of the kidneys that is not harmful. It is likely age related. Drinking more water may be helpful.  Best regards, [name] M.D.

## 2015-03-31 NOTE — Telephone Encounter (Signed)
lmtcb regarding test results. 

## 2015-04-07 DIAGNOSIS — Z1231 Encounter for screening mammogram for malignant neoplasm of breast: Secondary | ICD-10-CM | POA: Diagnosis not present

## 2015-04-27 ENCOUNTER — Encounter: Payer: Self-pay | Admitting: Nurse Practitioner

## 2015-05-11 ENCOUNTER — Other Ambulatory Visit: Payer: Self-pay | Admitting: Family Medicine

## 2015-05-14 ENCOUNTER — Other Ambulatory Visit: Payer: Self-pay | Admitting: Nurse Practitioner

## 2015-07-03 ENCOUNTER — Telehealth: Payer: Self-pay | Admitting: *Deleted

## 2015-07-03 NOTE — Telephone Encounter (Signed)
Pt has relative that has Lantus that she (relative) cannot use  Pt wants to know if she can use the Lantus instead of Novolin 70/30 Please review and advise

## 2015-07-04 NOTE — Telephone Encounter (Signed)
She could do that at. However it leaves her without any specific mealtime coverage. She should monitor her glucose 2 hours after her meals and if the results are over 160 then either she would have to go back to the Novolin 70/30 or he would have to add a mealtime insulin such as NovoLog. Having said that if she wants to try, she can use 80 units of Lantus once daily. I would suggest starting it in the morning instead of her usual 70/30. Then she would not use the 7030 in the evening after that.

## 2015-07-05 ENCOUNTER — Ambulatory Visit: Payer: Medicare Other | Admitting: Nurse Practitioner

## 2015-07-05 ENCOUNTER — Ambulatory Visit: Payer: Medicare Other | Admitting: Family Medicine

## 2015-07-05 NOTE — Telephone Encounter (Signed)
Patient informed of directions for Lantus and need to document blood sugars and contact us if any variations, patient voices understanding

## 2015-07-15 ENCOUNTER — Other Ambulatory Visit: Payer: Self-pay | Admitting: Family Medicine

## 2015-07-27 ENCOUNTER — Encounter: Payer: Self-pay | Admitting: Family Medicine

## 2015-07-27 ENCOUNTER — Ambulatory Visit (INDEPENDENT_AMBULATORY_CARE_PROVIDER_SITE_OTHER): Payer: Medicare Other | Admitting: Family Medicine

## 2015-07-27 VITALS — BP 113/63 | HR 62 | Temp 97.7°F | Ht 62.0 in | Wt 167.0 lb

## 2015-07-27 DIAGNOSIS — I209 Angina pectoris, unspecified: Secondary | ICD-10-CM | POA: Diagnosis not present

## 2015-07-27 DIAGNOSIS — E785 Hyperlipidemia, unspecified: Secondary | ICD-10-CM | POA: Diagnosis not present

## 2015-07-27 DIAGNOSIS — I10 Essential (primary) hypertension: Secondary | ICD-10-CM

## 2015-07-27 DIAGNOSIS — E119 Type 2 diabetes mellitus without complications: Secondary | ICD-10-CM | POA: Diagnosis not present

## 2015-07-27 LAB — POCT GLYCOSYLATED HEMOGLOBIN (HGB A1C): HEMOGLOBIN A1C: 7.1

## 2015-07-27 NOTE — Progress Notes (Signed)
Subjective:  Patient ID: Brianna Weber, female    DOB: 1946/05/31  Age: 69 y.o. MRN: [id number]  CC: Diabetes; Hypertension; Hypothyroidism; and Hyperlipidemia   HPI Brianna Weber presents for  follow-up of hypertension. Patient has no history of headache chest pain or shortness of breath or recent cough. Patient also denies symptoms of TIA such as numbness weakness lateralizing. Patient checks  blood pressure at home and has not had any elevated readings recently. Patient denies side effects from his medication. States taking it regularly.  Patient also  in for follow-up of elevated cholesterol. Doing well without complaints on current medication. Denies side effects of statin including myalgia and arthralgia and nausea. Also in today for liver function testing. Currently no chest pain, shortness of breath or other cardiovascular related symptoms noted.  Follow-up of diabetes. Patient does check blood sugar at home. Readings run between 130 and 200+ Patient denies symptoms such as polyuria, polydipsia, excessive hunger, nausea No significant hypoglycemic spells noted. Medications as noted below. Taking them regularly without complication/adverse reaction being reported today. States she snacks at night with ice cream - 2 scoops,etc. Realizes she needs to stop. Eats a cookie after working hard.  Hx of heart attack. Now chest pains controlled with isosorbide. Hx of light stroke years ago. History Brianna Weber has a past medical history of Hypertension; Arrhythmia; Thyroid disease; Hyperlipidemia; NSTEMI (non-ST elevated myocardial infarction); Diabetes mellitus; Diverticulosis of colon; Breast cancer; Breast cancer; Allergy; Peripheral edema; Myocardial infarct; and Vitamin D deficiency. 200  She has past surgical history that includes Mastectomy; Cholecystectomy; and Abdominal hysterectomy.   Her family history includes Cancer in her maternal aunt; Diabetes in her daughter; Heart  attack in her father; Heart disease in her brother, brother, maternal aunt, maternal grandmother, maternal uncle, mother, sister, sister, and sister; Hyperlipidemia in her maternal aunt and maternal uncle; Hypertension in her maternal aunt, maternal uncle, mother, and another family member.She reports that she has never smoked. She has never used smokeless tobacco. She reports that she does not drink alcohol or use illicit drugs.  Current Outpatient Prescriptions on File Prior to Visit  Medication Sig Dispense Refill  . amLODipine (NORVASC) 10 MG tablet Take 1 tablet (10 mg total) by mouth daily. 30 tablet 11  . aspirin 81 MG tablet Take 81 mg by mouth daily.      . benazepril (LOTENSIN) 20 MG tablet Take 1 tablet (20 mg total) by mouth daily. 30 tablet 11  . fexofenadine (ALLEGRA) 180 MG tablet Take 180 mg by mouth daily.      . fluocinonide-emollient (LIDEX-E) 0.05 % cream Apply 1 application topically 2 (two) times daily. 60 g 5  . furosemide (LASIX) 40 MG tablet Take 1 tablet (40 mg total) by mouth daily. 30 tablet 5  . insulin NPH-regular Human (NOVOLIN 70/30) (70-30) 100 UNIT/ML injection 55 u in AM and 60U each evening 10 mL 11  . isosorbide mononitrate (IMDUR) 60 MG 24 hr tablet TAKE 1 TABLET ONCE A DAY 60 tablet 5  . levothyroxine (SYNTHROID, LEVOTHROID) 100 MCG tablet Take 1 tablet (100 mcg total) by mouth daily. 30 tablet 7  . ONE TOUCH ULTRA TEST test strip TEST TWICE A DAY 400 each 0  . raloxifene (EVISTA) 60 MG tablet Take 1 tablet (60 mg total) by mouth daily. 30 tablet 5  . simvastatin (ZOCOR) 20 MG tablet Take 1 tablet (20 mg total) by mouth daily. 30 tablet 11  . vitamin D, CHOLECALCIFEROL, 400 UNITS tablet Take  400 Units by mouth daily.       No current facility-administered medications on file prior to visit.    ROS Review of Systems  Constitutional: Negative for fever, chills, diaphoresis, appetite change, fatigue and unexpected weight change.  HENT: Negative for  congestion, ear pain, hearing loss, postnasal drip, rhinorrhea, sneezing, sore throat and trouble swallowing.   Eyes: Negative for pain.  Respiratory: Negative for cough, chest tightness and shortness of breath.   Cardiovascular: Negative for chest pain and palpitations.  Gastrointestinal: Negative for nausea, vomiting, abdominal pain, diarrhea and constipation.  Genitourinary: Negative for dysuria, frequency and menstrual problem.  Musculoskeletal: Negative for joint swelling and arthralgias.  Skin: Negative for rash.  Neurological: Negative for dizziness, weakness, numbness and headaches.  Psychiatric/Behavioral: Negative for dysphoric mood and agitation.    Objective:  BP 113/63 mmHg  Pulse 62  Temp(Src) 97.7 F (36.5 C) (Oral)  Ht 5\' 2"  (1.575 m)  Wt 167 lb (75.751 kg)  BMI 30.54 kg/m2  BP Readings from Last 3 Encounters:  07/27/15 113/63  03/26/15 143/73  12/25/14 134/66    Wt Readings from Last 3 Encounters:  07/27/15 167 lb (75.751 kg)  03/26/15 173 lb (78.472 kg)  12/25/14 174 lb 12.8 oz (79.289 kg)     Physical Exam  Constitutional: She is oriented to person, place, and time. She appears well-developed and well-nourished. No distress.  HENT:  Head: Normocephalic and atraumatic.  Right Ear: External ear normal.  Left Ear: External ear normal.  Nose: Nose normal.  Mouth/Throat: Oropharynx is clear and moist.  Eyes: Conjunctivae and EOM are normal. Pupils are equal, round, and reactive to light.  Neck: Normal range of motion. Neck supple. No thyromegaly present.  Cardiovascular: Normal rate, regular rhythm and normal heart sounds.   No murmur heard. Pulmonary/Chest: Effort normal and breath sounds normal. No respiratory distress. She has no wheezes. She has no rales.  Abdominal: Soft. Bowel sounds are normal. She exhibits no distension. There is no tenderness.  Lymphadenopathy:    She has no cervical adenopathy.  Neurological: She is alert and oriented to  person, place, and time. She has normal reflexes.  Skin: Skin is warm and dry.  Psychiatric: She has a normal mood and affect. Her behavior is normal. Judgment and thought content normal.    Lab Results  Component Value Date   HGBA1C 7.1 07/27/2015   HGBA1C 7.0 03/26/2015   HGBA1C 7.2 12/25/2014    Lab Results  Component Value Date   WBC 8.4 03/26/2015   HGB 12.2 03/26/2015   HCT 37.4* 03/26/2015   GLUCOSE 152* 03/26/2015   CHOL 167 03/26/2015   TRIG 174* 03/26/2015   HDL 60 03/26/2015   LDLCALC 72 12/25/2014   ALT 21 03/26/2015   AST 24 03/26/2015   NA 140 03/26/2015   K 4.7 03/26/2015   CL 101 03/26/2015   CREATININE 1.17* 03/26/2015   BUN 14 03/26/2015   CO2 23 03/26/2015   TSH 1.880 03/26/2015   HGBA1C 7.1 07/27/2015    Ct Abdomen Pelvis W Contrast  07/27/2011   *RADIOLOGY REPORT*  Clinical Data: Left lower quadrant pain.  Diarrhea. Diverticulosis.  Breast carcinoma.  CT ABDOMEN AND PELVIS WITH CONTRAST  Technique:  Multidetector CT imaging of the abdomen and pelvis was performed following the standard protocol during bolus administration of intravenous contrast.  Contrast: 80mL OMNIPAQUE IOHEXOL 300 MG/ML IV SOLN  Comparison: 03/26/2007  Findings: Moderate to severe diverticulosis is seen involving majority of the colon.  There is mild wall thickening and pericolonic inflammatory change involving the distal sigmoid colon. This is consistent with sigmoid diverticulitis.  There is no evidence of abscess or free fluid.  No evidence of bowel obstruction.  Previous hysterectomy noted.  No soft tissue masses are seen within the abdomen or pelvis.  No evidence of lymphadenopathy.  The liver, spleen, pancreas, adrenal glands, and kidneys are normal appearance.  IMPRESSION:  Mild sigmoid diverticulitis.  No evidence of abscess or other complication.  Original Report Authenticated By: Marlaine Hind, M.D.   Assessment & Plan:   Brianna Weber was seen today for diabetes, hypertension,  hypothyroidism and hyperlipidemia.  Diagnoses and all orders for this visit:  Angina pectoris  Diabetes mellitus type 2, controlled, without complications -     POCT glycosylated hemoglobin (Hb A1C) -     CMP14+EGFR -     Microalbumin, urine  Hyperlipemia -     Lipid panel  Essential hypertension -     CMP14+EGFR   I have discontinued Brianna Weber's Vitamin D (Ergocalciferol) and HUMULIN 70/30. I am also having her maintain her aspirin, fexofenadine, vitamin D (CHOLECALCIFEROL), furosemide, insulin NPH-regular Human, benazepril, isosorbide mononitrate, simvastatin, amLODipine, fluocinonide-emollient, raloxifene, ONE TOUCH ULTRA TEST, and levothyroxine.  No orders of the defined types were placed in this encounter.     Follow-up: Return in about 3 months (around 10/26/2015) for diabetes, hypertension thyroid.  [name], M.D.

## 2015-07-28 LAB — CMP14+EGFR
A/G RATIO: 1.4 (ref 1.1–2.5)
ALBUMIN: 4.2 g/dL (ref 3.6–4.8)
ALT: 25 IU/L (ref 0–32)
AST: 26 IU/L (ref 0–40)
Alkaline Phosphatase: 104 IU/L (ref 39–117)
BUN / CREAT RATIO: 17 (ref 11–26)
BUN: 17 mg/dL (ref 8–27)
Bilirubin Total: 0.4 mg/dL (ref 0.0–1.2)
CALCIUM: 9.9 mg/dL (ref 8.7–10.3)
CO2: 21 mmol/L (ref 18–29)
CREATININE: 1.03 mg/dL — AB (ref 0.57–1.00)
Chloride: 101 mmol/L (ref 97–108)
GFR calc Af Amer: 65 mL/min/{1.73_m2} (ref >59)
GFR calc non Af Amer: 56 mL/min/{1.73_m2} — ABNORMAL LOW (ref >59)
GLOBULIN, TOTAL: 3 g/dL (ref 1.5–4.5)
Glucose: 101 mg/dL — ABNORMAL HIGH (ref 65–99)
POTASSIUM: 4.3 mmol/L (ref 3.5–5.2)
SODIUM: 140 mmol/L (ref 134–144)
Total Protein: 7.2 g/dL (ref 6.0–8.5)

## 2015-07-28 LAB — LIPID PANEL
CHOLESTEROL TOTAL: 164 mg/dL (ref 100–199)
Chol/HDL Ratio: 2.3 ratio units (ref 0.0–4.4)
HDL: 71 mg/dL (ref >39)
LDL CALC: 63 mg/dL (ref 0–99)
TRIGLYCERIDES: 149 mg/dL (ref 0–149)
VLDL Cholesterol Cal: 30 mg/dL (ref 5–40)

## 2015-07-28 LAB — MICROALBUMIN, URINE: Microalbumin, Urine: 10.2 ug/mL

## 2015-08-12 ENCOUNTER — Ambulatory Visit (INDEPENDENT_AMBULATORY_CARE_PROVIDER_SITE_OTHER): Payer: Medicare Other

## 2015-08-12 DIAGNOSIS — Z23 Encounter for immunization: Secondary | ICD-10-CM

## 2015-10-01 ENCOUNTER — Ambulatory Visit (INDEPENDENT_AMBULATORY_CARE_PROVIDER_SITE_OTHER): Payer: Medicare Other | Admitting: Pharmacist

## 2015-10-01 ENCOUNTER — Encounter: Payer: Self-pay | Admitting: Pharmacist

## 2015-10-01 VITALS — BP 132/78 | HR 64 | Ht 62.0 in | Wt 165.0 lb

## 2015-10-01 DIAGNOSIS — Z1211 Encounter for screening for malignant neoplasm of colon: Secondary | ICD-10-CM

## 2015-10-01 DIAGNOSIS — Z0000 Encounter for general adult medical examination without abnormal findings: Secondary | ICD-10-CM | POA: Diagnosis not present

## 2015-10-01 NOTE — Patient Instructions (Addendum)
[name] , Thank you for taking time to come for your Medicare Wellness Visit. I appreciate your ongoing commitment to your health goals. Please review the following plan we discussed and let me know if I can assist you in the future.   These are the goals we discussed: Try in increase physical activity - goal is 150 minutes weekly.   Can also try chair exercise to help with core strength / decrease falls.   Change Insulin Novolin 70/30 - continue 55 units each morning with breakfast but decrease evening / supper dose to 58 units  If you continue to have more than 1 blood glucose that is less than 80 in the morning after 1 week then decrease to 56 units with supper  Increase non-starchy vegetables - carrots, green bean, squash, zucchini, tomatoes, onions, peppers, spinach and other green leafy vegetables, cabbage, lettuce, cucumbers, asparagus, okra (not fried), eggplant limit sugar and processed foods (cakes, cookies, ice cream, crackers and chips) Increase fresh fruit but limit serving sizes 1/2 cup or about the size of tennis or baseball limit red meat to no more than 1-2 times per week (serving size about the size of your palm) Choose whole grains / lean proteins - whole wheat bread, quinoa, whole grain rice (1/2 cup), fish, chicken, [place]    This is a list of the screening recommended for you and due dates:  Health Maintenance  Topic Date Due  .  Hepatitis C: One time screening is recommended by Center for Disease Control  (CDC) for  adults born from [birth year] through 1963.   12/22/1945 - get with labs n December  . Eye exam for diabetics  Due now - recommend make appointment as soon as able  . Stool Blood Test  Test given in office today to return to office  . Complete foot exam   12/26/2015  . Hemoglobin A1C  01/24/2016  . Pap Smear  03/03/2016  . Flu Shot  06/13/2016  . Urine Protein Check  07/26/2016  . Mammogram  04/06/2017  . Colon Cancer Screening  12/25/2021  . Tetanus  Vaccine  11/01/2023  . DEXA scan (bone density measurement)  Completed  . Shingles Vaccine  Completed  . Pneumonia vaccines  Completed    Fall Prevention in the Home  Falls can cause injuries and can affect people from all age groups. There are many simple things that you can do to make your home safe and to help prevent falls. WHAT CAN I DO ON THE OUTSIDE OF MY HOME?  Regularly repair the edges of walkways and driveways and fix any cracks.  Remove high doorway thresholds.  Trim any shrubbery on the main path into your home.  Use bright outdoor lighting.  Clear walkways of debris and clutter, including tools and rocks.  Regularly check that handrails are securely fastened and in good repair. Both sides of any steps should have handrails.  Install guardrails along the edges of any raised decks or porches.  Have leaves, snow, and ice cleared regularly.  Use sand or salt on walkways during winter months.  In the garage, clean up any spills right away, including grease or oil spills. WHAT CAN I DO IN THE BATHROOM?  Use night lights.  Install grab bars by the toilet and in the tub and shower. Do not use towel bars as grab bars.  Use non-skid mats or decals on the floor of the tub or shower.  If you need to sit down while you  are in the shower, use a plastic, non-slip stool.Marland Kitchen  Keep the floor dry. Immediately clean up any water that spills on the floor.  Remove soap buildup in the tub or shower on a regular basis.  Attach bath mats securely with double-sided non-slip rug tape.  Remove throw rugs and other tripping hazards from the floor. WHAT CAN I DO IN THE BEDROOM?  Use night lights.  Make sure that a bedside light is easy to reach.  Do not use oversized bedding that drapes onto the floor.  Have a firm chair that has side arms to use for getting dressed.  Remove throw rugs and other tripping hazards from the floor. WHAT CAN I DO IN THE KITCHEN?   Clean up any  spills right away.  Avoid walking on wet floors.  Place frequently used items in easy-to-reach places.  If you need to reach for something above you, use a sturdy step stool that has a grab bar.  Keep electrical cables out of the way.  Do not use floor polish or wax that makes floors slippery. If you have to use wax, make sure that it is non-skid floor wax.  Remove throw rugs and other tripping hazards from the floor. WHAT CAN I DO IN THE STAIRWAYS?  Do not leave any items on the stairs.  Make sure that there are handrails on both sides of the stairs. Fix handrails that are broken or loose. Make sure that handrails are as long as the stairways.  Check any carpeting to make sure that it is firmly attached to the stairs. Fix any carpet that is loose or worn.  Avoid having throw rugs at the top or bottom of stairways, or secure the rugs with carpet tape to prevent them from moving.  Make sure that you have a light switch at the top of the stairs and the bottom of the stairs. If you do not have them, have them installed. WHAT ARE SOME OTHER FALL PREVENTION TIPS?  Wear closed-toe shoes that fit well and support your feet. Wear shoes that have rubber soles or low heels.  When you use a stepladder, make sure that it is completely opened and that the sides are firmly locked. Have someone hold the ladder while you are using it. Do not climb a closed stepladder.  Add color or contrast paint or tape to grab bars and handrails in your home. Place contrasting color strips on the first and last steps.  Use mobility aids as needed, such as canes, walkers, scooters, and crutches.  Turn on lights if it is dark. Replace any light bulbs that burn out.  Set up furniture so that there are clear paths. Keep the furniture in the same spot.  Fix any uneven floor surfaces.  Choose a carpet design that does not hide the edge of steps of a stairway.  Be aware of any and all pets.  Review your  medicines with your healthcare provider. Some medicines can cause dizziness or changes in blood pressure, which increase your risk of falling. Talk with your health care provider about other ways that you can decrease your risk of falls. This may include working with a physical therapist or trainer to improve your strength, balance, and endurance.   This information is not intended to replace advice given to you by your health care provider. Make sure you discuss any questions you have with your health care provider.   Document Released: 10/20/2002 Document Revised: 03/16/2015 Document Reviewed: 12/04/2014 Elsevier  Interactive Patient Education 2016 Elsevier Inc.  

## 2015-10-01 NOTE — Progress Notes (Addendum)
Subjective:   Brianna Weber is a 69 y.o. female who presents for an Initial Medicare Annual Wellness Visit. She arrived in a good mood with her husband. 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 Concerned over the price of her husband's and her own medications.   Last A1c: 7.1  Current Medications (verified) Outpatient Encounter Prescriptions as of 10/01/2015  Medication Sig  . amLODipine (NORVASC) 10 MG tablet Take 1 tablet (10 mg total) by mouth daily.  Marland Kitchen aspirin 81 MG tablet Take 81 mg by mouth daily.    . benazepril (LOTENSIN) 20 MG tablet Take 1 tablet (20 mg total) by mouth daily.  . fexofenadine (ALLEGRA) 180 MG tablet Take 180 mg by mouth daily.    . furosemide (LASIX) 40 MG tablet Take 1 tablet (40 mg total) by mouth daily.  . insulin NPH-regular Human (NOVOLIN 70/30) (70-30) 100 UNIT/ML injection 55 u in AM and 58U each evening  . isosorbide mononitrate (IMDUR) 60 MG 24 hr tablet TAKE 1 TABLET ONCE A DAY  . levothyroxine (SYNTHROID, LEVOTHROID) 100 MCG tablet Take 1 tablet (100 mcg total) by mouth daily.  . ONE TOUCH ULTRA TEST test strip TEST TWICE A DAY  . raloxifene (EVISTA) 60 MG tablet Take 1 tablet (60 mg total) by mouth daily.  . simvastatin (ZOCOR) 20 MG tablet Take 1 tablet (20 mg total) by mouth daily.  . vitamin D, CHOLECALCIFEROL, 400 UNITS tablet Take 400 Units by mouth daily.    . [DISCONTINUED] insulin NPH-regular Human (NOVOLIN 70/30) (70-30) 100 UNIT/ML injection 55 u in AM and 60U each evening  . fluocinonide-emollient (LIDEX-E) 0.05 % cream Apply 1 application topically 2 (two) times daily. (Patient not taking: Reported on 10/01/2015)   No facility-administered encounter medications on file as of 10/01/2015.   Allergies (verified) Review of patient's allergies indicates no known allergies.   History: Past Medical History  Diagnosis Date  . Hypertension   . Arrhythmia      bradycardia  . Thyroid disease     hypothyroidism  . Hyperlipidemia   . NSTEMI (non-ST elevated myocardial infarction) ([place])     2004 secondary to stress induced cardiomyopathy. Normal LV function by repeatr echo.  . Diabetes mellitus   . Diverticulosis of colon   . Breast cancer ([place])   . Breast cancer [facility])     s/p left mastectomy  . Allergy   . Peripheral edema   . Myocardial infarct ([place])   . Vitamin D deficiency    Past Surgical History  Procedure Laterality Date  . Mastectomy      left  . Cholecystectomy    . Abdominal hysterectomy     Family History  Problem Relation Age of Onset  . Hypertension    . Hypertension Mother   . Heart disease Mother   . Heart attack Father   . Heart disease Sister   . Heart disease Brother   . Cancer Maternal Aunt     breast   . Heart disease Maternal Aunt   . Hyperlipidemia Maternal Aunt   . Hypertension Maternal Aunt   . Heart disease Maternal Uncle   . Hyperlipidemia Maternal Uncle   . Hypertension Maternal Uncle   . Heart disease Maternal Grandmother   . Heart disease Sister   . Heart disease Sister   . Heart disease Brother   . Diabetes Daughter    Social History  Occupational History  . Not on file.   Social History Main Topics  . Smoking status: Never Smoker   . Smokeless tobacco: Never Used  . Alcohol Use: No  . Drug Use: No  . Sexual Activity: Not Currently    Birth Control/ Protection: Surgical   Dietary issues and exercise activities: Current Exercise Habits:: The patient has a physically strenous job, but has no regular exercise apart from work. 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  Current Dietary habits: Patient reports watching what she eats. Reports she does not eat very much and wishes she had a better appetite.   Objective:    Today's Vitals   10/01/15 1035  BP: 132/78  Pulse: 64  Height: 5\' 2"  (1.575 m)  Weight: 165 lb (74.844  kg)   Body mass index is 30.17 kg/(m^2).  Activities of Daily Living In your present state of health, do you have any difficulty performing the following activities: 10/01/2015  Hearing? N  Vision? N  Difficulty concentrating or making decisions? N  Walking or climbing stairs? N  Dressing or bathing? N  Doing errands, shopping? N  Preparing Food and eating ? N  Using the Toilet? N  In the past six months, have you accidently leaked urine? Y  Do you have problems with loss of bowel control? N  Managing your Medications? N  Managing your Finances? N  Housekeeping or managing your Housekeeping? N   Are there smokers in your home (other than you)? No   Cardiac Risk Factors include: advanced age (>63men, >42 women);diabetes mellitus;dyslipidemia;obesity (BMI >30kg/m2)  Depression Screen PHQ 2/9 Scores 10/01/2015 07/27/2015 03/26/2015 12/25/2014  PHQ - 2 Score 1 5 1  0  PHQ- 9 Score - 6 - -    Fall Risk Fall Risk  10/01/2015 07/27/2015 03/26/2015 12/25/2014 09/16/2014  Falls in the past year? Yes No No No No  Number falls in past yr: 1 - - - -  Injury with Fall? No - - - -  Risk for fall due to : History of fall(s) - - - -  Note: Reported falling down the steps while carrying trash bag and not holding onto handrail. There was no fracture and pt was not injured.   Cognitive Function: MMSE - Mini Mental State Exam 10/01/2015  Orientation to time 4  Orientation to Place 5  Registration 3  Attention/ Calculation 4  Recall 2  Language- name 2 objects 2  Language- repeat 1  Language- follow 3 step command 3  Language- read & follow direction 1  Write a sentence 1  Copy design 1  Total score 27    Immunizations and Health Maintenance Immunization History  Administered Date(s) Administered  . Influenza,inj,Quad PF,36+ Mos 09/16/2014, 08/12/2015  . Pneumococcal Conjugate-13 12/25/2014  . Pneumococcal Polysaccharide-23 03/17/2013   Health Maintenance Due  Topic Date Due  .  Hepatitis C Screening  [birth year]  . OPHTHALMOLOGY EXAM  02/18/1958  . COLON CANCER SCREENING ANNUAL FOBT  03/11/2015    Patient Care Team: [name], MD as PCP - General (Family Medicine)   Assessment:   Annual Wellness Visit: Type 2 Diabetes Mellitus, uncontrolled, last A1c 7.1 (07/27/15) and BG avg ~95-100 over past 2 weeks. Several AM readings dipped into the 60s. The lowest low recorded was 43 and highest high 334. Pt felt as though she was about to experience s/sx of hypoglycemia when BG was 43, but was able to correct  before. Reported correcting low BG with peanut butter and cookies. The highest high was reported after eating a large breakfast. Pt reports having to eat a snack before she goes to bed in order to keep BG from going too low over night.  Screening Tests Health Maintenance  Topic Date Due  . Hepatitis C Screening  [birth year]  . OPHTHALMOLOGY EXAM  02/18/1958  . COLON CANCER SCREENING ANNUAL FOBT  03/11/2015  . FOOT EXAM  12/26/2015  . HEMOGLOBIN A1C  01/24/2016  . PAP SMEAR  03/03/2016  . INFLUENZA VACCINE  06/13/2016  . URINE MICROALBUMIN  07/26/2016  . MAMMOGRAM  04/06/2017  . COLONOSCOPY  12/25/2021  . TETANUS/TDAP  11/01/2023  . DEXA SCAN  Completed  . ZOSTAVAX  Completed  . PNA vac Low Risk Adult  Completed        Plan:   During the course of the visit George was educated and counseled about the following appropriate screening and preventive services:    Colorectal cancer screening - given stool blood test to bring back to office  Diabetes screening - changed insulin dose (see pt instructions). Counseled pt on correct way to tx hypoglycemia and provided a handout.    Glaucoma screening / Diabetic Eye Exam - due now, recommended making an appointment ASAP.   Nutrition counseling - counseled on limiting carbohydrates, increasing non-starchy vegetables, and increasing fruit.   Reviewed proper treatment of hypoglycemia and handout  given  Advanced Directives - pts spouse reported they already had ADs  Patient Instructions: Try in increase physical activity - goal is 150 minutes weekly. Can also try chair exercise to help with core strength / decrease falls.   Change Insulin Novolin 70/30 - continue 55 units each morning with breakfast but decrease evening / supper dose to 58 units  If you continue to have more than 1 blood glucose that is less than 80 in the morning after 1 week then decrease to 56 units with supper  Increase non-starchy vegetables, increase fresh fruit but limit serving sizes, limit red meat, and choose whole grains / lean proteins.  HepC screening - get w/ labs in December Eye exam for DM - due now, recommend making appointment as soon as able Stool Blood Test - test given in office today to return to office.  Patient Instructions (the written plan) were given to the patient.   Cherre Robins, [facility]   10/01/2015         I have reviewed and agree with the above AWV documentation.  [name], M.D.

## 2015-10-13 ENCOUNTER — Other Ambulatory Visit: Payer: Self-pay | Admitting: Family Medicine

## 2015-10-14 ENCOUNTER — Other Ambulatory Visit: Payer: Self-pay | Admitting: Family Medicine

## 2015-10-15 ENCOUNTER — Other Ambulatory Visit: Payer: Medicare Other

## 2015-10-15 DIAGNOSIS — Z1211 Encounter for screening for malignant neoplasm of colon: Secondary | ICD-10-CM

## 2015-11-09 ENCOUNTER — Ambulatory Visit: Payer: Self-pay | Admitting: Family Medicine

## 2015-12-17 ENCOUNTER — Telehealth: Payer: Self-pay | Admitting: Family Medicine

## 2016-01-06 ENCOUNTER — Ambulatory Visit (INDEPENDENT_AMBULATORY_CARE_PROVIDER_SITE_OTHER): Payer: Medicare Other | Admitting: Family Medicine

## 2016-01-06 ENCOUNTER — Ambulatory Visit: Payer: Medicare Other | Admitting: Family Medicine

## 2016-01-06 ENCOUNTER — Encounter: Payer: Self-pay | Admitting: Family Medicine

## 2016-01-06 VITALS — BP 138/67 | HR 71 | Temp 97.3°F | Ht 62.0 in | Wt 159.4 lb

## 2016-01-06 DIAGNOSIS — I10 Essential (primary) hypertension: Secondary | ICD-10-CM

## 2016-01-06 DIAGNOSIS — E785 Hyperlipidemia, unspecified: Secondary | ICD-10-CM | POA: Diagnosis not present

## 2016-01-06 DIAGNOSIS — Z794 Long term (current) use of insulin: Secondary | ICD-10-CM

## 2016-01-06 DIAGNOSIS — E039 Hypothyroidism, unspecified: Secondary | ICD-10-CM

## 2016-01-06 DIAGNOSIS — Z780 Asymptomatic menopausal state: Secondary | ICD-10-CM | POA: Diagnosis not present

## 2016-01-06 DIAGNOSIS — E559 Vitamin D deficiency, unspecified: Secondary | ICD-10-CM | POA: Diagnosis not present

## 2016-01-06 DIAGNOSIS — E119 Type 2 diabetes mellitus without complications: Secondary | ICD-10-CM

## 2016-01-06 DIAGNOSIS — Z7689 Persons encountering health services in other specified circumstances: Secondary | ICD-10-CM | POA: Diagnosis not present

## 2016-01-06 LAB — POCT GLYCOSYLATED HEMOGLOBIN (HGB A1C): Hemoglobin A1C: 6

## 2016-01-06 NOTE — Addendum Note (Signed)
Addended by: [name] on: 01/06/2016 09:23 AM   Modules accepted: Miquel Dunn

## 2016-01-06 NOTE — Progress Notes (Signed)
Subjective:  Patient ID: Brianna Weber, female    DOB: 01-13-1946  Age: 70 y.o. MRN: [id number]  CC: Hypertension and Diabetes   HPI Brianna Weber presents for  follow-up of hypertension. Patient has no history of headache chest pain or shortness of breath or recent cough. Patient also denies symptoms of TIA such as numbness weakness lateralizing. Patient checks  blood pressure at home and has not had any elevated readings recently. Patient denies side effects from his medication. States taking it regularly.  Patient also  in for follow-up of elevated cholesterol. Doing well without complaints on current medication. Denies side effects of statin including myalgia and arthralgia and nausea. Also in today for liver function testing. Currently no chest pain, shortness of breath or other cardiovascular related symptoms noted.  Follow-up of diabetes. Patient does check blood sugar at home. Readings run between 125 and 170s Patient denies symptoms such as polyuria, polydipsia, excessive hunger, nausea No significant hypoglycemic spells noted. Medications as noted below. Taking them regularly without complication/adverse reaction being reported today.    History [place] has a past medical history of Hypertension; Arrhythmia; Thyroid disease; Hyperlipidemia; NSTEMI (non-ST elevated myocardial infarction) ([place]); Diabetes mellitus; Diverticulosis of colon; Breast cancer ([place]); Breast cancer ([place]); Allergy; Peripheral edema; Myocardial infarct ([place]); and Vitamin D deficiency.   She has past surgical history that includes Mastectomy; Cholecystectomy; and Abdominal hysterectomy.   Her family history includes Cancer in her maternal aunt; Diabetes in her daughter; Heart attack in her father; Heart disease in her brother, brother, maternal aunt, maternal grandmother, maternal uncle, mother, sister, sister, and sister; Hyperlipidemia in her maternal aunt and maternal uncle; Hypertension in her  maternal aunt, maternal uncle, and mother.She reports that she has never smoked. She has never used smokeless tobacco. She reports that she does not drink alcohol or use illicit drugs.  Current Outpatient Prescriptions on File Prior to Visit  Medication Sig Dispense Refill  . amLODipine (NORVASC) 10 MG tablet Take 1 tablet (10 mg total) by mouth daily. 30 tablet 11  . aspirin 81 MG tablet Take 81 mg by mouth daily.      . benazepril (LOTENSIN) 20 MG tablet Take 1 tablet (20 mg total) by mouth daily. 30 tablet 11  . fexofenadine (ALLEGRA) 180 MG tablet Take 180 mg by mouth daily.      . furosemide (LASIX) 40 MG tablet Take 1 tablet (40 mg total) by mouth daily. 30 tablet 3  . insulin NPH-regular Human (NOVOLIN 70/30) (70-30) 100 UNIT/ML injection 55 u in AM and 58U each evening 10 mL 11  . isosorbide mononitrate (IMDUR) 60 MG 24 hr tablet TAKE 1 TABLET ONCE A DAY 60 tablet 5  . levothyroxine (SYNTHROID, LEVOTHROID) 100 MCG tablet Take 1 tablet (100 mcg total) by mouth daily. 30 tablet 7  . ONE TOUCH ULTRA TEST test strip TEST TWICE A DAY 150 each 0  . raloxifene (EVISTA) 60 MG tablet Take 1 tablet (60 mg total) by mouth daily. 30 tablet 5  . simvastatin (ZOCOR) 20 MG tablet Take 1 tablet (20 mg total) by mouth daily. 30 tablet 11  . vitamin D, CHOLECALCIFEROL, 400 UNITS tablet Take 400 Units by mouth daily.       No current facility-administered medications on file prior to visit.    ROS Review of Systems  Constitutional: Negative for fever, activity change and appetite change.  HENT: Negative for congestion, rhinorrhea and sore throat.   Eyes: Negative for visual disturbance.  Respiratory:  Negative for cough and shortness of breath.   Cardiovascular: Negative for chest pain and palpitations.  Gastrointestinal: Negative for nausea, abdominal pain and diarrhea.  Genitourinary: Negative for dysuria.  Musculoskeletal: Negative for myalgias and arthralgias.    Objective:  BP 138/67 mmHg   Pulse 71  Temp(Src) 97.3 F (36.3 C) (Oral)  Ht _0  (1.575 m)  Wt 159 lb 6.4 oz (72.303 kg)  BMI 29.15 kg/m2  SpO2 99%  BP Readings from Last 3 Encounters:  01/06/16 138/67  10/01/15 132/78  07/27/15 113/63    Wt Readings from Last 3 Encounters:  01/06/16 159 lb 6.4 oz (72.303 kg)  10/01/15 165 lb (74.844 kg)  07/27/15 167 lb (75.751 kg)     Physical Exam  Constitutional: She is oriented to person, place, and time. She appears well-developed and well-nourished. No distress.  HENT:  Head: Normocephalic and atraumatic.  Right Ear: External ear normal.  Left Ear: External ear normal.  Nose: Nose normal.  Mouth/Throat: Oropharynx is clear and moist.  Eyes: Conjunctivae and EOM are normal. Pupils are equal, round, and reactive to light.  Neck: Normal range of motion. Neck supple. No thyromegaly present.  Cardiovascular: Normal rate, regular rhythm and normal heart sounds.   No murmur heard. Pulmonary/Chest: Effort normal and breath sounds normal. No respiratory distress. She has no wheezes. She has no rales.  Abdominal: Soft. Bowel sounds are normal. She exhibits no distension. There is no tenderness.  Lymphadenopathy:    She has no cervical adenopathy.  Neurological: She is alert and oriented to person, place, and time. She has normal reflexes.  Skin: Skin is warm and dry.  Psychiatric: She has a normal mood and affect. Her behavior is normal. Judgment and thought content normal.    Lab Results  Component Value Date   HGBA1C 7.1 07/27/2015   HGBA1C 7.0 03/26/2015   HGBA1C 7.2 12/25/2014    Lab Results  Component Value Date   WBC 8.4 03/26/2015   HGB 12.2 03/26/2015   HCT 37.4* 03/26/2015   GLUCOSE 101* 07/27/2015   CHOL 164 07/27/2015   TRIG 149 07/27/2015   HDL 71 07/27/2015   LDLCALC 63 07/27/2015   ALT 25 07/27/2015   AST 26 07/27/2015   NA 140 07/27/2015   K 4.3 07/27/2015   CL 101 07/27/2015   CREATININE 1.03* 07/27/2015   BUN 17 07/27/2015   CO2  21 07/27/2015   TSH 1.880 03/26/2015   HGBA1C 7.1 07/27/2015    Ct Abdomen Pelvis W Contrast  07/27/2011  *RADIOLOGY REPORT* Clinical Data: Left lower quadrant pain.  Diarrhea. Diverticulosis.  Breast carcinoma. CT ABDOMEN AND PELVIS WITH CONTRAST Technique:  Multidetector CT imaging of the abdomen and pelvis was performed following the standard protocol during bolus administration of intravenous contrast. Contrast: 64m OMNIPAQUE IOHEXOL 300 MG/ML IV SOLN Comparison: 03/26/2007 Findings: Moderate to severe diverticulosis is seen involving majority of the colon.  There is mild wall thickening and pericolonic inflammatory change involving the distal sigmoid colon. This is consistent with sigmoid diverticulitis. There is no evidence of abscess or free fluid.  No evidence of bowel obstruction.  Previous hysterectomy noted. No soft tissue masses are seen within the abdomen or pelvis.  No evidence of lymphadenopathy.  The liver, spleen, pancreas, adrenal glands, and kidneys are normal appearance. IMPRESSION: Mild sigmoid diverticulitis.  No evidence of abscess or other complication. Original Report Authenticated By: JMarlaine Hind M.D.   Assessment & Plan:   CDaneawas seen today for hypertension  and diabetes.  Diagnoses and all orders for this visit:  Type 2 diabetes mellitus without complication, with long-term current use of insulin (HCC) -     CBC with Differential/Platelet -     TSH + free T4 -     CMP14+EGFR -     Lipid panel -     POCT urinalysis dipstick -     POCT glycosylated hemoglobin (Hb A1C) -     Microalbumin / creatinine urine ratio  Vitamin D deficiency -     Vitamin D 1,25 dihydroxy -     CMP14+EGFR  Hypothyroidism, unspecified hypothyroidism type -     TSH + free T4 -     CMP14+EGFR  Hyperlipidemia -     Lipid panel  Postmenopausal  Essential hypertension   I have discontinued Brianna Weber's fluocinonide-emollient. I am also having her maintain her aspirin,  fexofenadine, vitamin D (CHOLECALCIFEROL), benazepril, isosorbide mononitrate, simvastatin, amLODipine, raloxifene, levothyroxine, insulin NPH-regular Human, furosemide, and ONE TOUCH ULTRA TEST.  No orders of the defined types were placed in this encounter.     Follow-up: Return in about 6 months (around 07/05/2016).  [name], M.D.

## 2016-01-06 NOTE — Addendum Note (Signed)
Addended by: Wyline Mood on: 01/06/2016 09:28 AM   Modules accepted: Miquel Dunn

## 2016-01-07 LAB — URINALYSIS, ROUTINE W REFLEX MICROSCOPIC
BILIRUBIN UA: NEGATIVE
Glucose, UA: NEGATIVE
KETONES UA: NEGATIVE
NITRITE UA: POSITIVE — AB
PH UA: 5.5 (ref 5.0–7.5)
SPEC GRAV UA: 1.021 (ref 1.005–1.030)
UUROB: 0.2 mg/dL (ref 0.2–1.0)

## 2016-01-07 LAB — MICROSCOPIC EXAMINATION: Casts: NONE SEEN /lpf

## 2016-01-07 LAB — SPECIMEN STATUS REPORT

## 2016-01-07 LAB — MICROALBUMIN / CREATININE URINE RATIO
CREATININE, UR: 209.9 mg/dL
MICROALB/CREAT RATIO: 26 mg/g creat (ref 0.0–30.0)
Microalbumin, Urine: 54.6 ug/mL

## 2016-01-10 ENCOUNTER — Other Ambulatory Visit: Payer: Self-pay | Admitting: Family Medicine

## 2016-01-10 MED ORDER — SULFAMETHOXAZOLE-TRIMETHOPRIM 800-160 MG PO TABS: 1.0000 | ORAL_TABLET | Freq: Two times a day (BID) | ORAL | Status: DC

## 2016-01-10 NOTE — Telephone Encounter (Signed)
Prescription sent

## 2016-01-11 LAB — CBC WITH DIFFERENTIAL/PLATELET
BASOS: 0 %
Basophils Absolute: 0 10*3/uL (ref 0.0–0.2)
EOS (ABSOLUTE): 0.1 10*3/uL (ref 0.0–0.4)
Eos: 1 %
HEMATOCRIT: 40.1 % (ref 34.0–46.6)
HEMOGLOBIN: 13.4 g/dL (ref 11.1–15.9)
IMMATURE GRANULOCYTES: 0 %
Immature Grans (Abs): 0 10*3/uL (ref 0.0–0.1)
Lymphocytes Absolute: 1.3 10*3/uL (ref 0.7–3.1)
Lymphs: 13 %
MCH: 27.6 pg (ref 26.6–33.0)
MCHC: 33.4 g/dL (ref 31.5–35.7)
MCV: 83 fL (ref 79–97)
MONOCYTES: 6 %
Monocytes Absolute: 0.7 10*3/uL (ref 0.1–0.9)
NEUTROS PCT: 80 %
Neutrophils Absolute: 8.4 10*3/uL — ABNORMAL HIGH (ref 1.4–7.0)
Platelets: 416 10*3/uL — ABNORMAL HIGH (ref 150–379)
RBC: 4.86 x10E6/uL (ref 3.77–5.28)
RDW: 13.9 % (ref 12.3–15.4)
WBC: 10.5 10*3/uL (ref 3.4–10.8)

## 2016-01-11 LAB — CMP14+EGFR
ALBUMIN: 4.3 g/dL (ref 3.6–4.8)
ALT: 14 IU/L (ref 0–32)
AST: 19 IU/L (ref 0–40)
Albumin/Globulin Ratio: 1.4 (ref 1.1–2.5)
Alkaline Phosphatase: 111 IU/L (ref 39–117)
BUN / CREAT RATIO: 10 — AB (ref 11–26)
BUN: 12 mg/dL (ref 8–27)
Bilirubin Total: 0.5 mg/dL (ref 0.0–1.2)
CO2: 22 mmol/L (ref 18–29)
CREATININE: 1.25 mg/dL — AB (ref 0.57–1.00)
Calcium: 10.3 mg/dL (ref 8.7–10.3)
Chloride: 101 mmol/L (ref 96–106)
GFR calc non Af Amer: 45 mL/min/{1.73_m2} — ABNORMAL LOW (ref >59)
GFR, EST AFRICAN AMERICAN: 51 mL/min/{1.73_m2} — AB (ref >59)
GLUCOSE: 95 mg/dL (ref 65–99)
Globulin, Total: 3 g/dL (ref 1.5–4.5)
Potassium: 4.7 mmol/L (ref 3.5–5.2)
Sodium: 142 mmol/L (ref 134–144)
TOTAL PROTEIN: 7.3 g/dL (ref 6.0–8.5)

## 2016-01-11 LAB — LIPID PANEL
CHOLESTEROL TOTAL: 149 mg/dL (ref 100–199)
Chol/HDL Ratio: 2 ratio units (ref 0.0–4.4)
HDL: 76 mg/dL (ref >39)
LDL CALC: 51 mg/dL (ref 0–99)
Triglycerides: 110 mg/dL (ref 0–149)
VLDL CHOLESTEROL CAL: 22 mg/dL (ref 5–40)

## 2016-01-11 LAB — VITAMIN D 1,25 DIHYDROXY
Vitamin D 1, 25 (OH)2 Total: 40 pg/mL
Vitamin D2 1, 25 (OH)2: <10 pg/mL
Vitamin D3 1, 25 (OH)2: 40 pg/mL

## 2016-01-11 LAB — TSH+FREE T4
FREE T4: 1.54 ng/dL (ref 0.82–1.77)
TSH: 0.288 u[IU]/mL — ABNORMAL LOW (ref 0.450–4.500)

## 2016-01-19 ENCOUNTER — Other Ambulatory Visit: Payer: Self-pay | Admitting: Family Medicine

## 2016-01-19 DIAGNOSIS — E162 Hypoglycemia, unspecified: Secondary | ICD-10-CM | POA: Diagnosis not present

## 2016-02-12 ENCOUNTER — Other Ambulatory Visit: Payer: Self-pay | Admitting: Family Medicine

## 2016-04-04 ENCOUNTER — Other Ambulatory Visit: Payer: Self-pay | Admitting: Family Medicine

## 2016-05-08 ENCOUNTER — Other Ambulatory Visit: Payer: Self-pay | Admitting: Family Medicine

## 2016-06-09 ENCOUNTER — Other Ambulatory Visit: Payer: Self-pay | Admitting: Family Medicine

## 2016-06-19 ENCOUNTER — Telehealth: Payer: Self-pay | Admitting: Family Medicine

## 2016-06-19 NOTE — Telephone Encounter (Signed)
Spoke to pt

## 2016-08-07 ENCOUNTER — Other Ambulatory Visit: Payer: Self-pay | Admitting: Family Medicine

## 2016-08-07 NOTE — Telephone Encounter (Signed)
Authorize 30 days only. Then contact the patient letting them know that they will need an appointment before any further prescriptions can be sent in. 

## 2016-08-08 NOTE — Telephone Encounter (Signed)
Authorize 30 days only. Then contact the patient letting them know that they will need an appointment before any further prescriptions can be sent in. 

## 2016-08-15 ENCOUNTER — Other Ambulatory Visit: Payer: Self-pay | Admitting: Family Medicine

## 2016-08-22 ENCOUNTER — Ambulatory Visit (INDEPENDENT_AMBULATORY_CARE_PROVIDER_SITE_OTHER): Payer: Medicare Other

## 2016-08-22 DIAGNOSIS — Z23 Encounter for immunization: Secondary | ICD-10-CM | POA: Diagnosis not present

## 2016-09-05 ENCOUNTER — Encounter: Payer: Medicare Other | Admitting: *Deleted

## 2016-10-02 ENCOUNTER — Ambulatory Visit (INDEPENDENT_AMBULATORY_CARE_PROVIDER_SITE_OTHER): Payer: Medicare Other | Admitting: Pharmacist

## 2016-10-02 ENCOUNTER — Encounter: Payer: Self-pay | Admitting: Pharmacist

## 2016-10-02 VITALS — BP 118/68 | HR 68 | Ht 62.0 in | Wt 162.0 lb

## 2016-10-02 DIAGNOSIS — E785 Hyperlipidemia, unspecified: Secondary | ICD-10-CM

## 2016-10-02 DIAGNOSIS — Z0000 Encounter for general adult medical examination without abnormal findings: Secondary | ICD-10-CM

## 2016-10-02 DIAGNOSIS — Z1239 Encounter for other screening for malignant neoplasm of breast: Secondary | ICD-10-CM

## 2016-10-02 DIAGNOSIS — E119 Type 2 diabetes mellitus without complications: Secondary | ICD-10-CM | POA: Diagnosis not present

## 2016-10-02 DIAGNOSIS — Z1159 Encounter for screening for other viral diseases: Secondary | ICD-10-CM

## 2016-10-02 LAB — BAYER DCA HB A1C WAIVED: HB A1C: 7.2 % — AB (ref <7.0)

## 2016-10-02 NOTE — Patient Instructions (Addendum)
  Brianna Weber , Thank you for taking time to come for your Medicare Wellness Visit. I appreciate your ongoing commitment to your health goals. Please review the following plan we discussed and let me know if I can assist you in the future.   These are the goals we discussed:  Make appointment to have diabetic eye exam - recommended once a year   Increase non-starchy vegetables - carrots, green bean, squash, zucchini, tomatoes, onions, peppers, spinach and other green leafy vegetables, cabbage, lettuce, cucumbers, asparagus, okra (not fried), eggplant Limit sugar and processed foods (cakes, cookies, ice cream, crackers and chips) Increase fresh fruit but limit serving sizes 1/2 cup or about the size of tennis or baseball Limit red meat to no more than 1-2 times per week (serving size about the size of your palm) Choose whole grains / lean proteins - whole wheat bread, quinoa, whole grain rice (1/2 cup), fish, chicken, [place] Avoid sugar and calorie containing beverages - soda, sweet tea and juice.  Choose water or unsweetened tea instead.    This is a list of the screening recommended for you and due dates:  Health Maintenance  Topic Date Due  .  Hepatitis C: One time screening is recommended by Center for Disease Control  (CDC) for  adults born from [birth year] through 1963.   Done today  . Eye exam for diabetics  02/18/1958  . Complete foot exam   12/26/2015 - done today  . Pap Smear  03/03/2016  . Hemoglobin A1C  07/05/2016 - done today  . Mammogram  04/06/2017  . Colon Cancer Screening  12/25/2021  . Tetanus Vaccine  11/01/2023  . Flu Shot  Addressed  . DEXA scan (bone density measurement)  Next due 03/2017  . Shingles Vaccine  Completed  . Pneumonia vaccines  Completed

## 2016-10-03 LAB — CMP14+EGFR
A/G RATIO: 1.4 (ref 1.2–2.2)
ALBUMIN: 4.4 g/dL (ref 3.6–4.8)
ALK PHOS: 121 IU/L — AB (ref 39–117)
ALT: 13 IU/L (ref 0–32)
AST: 16 IU/L (ref 0–40)
BILIRUBIN TOTAL: 0.5 mg/dL (ref 0.0–1.2)
BUN / CREAT RATIO: 11 — AB (ref 12–28)
BUN: 14 mg/dL (ref 8–27)
CHLORIDE: 99 mmol/L (ref 96–106)
CO2: 24 mmol/L (ref 18–29)
Calcium: 10.1 mg/dL (ref 8.7–10.3)
Creatinine, Ser: 1.33 mg/dL — ABNORMAL HIGH (ref 0.57–1.00)
GFR calc non Af Amer: 41 mL/min/{1.73_m2} — ABNORMAL LOW (ref >59)
GFR, EST AFRICAN AMERICAN: 47 mL/min/{1.73_m2} — AB (ref >59)
GLOBULIN, TOTAL: 3.1 g/dL (ref 1.5–4.5)
Glucose: 159 mg/dL — ABNORMAL HIGH (ref 65–99)
Potassium: 5.1 mmol/L (ref 3.5–5.2)
SODIUM: 139 mmol/L (ref 134–144)
TOTAL PROTEIN: 7.5 g/dL (ref 6.0–8.5)

## 2016-10-03 LAB — LIPID PANEL
Chol/HDL Ratio: 2.6 ratio units (ref 0.0–4.4)
Cholesterol, Total: 169 mg/dL (ref 100–199)
HDL: 64 mg/dL (ref >39)
LDL Calculated: 61 mg/dL (ref 0–99)
Triglycerides: 219 mg/dL — ABNORMAL HIGH (ref 0–149)
VLDL CHOLESTEROL CAL: 44 mg/dL — AB (ref 5–40)

## 2016-10-07 NOTE — Progress Notes (Addendum)
Patient ID: Brianna Weber, female   DOB: May 28, 1946, 70 y.o.   MRN: [id number]     Subjective:   Brianna Weber is a 70 y.o. female who presents for a subsequent Medicare Annual Wellness Visit.  Brianna Weber is married and her husband is present with her today.  She is WDWN and pleasant today.  She states that she does not have any health concerns but she is very concerned about her husband and taking care of him.   Brianna Weber has type 2 Dm requiring insulin therapy and has not had an A1c since 01/06/16 when it was 6.0% Reports that her BG is good at home - 85 to 150's  Current Medications (verified) Outpatient Encounter Prescriptions as of 10/02/2016  Medication Sig  . amLODipine (NORVASC) 10 MG tablet TAKE 1 TABLET DAILY  . aspirin 81 MG tablet Take 81 mg by mouth daily.    . benazepril (LOTENSIN) 20 MG tablet TAKE 1 TABLET DAILY  . fexofenadine (ALLEGRA) 180 MG tablet Take 180 mg by mouth daily.    . furosemide (LASIX) 40 MG tablet Take 1 tablet (40 mg total) by mouth daily.  Marland Kitchen glucose blood (ONE TOUCH ULTRA TEST) test strip Test bid. Dx E11.9  . insulin NPH-regular Human (NOVOLIN 70/30) (70-30) 100 UNIT/ML injection 55 u in AM and 58U each evening  . isosorbide mononitrate (IMDUR) 60 MG 24 hr tablet TAKE 1 TABLET ONCE A DAY  . levothyroxine (SYNTHROID, LEVOTHROID) 100 MCG tablet Take 1 tablet (100 mcg total) by mouth daily.  . raloxifene (EVISTA) 60 MG tablet Take 1 tablet (60 mg total) by mouth daily.  . simvastatin (ZOCOR) 20 MG tablet TAKE 1 TABLET DAILY  . vitamin D, CHOLECALCIFEROL, 400 UNITS tablet Take 400 Units by mouth daily.    . [DISCONTINUED] sulfamethoxazole-trimethoprim (BACTRIM DS,SEPTRA DS) 800-160 MG tablet Take 1 tablet by mouth 2 (two) times daily. (Patient not taking: Reported on 10/02/2016)   No facility-administered encounter medications on file as of 10/02/2016.     Allergies (verified) Patient has no known allergies.   History: Past Medical  History:  Diagnosis Date  . Allergy   . Arrhythmia    bradycardia  . Breast cancer ([place])   . Breast cancer [facility])    s/p left mastectomy  . Diabetes mellitus   . Diverticulosis of colon   . Hyperlipidemia   . Hypertension   . Myocardial infarct   . NSTEMI (non-ST elevated myocardial infarction) ([place])    2004 secondary to stress induced cardiomyopathy. Normal LV function by repeatr echo.  . Peripheral edema   . Thyroid disease    hypothyroidism  . Vitamin D deficiency    Past Surgical History:  Procedure Laterality Date  . ABDOMINAL HYSTERECTOMY    . CHOLECYSTECTOMY    . MASTECTOMY     left   Family History  Problem Relation Age of Onset  . Hypertension Mother   . Heart disease Mother   . Heart attack Father   . Heart disease Sister   . Heart disease Brother   . Cancer Maternal Aunt     breast   . Heart disease Maternal Aunt   . Hyperlipidemia Maternal Aunt   . Hypertension Maternal Aunt   . Heart disease Maternal Uncle   . Hyperlipidemia Maternal Uncle   . Hypertension Maternal Uncle   . Heart disease Maternal Grandmother   . Heart disease Sister   . Heart disease Sister   . Heart disease Brother   .  Diabetes Daughter   . Hypertension     Social History   Occupational History  . Not on file.   Social History Main Topics  . Smoking status: Never Smoker  . Smokeless tobacco: Never Used  . Alcohol use No  . Drug use: No  . Sexual activity: Not Currently    Birth control/ protection: Surgical    Do you feel safe at home?  Yes Are there smokers in your home (other than you)? No  Dietary issues and exercise activities: Current Exercise Habits: Home exercise routine, Type of exercise: walking, Time (Minutes): 15, Frequency (Times/Week): 7, Weekly Exercise (Minutes/Week): 105, Intensity: Mild  Current Dietary habits:  Follow CHO counting diet for diabetics   Objective:    Today's Vitals   10/02/16 1129  BP: 118/68  Pulse: 68  Weight: 162 lb (73.5  kg)  Height: '5\' 2"'$  (1.575 m)   Body mass index is 29.63 kg/m.  Activities of Daily Living In your present state of health, do you have any difficulty performing the following activities: 10/02/2016  Hearing? N  Vision? N  Difficulty concentrating or making decisions? N  Walking or climbing stairs? N  Dressing or bathing? N  Doing errands, shopping? N  Preparing Food and eating ? N  Using the Toilet? N  In the past six months, have you accidently leaked urine? N  Do you have problems with loss of bowel control? N  Managing your Medications? N  Managing your Finances? N  Housekeeping or managing your Housekeeping? N  Some recent data might be hidden     Cardiac Risk Factors include: diabetes mellitus;advanced age (>67mn, >>85women);family history of premature cardiovascular disease  Depression Screen PHQ 2/9 Scores 10/02/2016 01/06/2016 10/01/2015 07/27/2015  PHQ - 2 Score '2 1 1 5  '$ PHQ- 9 Score 2 - - 6    Diabetic Foot Form - Detailed   Diabetic Foot Exam - detailed Diabetic Foot exam was performed with the following findings:  Yes 10/02/2016 11:48 AM  Is there a history of foot ulcer?:  No Can the patient see the bottom of their feet?:  Yes Are the shoes appropriate in style and fit?:  Yes Is there swelling or and abnormal foot shape?:  No Are the toenails long?:  No Are the toenails thick?:  No Do you have pain in calf while walking?:  No Is there a claw toe deformity?:  No Is there elevated skin temparature?:  No Is there limited skin dorsiflexion?:  No Is there foot or ankle muscle weakness?:  No Are the toenails ingrown?:  No Normal Range of Motion:  Yes Pulse Foot Exam completed.:  Yes  Right posterior Tibialias:  Present Left posterior Tibialias:  Present  Right Dorsalis Pedis:  Present Left Dorsalis Pedis:  Present  Sensory Foot Exam Completed.:  Yes Swelling:  No Semmes-Weinstein Monofilament Test R Foot Test Control:  Pos L Foot Test Control:  Pos  R Site  1-Great Toe:  Pos L Site 1-Great Toe:  Pos  R Site 4:  Pos L Site 4:  Pos  R Site 5:  Pos L Site 5:  Pos        Fall Risk Fall Risk  10/02/2016 01/06/2016 10/01/2015 07/27/2015 03/26/2015  Falls in the past year? No No Yes No No  Number falls in past yr: - - 1 - -  Injury with Fall? - - No - -  Risk for fall due to : - - History of fall(s) - -  Cognitive Function: MMSE - Mini Mental State Exam 10/02/2016 10/01/2015  Orientation to time 5 4  Orientation to Place 5 5  Registration 3 3  Attention/ Calculation 5 4  Recall 0 2  Language- name 2 objects 2 2  Language- repeat 1 1  Language- follow 3 step command 3 3  Language- read & follow direction 1 1  Write a sentence 1 1  Copy design 1 1  Total score 27 27    Immunizations and Health Maintenance Immunization History  Administered Date(s) Administered  . Influenza,inj,Quad PF,36+ Mos 09/16/2014, 08/12/2015, 08/22/2016  . Pneumococcal Conjugate-13 12/25/2014  . Pneumococcal Polysaccharide-23 03/17/2013   Health Maintenance Due  Topic Date Due  . Hepatitis C Screening  18-Sep-1946  . OPHTHALMOLOGY EXAM  02/18/1958  . COLON CANCER SCREENING ANNUAL FOBT  03/11/2015  . PAP SMEAR  03/03/2016    Patient Care Team: [name], MD as PCP - General (Family Medicine)  Indicate any recent Medical Services you may have received from other than [facility] providers in the past year (date may be approximate).    Assessment:    Annual Wellness Visit  Type 2 DM, controlled but increase in A1c.  Screening Tests Health Maintenance  Topic Date Due  . Hepatitis C Screening  07-05-1946  . OPHTHALMOLOGY EXAM  02/18/1958  . COLON CANCER SCREENING ANNUAL FOBT  03/11/2015  . PAP SMEAR  03/03/2016  . DEXA SCAN  03/25/2017  . HEMOGLOBIN A1C  04/01/2017  . MAMMOGRAM  04/06/2017  . FOOT EXAM  10/02/2017  . COLONOSCOPY  12/25/2021  . TETANUS/TDAP  11/01/2023  . INFLUENZA VACCINE  Addressed  . ZOSTAVAX  Completed  . PNA vac Low Risk  Adult  Completed        Plan:   During the course of the visit [place] was educated and counseled about the following appropriate screening and preventive services:   Vaccines to include Pneumoccal, Influenza, Td, Zostavax - UTD  Colorectal cancer screening - UTD  Cardiovascular disease screening - last EKG was 2014  Lipids checked today  Diabetes - A1c increased from 6.0 to 7.2.  Discussed dietary changes  Bone Denisty / Osteoporosis Screening - UTD  Mammogram - due.  Order sent  PAP - last 2 years ago due next year  Glaucoma screening / Diabetic Eye Exam - needed; reminded patient to get and discussed importance of yearly eye exams  Nutrition counseling - reviewed CHO amount in foods and serving sizes  Advanced Directives - UTD  Physical Activity - recommended daily walking or other exercise which would help with BG and weight.   Diabetic food exam performed today  Orders Placed This Encounter  Procedures  . Bayer DCA Hb A1c Waived  . Lipid panel  . CMP14+EGFR  . Hepatitis C antibody  . CMP14+EGFR  . Lipid panel     Patient Instructions (the written plan) were given to the patient.   Cherre Robins, PharmD   10/07/2016

## 2016-10-11 ENCOUNTER — Other Ambulatory Visit: Payer: Self-pay | Admitting: Family Medicine

## 2016-10-11 NOTE — Telephone Encounter (Signed)
Authorize 30 days only. Then contact the patient letting them know that they will need an appointment before any further prescriptions can be sent in. 

## 2016-11-08 ENCOUNTER — Other Ambulatory Visit: Payer: Self-pay | Admitting: Family Medicine

## 2016-11-15 ENCOUNTER — Other Ambulatory Visit: Payer: Self-pay | Admitting: Family Medicine

## 2016-12-14 ENCOUNTER — Other Ambulatory Visit: Payer: Self-pay | Admitting: Family Medicine

## 2017-01-17 ENCOUNTER — Other Ambulatory Visit: Payer: Self-pay | Admitting: Family Medicine

## 2017-01-19 ENCOUNTER — Encounter (INDEPENDENT_AMBULATORY_CARE_PROVIDER_SITE_OTHER): Payer: Self-pay

## 2017-01-19 ENCOUNTER — Encounter: Payer: Self-pay | Admitting: Family Medicine

## 2017-01-19 ENCOUNTER — Ambulatory Visit (INDEPENDENT_AMBULATORY_CARE_PROVIDER_SITE_OTHER): Payer: Medicare Other | Admitting: Family Medicine

## 2017-01-19 VITALS — BP 137/76 | HR 67 | Temp 98.0°F | Ht 62.0 in | Wt 161.0 lb

## 2017-01-19 DIAGNOSIS — E039 Hypothyroidism, unspecified: Secondary | ICD-10-CM | POA: Diagnosis not present

## 2017-01-19 DIAGNOSIS — I10 Essential (primary) hypertension: Secondary | ICD-10-CM

## 2017-01-19 DIAGNOSIS — E119 Type 2 diabetes mellitus without complications: Secondary | ICD-10-CM

## 2017-01-19 DIAGNOSIS — Z794 Long term (current) use of insulin: Secondary | ICD-10-CM

## 2017-01-19 DIAGNOSIS — E785 Hyperlipidemia, unspecified: Secondary | ICD-10-CM | POA: Diagnosis not present

## 2017-01-19 MED ORDER — LEVOTHYROXINE SODIUM 100 MCG PO TABS: ORAL_TABLET | ORAL | 4 refills | Status: DC

## 2017-01-19 MED ORDER — BENAZEPRIL HCL 20 MG PO TABS: 20.0000 mg | ORAL_TABLET | Freq: Every day | ORAL | 4 refills | Status: DC

## 2017-01-19 NOTE — Progress Notes (Signed)
Subjective:  Patient ID: Brianna Weber, female    DOB: [birth year]  Age: 71 y.o. MRN: [id number]  CC: Diabetes (pt here today for routine follow up on her diabetes. No other concerns voiced.)   HPI Brianna Weber presents for  follow-up of hypertension. Patient has no history of headache chest pain or shortness of breath or recent cough. Patient also denies symptoms of TIA such as numbness weakness lateralizing. Patient checks  blood pressure at home. Recent readings have been good. Patient denies side effects from medication. States taking it regularly.  Patient also  in for follow-up of elevated cholesterol. Doing well without complaints on current medication. Denies side effects of statin including myalgia and arthralgia and nausea. Also in today for liver function testing. Currently no chest pain, shortness of breath or other cardiovascular related symptoms noted.  Follow-up of diabetes. Patient does check blood sugar at home. Readings run between 60-70 fasting and 90-100 PP. Patient denies symptoms such as polyuria, polydipsia, excessive hunger, nausea A few significant hypoglycemic spells noted. Medications reviewed. Pt reports taking them regularly. Pt. denies complication/adverse reaction today.   Patient presents for follow-up on  thyroid. The patient has a history of hypothyroidism for many years. It has been stable recently. Pt. denies any change in  voice, loss of hair, heat or cold intolerance. Energy level has been adequate to good. Patient denies constipation and diarrhea. No myxedema. Medication is as noted below. Verified that pt is taking it daily on an empty stomach. Well tolerated.  History Brianna Weber has a past medical history of Allergy; Arrhythmia; Breast cancer ([place]); Breast cancer ([place]); Diabetes mellitus; Diverticulosis of colon; Hyperlipidemia; Hypertension; Myocardial infarct; NSTEMI (non-ST elevated myocardial infarction) ([place]); Peripheral edema; Thyroid  disease; and Vitamin D deficiency.   She has a past surgical history that includes Mastectomy; Cholecystectomy; and Abdominal hysterectomy.   Her family history includes Cancer in her maternal aunt; Diabetes in her daughter; Heart attack in her father; Heart disease in her brother, brother, maternal aunt, maternal grandmother, maternal uncle, mother, sister, sister, and sister; Hyperlipidemia in her maternal aunt and maternal uncle; Hypertension in her maternal aunt, maternal uncle, and mother.She reports that she has never smoked. She has never used smokeless tobacco. She reports that she does not drink alcohol or use drugs.  Current Outpatient Prescriptions on File Prior to Visit  Medication Sig Dispense Refill  . amLODipine (NORVASC) 10 MG tablet TAKE 1 TABLET DAILY 90 tablet 0  . aspirin 81 MG tablet Take 81 mg by mouth daily.      . fexofenadine (ALLEGRA) 180 MG tablet Take 180 mg by mouth daily.      . furosemide (LASIX) 40 MG tablet Take 1 tablet (40 mg total) by mouth daily. 30 tablet 4  . glucose blood (ONE TOUCH ULTRA TEST) test strip Test bid. Dx E11.9 150 each 5  . insulin NPH-regular Human (NOVOLIN 70/30) (70-30) 100 UNIT/ML injection 55 u in AM and 58U each evening 10 mL 11  . isosorbide mononitrate (IMDUR) 60 MG 24 hr tablet TAKE 1 TABLET ONCE A DAY 180 tablet 0  . raloxifene (EVISTA) 60 MG tablet Take 1 tablet (60 mg total) by mouth daily. 30 tablet 5  . simvastatin (ZOCOR) 20 MG tablet TAKE 1 TABLET DAILY 90 tablet 0  . vitamin D, CHOLECALCIFEROL, 400 UNITS tablet Take 400 Units by mouth daily.       No current facility-administered medications on file prior to visit.     ROS  Review of Systems  Constitutional: Negative for activity change, appetite change and fever.  HENT: Negative for congestion, rhinorrhea and sore throat.   Eyes: Negative for visual disturbance.  Respiratory: Negative for cough and shortness of breath.   Cardiovascular: Negative for chest pain and  palpitations.  Gastrointestinal: Negative for abdominal pain, diarrhea and nausea.  Genitourinary: Negative for dysuria.  Musculoskeletal: Negative for arthralgias and myalgias.    Objective:  BP 137/76   Pulse 67   Temp 98 F (36.7 C) (Oral)   Ht _0  (1.575 m)   Wt 161 lb (73 kg)   BMI 29.45 kg/m   BP Readings from Last 3 Encounters:  01/19/17 137/76  10/02/16 118/68  01/06/16 138/67    Wt Readings from Last 3 Encounters:  01/19/17 161 lb (73 kg)  10/02/16 162 lb (73.5 kg)  01/06/16 159 lb 6.4 oz (72.3 kg)     Physical Exam  Constitutional: She is oriented to person, place, and time. She appears well-developed and well-nourished. No distress.  HENT:  Head: Normocephalic and atraumatic.  Right Ear: External ear normal.  Left Ear: External ear normal.  Nose: Nose normal.  Mouth/Throat: Oropharynx is clear and moist.  Eyes: Conjunctivae and EOM are normal. Pupils are equal, round, and reactive to light.  Neck: Normal range of motion. Neck supple. No thyromegaly present.  Cardiovascular: Normal rate, regular rhythm and normal heart sounds.   No murmur heard. Pulmonary/Chest: Effort normal and breath sounds normal. No respiratory distress. She has no wheezes. She has no rales.  Abdominal: Soft. Bowel sounds are normal. She exhibits no distension. There is no tenderness.  Lymphadenopathy:    She has no cervical adenopathy.  Neurological: She is alert and oriented to person, place, and time. She has normal reflexes.  Skin: Skin is warm and dry.  Psychiatric: She has a normal mood and affect. Her behavior is normal. Judgment and thought content normal.    No components found for: BAYER DCA HB A1C WAIVED    Assessment & Plan:   Brianna Weber was seen today for diabetes.  Diagnoses and all orders for this visit:  Type 2 diabetes mellitus without complication, with long-term current use of insulin (HCC) -     Microalbumin / creatinine urine ratio -     Urinalysis -      Bayer DCA Hb A1c Waived -     Lipid panel -     CMP14+EGFR  Hyperlipidemia with target LDL less than 100 -     Lipid panel -     CMP14+EGFR  Hypothyroidism, unspecified type -     CMP14+EGFR -     TSH + free T4  Essential hypertension -     CMP14+EGFR  Other orders -     benazepril (LOTENSIN) 20 MG tablet; Take 1 tablet (20 mg total) by mouth daily. -     levothyroxine (SYNTHROID, LEVOTHROID) 100 MCG tablet; Take 1 tablet (100 mcg total) by mouth daily.   I have changed Brianna Weber's benazepril. I am also having her maintain her aspirin, fexofenadine, vitamin D (CHOLECALCIFEROL), raloxifene, insulin NPH-regular Human, glucose blood, isosorbide mononitrate, simvastatin, amLODipine, furosemide, and levothyroxine.  Meds ordered this encounter  Medications  . benazepril (LOTENSIN) 20 MG tablet    Sig: Take 1 tablet (20 mg total) by mouth daily.    Dispense:  30 tablet    Refill:  4  . levothyroxine (SYNTHROID, LEVOTHROID) 100 MCG tablet    Sig: Take 1 tablet (100 mcg  total) by mouth daily.    Dispense:  30 tablet    Refill:  4     Follow-up: Return in about 3 months (around 04/21/2017).  [name], M.D.

## 2017-02-07 ENCOUNTER — Encounter: Payer: Medicare Other | Admitting: *Deleted

## 2017-02-07 ENCOUNTER — Encounter: Payer: Self-pay | Admitting: Family Medicine

## 2017-02-07 DIAGNOSIS — Z1231 Encounter for screening mammogram for malignant neoplasm of breast: Secondary | ICD-10-CM | POA: Diagnosis not present

## 2017-02-19 ENCOUNTER — Other Ambulatory Visit: Payer: Self-pay | Admitting: Family Medicine

## 2017-02-23 ENCOUNTER — Other Ambulatory Visit: Payer: Self-pay | Admitting: *Deleted

## 2017-02-23 MED ORDER — BENAZEPRIL HCL 20 MG PO TABS: 20.0000 mg | ORAL_TABLET | Freq: Every day | ORAL | 0 refills | Status: DC

## 2017-03-27 ENCOUNTER — Encounter: Payer: Self-pay | Admitting: Family Medicine

## 2017-03-27 ENCOUNTER — Ambulatory Visit (INDEPENDENT_AMBULATORY_CARE_PROVIDER_SITE_OTHER): Payer: Medicare Other | Admitting: Family Medicine

## 2017-03-27 VITALS — BP 135/78 | HR 63 | Temp 98.0°F | Ht 62.0 in | Wt 157.0 lb

## 2017-03-27 DIAGNOSIS — E039 Hypothyroidism, unspecified: Secondary | ICD-10-CM | POA: Diagnosis not present

## 2017-03-27 DIAGNOSIS — E785 Hyperlipidemia, unspecified: Secondary | ICD-10-CM

## 2017-03-27 DIAGNOSIS — Z794 Long term (current) use of insulin: Secondary | ICD-10-CM

## 2017-03-27 DIAGNOSIS — E119 Type 2 diabetes mellitus without complications: Secondary | ICD-10-CM

## 2017-03-27 DIAGNOSIS — I10 Essential (primary) hypertension: Secondary | ICD-10-CM

## 2017-03-27 LAB — URINALYSIS
BILIRUBIN UA: NEGATIVE
Glucose, UA: NEGATIVE
KETONES UA: NEGATIVE
NITRITE UA: NEGATIVE
RBC, UA: NEGATIVE
Specific Gravity, UA: 1.02 (ref 1.005–1.030)
UUROB: 0.2 mg/dL (ref 0.2–1.0)
pH, UA: 5.5 (ref 5.0–7.5)

## 2017-03-27 LAB — BAYER DCA HB A1C WAIVED: HB A1C (BAYER DCA - WAIVED): 7 % — ABNORMAL HIGH (ref <7.0)

## 2017-03-27 NOTE — Patient Instructions (Signed)
We discussed a new DNA  test for screening for colon cancer.The two are Cologard and Epi proColon. Check with your insurance to see which they cover & let me know.  Always a pleasure to see you! [employer]

## 2017-03-27 NOTE — Progress Notes (Signed)
Subjective:  Patient ID: Brianna Weber, female    DOB: May 31, 1946  Age: 71 y.o. MRN: [id number]  CC: Diabetes (pt here today needing her A1C checked )   HPI Brianna Weber presents forFollow-up of diabetes. Patient checks blood sugar at home.   50-75 fasting and 95-100 postprandial. She'll occasionally have a cookie for a snack. However, she denies all symptoms related to hypoglycemia. Patient denies symptoms such as polyuria, polydipsia, excessive hunger, nausea No significant hypoglycemic spells noted. Medications reviewed. Pt reports taking them regularly without complication/adverse reaction being reported today.  Checking feet daily. Last eye appt due and patient will arrange.  Patient presents for follow-up on  thyroid. The patient has a history of hypothyroidism for many years. Pt. denies any change in  voice, loss of hair, heat or cold intolerance. Energy level has been adequate to good. Patient denies constipation and diarrhea. No myxedema. Medication is as noted below. Verified that pt is taking it daily on an empty stomach. Well tolerated.   follow-up of hypertension. Patient has no history of headache chest pain or shortness of breath or recent cough. Patient also denies symptoms of TIA such as numbness weakness lateralizing. Patient checks  blood pressure at home and has not had any elevated readings recently. Patient denies side effects from his medication. States taking it regularly. Patient in for follow-up of elevated cholesterol. Doing well without complaints on current medication. Denies side effects of statin including myalgia and arthralgia and nausea. Also in today for liver function testing. Currently no chest pain, shortness of breath or other cardiovascular related symptoms noted. History Brianna Weber has a past medical history of Allergy; Arrhythmia; Breast cancer ([place]); Breast cancer ([place]); Diabetes mellitus; Diverticulosis of colon; Hyperlipidemia;  Hypertension; Myocardial infarct [facility]); NSTEMI (non-ST elevated myocardial infarction) ([place]); Peripheral edema; Thyroid disease; and Vitamin D deficiency.   She has a past surgical history that includes Mastectomy; Cholecystectomy; and Abdominal hysterectomy.   Her family history includes Cancer in her maternal aunt; Diabetes in her daughter; Heart attack in her father; Heart disease in her brother, brother, maternal aunt, maternal grandmother, maternal uncle, mother, sister, sister, and sister; Hyperlipidemia in her maternal aunt and maternal uncle; Hypertension in her maternal aunt, maternal uncle, and mother.She reports that she has never smoked. She has never used smokeless tobacco. She reports that she does not drink alcohol or use drugs.  Current Outpatient Prescriptions on File Prior to Visit  Medication Sig Dispense Refill  . amLODipine (NORVASC) 10 MG tablet TAKE 1 TABLET DAILY 90 tablet 1  . aspirin 81 MG tablet Take 81 mg by mouth daily.      . benazepril (LOTENSIN) 20 MG tablet Take 1 tablet (20 mg total) by mouth daily. 90 tablet 0  . fexofenadine (ALLEGRA) 180 MG tablet Take 180 mg by mouth daily.      . furosemide (LASIX) 40 MG tablet Take 1 tablet (40 mg total) by mouth daily. 30 tablet 4  . glucose blood (ONE TOUCH ULTRA TEST) test strip Test bid. Dx E11.9 150 each 5  . insulin NPH-regular Human (NOVOLIN 70/30) (70-30) 100 UNIT/ML injection 55 u in AM and 58U each evening 10 mL 11  . isosorbide mononitrate (IMDUR) 60 MG 24 hr tablet TAKE 1 TABLET ONCE A DAY 180 tablet 0  . levothyroxine (SYNTHROID, LEVOTHROID) 100 MCG tablet Take 1 tablet (100 mcg total) by mouth daily. 30 tablet 4  . raloxifene (EVISTA) 60 MG tablet Take 1 tablet (60 mg total) by  mouth daily. 30 tablet 5  . simvastatin (ZOCOR) 20 MG tablet TAKE 1 TABLET DAILY 90 tablet 1  . vitamin D, CHOLECALCIFEROL, 400 UNITS tablet Take 400 Units by mouth daily.       No current facility-administered medications on file  prior to visit.     ROS Review of Systems  Constitutional: Negative for activity change, appetite change and fever.  HENT: Negative for congestion, rhinorrhea and sore throat.   Eyes: Negative for visual disturbance.  Respiratory: Negative for cough and shortness of breath.   Cardiovascular: Negative for chest pain and palpitations.  Gastrointestinal: Negative for abdominal pain, diarrhea and nausea.  Genitourinary: Negative for dysuria.  Musculoskeletal: Negative for arthralgias and myalgias.    Objective:  BP 135/78   Pulse 63   Temp 98 F (36.7 C) (Oral)   Ht '5\' 2"'$  (1.575 m)   Wt 157 lb (71.2 kg)   BMI 28.72 kg/m   BP Readings from Last 3 Encounters:  03/27/17 135/78  01/19/17 137/76  10/02/16 118/68    Wt Readings from Last 3 Encounters:  03/27/17 157 lb (71.2 kg)  01/19/17 161 lb (73 kg)  10/02/16 162 lb (73.5 kg)     Physical Exam  Constitutional: She is oriented to person, place, and time. She appears well-developed and well-nourished. No distress.  HENT:  Head: Normocephalic and atraumatic.  Right Ear: External ear normal.  Left Ear: External ear normal.  Nose: Nose normal.  Mouth/Throat: Oropharynx is clear and moist.  Eyes: Conjunctivae and EOM are normal. Pupils are equal, round, and reactive to light.  Neck: Normal range of motion. Neck supple. No thyromegaly present.  Cardiovascular: Normal rate, regular rhythm and normal heart sounds.   No murmur heard. Pulmonary/Chest: Effort normal and breath sounds normal. No respiratory distress. She has no wheezes. She has no rales.  Abdominal: Soft. Bowel sounds are normal. She exhibits no distension. There is no tenderness.  Lymphadenopathy:    She has no cervical adenopathy.  Neurological: She is alert and oriented to person, place, and time. She has normal reflexes.  Skin: Skin is warm and dry.  Psychiatric: She has a normal mood and affect. Her behavior is normal. Judgment and thought content normal.     Diabetic Foot Exam - Simple   Simple Foot Form Diabetic Foot exam was performed with the following findings:  Yes 03/27/2017  9:00 AM  Visual Inspection No deformities, no ulcerations, no other skin breakdown bilaterally:  Yes Sensation Testing Intact to touch and monofilament testing bilaterally:  Yes Pulse Check Posterior Tibialis and Dorsalis pulse intact bilaterally:  Yes Comments         Assessment & Plan:   Brianna Weber was seen today for diabetes.  Diagnoses and all orders for this visit:  Hypothyroidism, unspecified type -     CMP14+EGFR -     TSH + free T4  Hyperlipidemia with target LDL less than 100 -     CMP14+EGFR -     Lipid panel  Essential hypertension -     CMP14+EGFR  Type 2 diabetes mellitus without complication, with long-term current use of insulin (HCC) -     CBC with Differential/Platelet -     CMP14+EGFR -     Lipid panel -     TSH + free T4 -     Bayer DCA Hb A1c Waived -     Microalbumin / creatinine urine ratio -     Urinalysis      I  am having Brianna Weber maintain her aspirin, fexofenadine, vitamin D (CHOLECALCIFEROL), raloxifene, insulin NPH-regular Human, glucose blood, isosorbide mononitrate, furosemide, levothyroxine, simvastatin, amLODipine, and benazepril.  No orders of the defined types were placed in this encounter.    Follow-up: Return in about 3 months (around 06/27/2017).  [name], M.D.

## 2017-03-28 LAB — CMP14+EGFR
A/G RATIO: 1.4 (ref 1.2–2.2)
ALT: 18 IU/L (ref 0–32)
AST: 21 IU/L (ref 0–40)
Albumin: 4.3 g/dL (ref 3.6–4.8)
Alkaline Phosphatase: 122 IU/L — ABNORMAL HIGH (ref 39–117)
BUN / CREAT RATIO: 14 (ref 12–28)
BUN: 15 mg/dL (ref 8–27)
Bilirubin Total: 0.4 mg/dL (ref 0.0–1.2)
CALCIUM: 10 mg/dL (ref 8.7–10.3)
CO2: 24 mmol/L (ref 18–29)
Chloride: 104 mmol/L (ref 96–106)
Creatinine, Ser: 1.05 mg/dL — ABNORMAL HIGH (ref 0.57–1.00)
GFR, EST AFRICAN AMERICAN: 63 mL/min/{1.73_m2} (ref >59)
GFR, EST NON AFRICAN AMERICAN: 54 mL/min/{1.73_m2} — AB (ref >59)
Globulin, Total: 3 g/dL (ref 1.5–4.5)
Glucose: 142 mg/dL — ABNORMAL HIGH (ref 65–99)
POTASSIUM: 5 mmol/L (ref 3.5–5.2)
Sodium: 141 mmol/L (ref 134–144)
TOTAL PROTEIN: 7.3 g/dL (ref 6.0–8.5)

## 2017-03-28 LAB — CBC WITH DIFFERENTIAL/PLATELET
BASOS ABS: 0.1 10*3/uL (ref 0.0–0.2)
Basos: 1 %
EOS (ABSOLUTE): 0.3 10*3/uL (ref 0.0–0.4)
Eos: 5 %
HEMOGLOBIN: 12.2 g/dL (ref 11.1–15.9)
Hematocrit: 37.1 % (ref 34.0–46.6)
IMMATURE GRANS (ABS): 0 10*3/uL (ref 0.0–0.1)
Immature Granulocytes: 0 %
LYMPHS ABS: 1.6 10*3/uL (ref 0.7–3.1)
LYMPHS: 24 %
MCH: 27.1 pg (ref 26.6–33.0)
MCHC: 32.9 g/dL (ref 31.5–35.7)
MCV: 82 fL (ref 79–97)
MONOCYTES: 7 %
Monocytes Absolute: 0.5 10*3/uL (ref 0.1–0.9)
NEUTROS ABS: 4.2 10*3/uL (ref 1.4–7.0)
Neutrophils: 63 %
Platelets: 366 10*3/uL (ref 150–379)
RBC: 4.5 x10E6/uL (ref 3.77–5.28)
RDW: 14.2 % (ref 12.3–15.4)
WBC: 6.7 10*3/uL (ref 3.4–10.8)

## 2017-03-28 LAB — LIPID PANEL
CHOL/HDL RATIO: 2.7 ratio (ref 0.0–4.4)
Cholesterol, Total: 170 mg/dL (ref 100–199)
HDL: 64 mg/dL (ref >39)
LDL Calculated: 74 mg/dL (ref 0–99)
Triglycerides: 160 mg/dL — ABNORMAL HIGH (ref 0–149)
VLDL Cholesterol Cal: 32 mg/dL (ref 5–40)

## 2017-03-28 LAB — MICROALBUMIN / CREATININE URINE RATIO
Creatinine, Urine: 143.2 mg/dL
Microalb/Creat Ratio: 9.3 mg/g creat (ref 0.0–30.0)
Microalbumin, Urine: 13.3 ug/mL

## 2017-03-28 LAB — TSH+FREE T4
FREE T4: 1.39 ng/dL (ref 0.82–1.77)
TSH: 2.44 u[IU]/mL (ref 0.450–4.500)

## 2017-04-19 LAB — HM DIABETES EYE EXAM

## 2017-04-27 ENCOUNTER — Other Ambulatory Visit: Payer: Self-pay | Admitting: Family Medicine

## 2017-04-27 MED ORDER — GLUCOSE BLOOD VI STRP: ORAL_STRIP | 5 refills | Status: DC

## 2017-04-27 NOTE — Telephone Encounter (Signed)
done

## 2017-05-23 ENCOUNTER — Other Ambulatory Visit: Payer: Self-pay | Admitting: Family Medicine

## 2017-06-22 ENCOUNTER — Other Ambulatory Visit: Payer: Self-pay | Admitting: Family Medicine

## 2017-09-06 ENCOUNTER — Ambulatory Visit (INDEPENDENT_AMBULATORY_CARE_PROVIDER_SITE_OTHER): Payer: Medicare Other

## 2017-09-06 DIAGNOSIS — Z23 Encounter for immunization: Secondary | ICD-10-CM | POA: Diagnosis not present

## 2017-09-13 ENCOUNTER — Other Ambulatory Visit: Payer: Self-pay | Admitting: Family Medicine

## 2017-09-20 ENCOUNTER — Other Ambulatory Visit: Payer: Self-pay | Admitting: Family Medicine

## 2017-11-09 ENCOUNTER — Other Ambulatory Visit: Payer: Self-pay | Admitting: Family Medicine

## 2017-11-09 NOTE — Telephone Encounter (Signed)
Last seen 03/27/17  Dr Livia Snellen

## 2017-12-13 ENCOUNTER — Other Ambulatory Visit: Payer: Self-pay | Admitting: Family Medicine

## 2017-12-17 ENCOUNTER — Other Ambulatory Visit: Payer: Self-pay | Admitting: Family Medicine

## 2017-12-17 ENCOUNTER — Encounter: Payer: Self-pay | Admitting: Family Medicine

## 2017-12-17 ENCOUNTER — Ambulatory Visit (INDEPENDENT_AMBULATORY_CARE_PROVIDER_SITE_OTHER): Payer: Medicare Other | Admitting: Family Medicine

## 2017-12-17 VITALS — BP 140/74 | HR 63 | Temp 97.3°F | Ht 62.0 in | Wt 162.4 lb

## 2017-12-17 DIAGNOSIS — I10 Essential (primary) hypertension: Secondary | ICD-10-CM

## 2017-12-17 DIAGNOSIS — R399 Unspecified symptoms and signs involving the genitourinary system: Secondary | ICD-10-CM | POA: Diagnosis not present

## 2017-12-17 DIAGNOSIS — E039 Hypothyroidism, unspecified: Secondary | ICD-10-CM | POA: Diagnosis not present

## 2017-12-17 DIAGNOSIS — Z794 Long term (current) use of insulin: Secondary | ICD-10-CM | POA: Diagnosis not present

## 2017-12-17 DIAGNOSIS — E119 Type 2 diabetes mellitus without complications: Secondary | ICD-10-CM

## 2017-12-17 DIAGNOSIS — E785 Hyperlipidemia, unspecified: Secondary | ICD-10-CM

## 2017-12-17 LAB — URINALYSIS
Bilirubin, UA: NEGATIVE
GLUCOSE, UA: NEGATIVE
Nitrite, UA: POSITIVE — AB
SPEC GRAV UA: 1.02 (ref 1.005–1.030)
Urobilinogen, Ur: 2 mg/dL — ABNORMAL HIGH (ref 0.2–1.0)
pH, UA: 6 (ref 5.0–7.5)

## 2017-12-17 LAB — BAYER DCA HB A1C WAIVED: HB A1C (BAYER DCA - WAIVED): 7 % — ABNORMAL HIGH (ref <7.0)

## 2017-12-17 MED ORDER — FUROSEMIDE 40 MG PO TABS: ORAL_TABLET | ORAL | 5 refills | Status: DC

## 2017-12-17 MED ORDER — RALOXIFENE HCL 60 MG PO TABS: 60.0000 mg | ORAL_TABLET | Freq: Every day | ORAL | 5 refills | Status: DC

## 2017-12-17 MED ORDER — ISOSORBIDE MONONITRATE ER 60 MG PO TB24: 60.0000 mg | ORAL_TABLET | Freq: Every day | ORAL | 1 refills | Status: DC

## 2017-12-17 MED ORDER — LEVOTHYROXINE SODIUM 100 MCG PO TABS: 100.0000 ug | ORAL_TABLET | Freq: Every day | ORAL | 1 refills | Status: DC

## 2017-12-17 MED ORDER — GLUCOSE BLOOD VI STRP: ORAL_STRIP | 5 refills | Status: DC

## 2017-12-17 MED ORDER — AMLODIPINE BESYLATE 10 MG PO TABS: 10.0000 mg | ORAL_TABLET | Freq: Every day | ORAL | 1 refills | Status: DC

## 2017-12-17 MED ORDER — BENAZEPRIL HCL 20 MG PO TABS: 20.0000 mg | ORAL_TABLET | Freq: Every day | ORAL | 1 refills | Status: DC

## 2017-12-17 MED ORDER — SIMVASTATIN 20 MG PO TABS: 20.0000 mg | ORAL_TABLET | Freq: Every day | ORAL | 1 refills | Status: DC

## 2017-12-17 MED ORDER — SULFAMETHOXAZOLE-TRIMETHOPRIM 800-160 MG PO TABS: 1.0000 | ORAL_TABLET | Freq: Two times a day (BID) | ORAL | 0 refills | Status: DC

## 2017-12-17 NOTE — Addendum Note (Signed)
Addended by: Marylin Crosby on: 12/17/2017 05:07 PM   Modules accepted: Orders

## 2017-12-17 NOTE — Progress Notes (Signed)
Subjective:  Patient ID: Brianna Weber,  female    DOB: 17-Nov-1945  Age: 72 y.o.    CC: Diabetes (pt here today for routine follow up of her thyroid and diabetes, no other concerns voiced.)   HPI Brianna Weber presents for  follow-up of hypertension. Patient has no history of headache chest pain or shortness of breath or recent cough. Patient also denies symptoms of TIA such as numbness weakness lateralizing. Patient checks  blood pressure at home. Recent readings have been good Patient denies side effects from medication. States taking it regularly.  Patient also  in for follow-up of elevated cholesterol. Doing well without complaints on current medication. Denies side effects of statin including myalgia and arthralgia and nausea. Also in today for liver function testing. Currently no chest pain, shortness of breath or other cardiovascular related symptoms noted.  Follow-up of diabetes. Patient does check blood sugar at home. Readings run pretty good she says.  She forgot to bring a book.  She thinks most are in the low 100s. Patient denies symptoms such as polyuria, polydipsia, excessive hunger, nausea Patient was having some hypoglycemic spells that after weak and shaky so she decreased her insulin to 65 units of Novolin 70/30 in the morning and 45 at suppertime. Medications reviewed. Pt reports taking them regularly. Pt. denies complication/adverse reaction today.   Patient presents for follow-up on  thyroid. The patient has a history of hypothyroidism for many years. It has been stable recently. Pt. denies any change in  voice, loss of hair, heat or cold intolerance. Energy level has been adequate to good. Patient denies constipation and diarrhea. No myxedema. Medication is as noted below. Verified that pt is taking it daily on an empty stomach. Well tolerated. History Brianna Weber has a past medical history of Allergy, Arrhythmia, Breast cancer ([place]), Breast cancer ([place]),  Diabetes mellitus, Diverticulosis of colon, Hyperlipidemia, Hypertension, Myocardial infarct [facility]), NSTEMI (non-ST elevated myocardial infarction) ([place]), Peripheral edema, Thyroid disease, and Vitamin D deficiency.   She has a past surgical history that includes Mastectomy; Cholecystectomy; and Abdominal hysterectomy.   Her family history includes Cancer in her maternal aunt; Diabetes in her daughter; Heart attack in her father; Heart disease in her brother, brother, maternal aunt, maternal grandmother, maternal uncle, mother, sister, sister, and sister; Hyperlipidemia in her maternal aunt and maternal uncle; Hypertension in her maternal aunt, maternal uncle, mother, and unknown relative.She reports that  has never smoked. she has never used smokeless tobacco. She reports that she does not drink alcohol or use drugs.  Current Outpatient Medications on File Prior to Visit  Medication Sig Dispense Refill  . aspirin 81 MG tablet Take 81 mg by mouth daily.      . fexofenadine (ALLEGRA) 180 MG tablet Take 180 mg by mouth daily.      . insulin NPH-regular Human (NOVOLIN 70/30) (70-30) 100 UNIT/ML injection 55 u in AM and 58U each evening 10 mL 11  . vitamin D, CHOLECALCIFEROL, 400 UNITS tablet Take 400 Units by mouth daily.       No current facility-administered medications on file prior to visit.     ROS Review of Systems  Constitutional: Negative for activity change, appetite change and fever.  HENT: Negative for congestion, rhinorrhea and sore throat.   Eyes: Negative for visual disturbance.  Respiratory: Negative for cough and shortness of breath.   Cardiovascular: Negative for chest pain and palpitations.  Gastrointestinal: Negative for abdominal pain, diarrhea and nausea.  Genitourinary: Negative for  dysuria.  Musculoskeletal: Negative for arthralgias and myalgias.    Objective:  BP 140/74   Pulse 63   Temp (!) 97.3 F (36.3 C) (Oral)   Ht 5' 2" (1.575 m)   Wt 162 lb 6 oz (73.7 kg)    BMI 29.70 kg/m   BP Readings from Last 3 Encounters:  12/17/17 140/74  03/27/17 135/78  01/19/17 137/76    Wt Readings from Last 3 Encounters:  12/17/17 162 lb 6 oz (73.7 kg)  03/27/17 157 lb (71.2 kg)  01/19/17 161 lb (73 kg)     Physical Exam  Constitutional: She is oriented to person, place, and time. She appears well-developed and well-nourished. No distress.  HENT:  Head: Normocephalic and atraumatic.  Right Ear: External ear normal.  Left Ear: External ear normal.  Nose: Nose normal.  Mouth/Throat: Oropharynx is clear and moist.  Eyes: Conjunctivae and EOM are normal. Pupils are equal, round, and reactive to light.  Neck: Normal range of motion. Neck supple. No thyromegaly present.  Cardiovascular: Normal rate, regular rhythm and normal heart sounds.  No murmur heard. Pulmonary/Chest: Effort normal and breath sounds normal. No respiratory distress. She has no wheezes. She has no rales.  Abdominal: Soft. Bowel sounds are normal. She exhibits no distension. There is no tenderness.  Lymphadenopathy:    She has no cervical adenopathy.  Neurological: She is alert and oriented to person, place, and time. She has normal reflexes.  Skin: Skin is warm and dry.  Psychiatric: She has a normal mood and affect. Her behavior is normal. Judgment and thought content normal.    Diabetic Foot Exam - Simple   Simple Foot Form Diabetic Foot exam was performed with the following findings:  Yes 12/17/2017  9:22 AM  Visual Inspection No deformities, no ulcerations, no other skin breakdown bilaterally:  Yes Sensation Testing Intact to touch and monofilament testing bilaterally:  Yes Pulse Check Posterior Tibialis and Dorsalis pulse intact bilaterally:  Yes Comments       Assessment & Plan:   [place] was seen today for diabetes.  Diagnoses and all orders for this visit:  Type 2 diabetes mellitus without complication, with long-term current use of insulin (HCC) -     Bayer  DCA Hb A1c Waived -     CBC with Differential/Platelet -     CMP14+EGFR -     Lipid panel -     TSH + free T4 -     Urinalysis -     Microalbumin / creatinine urine ratio  Essential hypertension -     CBC with Differential/Platelet -     Urinalysis  Hypothyroidism, unspecified type -     CBC with Differential/Platelet -     TSH + free T4  Hyperlipidemia with target LDL less than 100 -     CBC with Differential/Platelet -     CMP14+EGFR -     Lipid panel  Other orders -     amLODipine (NORVASC) 10 MG tablet; Take 1 tablet (10 mg total) by mouth daily. -     benazepril (LOTENSIN) 20 MG tablet; Take 1 tablet (20 mg total) by mouth daily. -     furosemide (LASIX) 40 MG tablet; Take 1 tablet (40 mg total) by mouth daily. -     glucose blood (ONE TOUCH ULTRA TEST) test strip; Test bid. Dx E11.9 -     isosorbide mononitrate (IMDUR) 60 MG 24 hr tablet; Take 1 tablet (60 mg total) by mouth daily. -  levothyroxine (SYNTHROID, LEVOTHROID) 100 MCG tablet; Take 1 tablet (100 mcg total) by mouth daily. -     raloxifene (EVISTA) 60 MG tablet; Take 1 tablet (60 mg total) by mouth daily. -     simvastatin (ZOCOR) 20 MG tablet; Take 1 tablet (20 mg total) by mouth daily.   I have changed Brianna Weber's amLODipine, isosorbide mononitrate, levothyroxine, and simvastatin. I am also having her maintain her aspirin, fexofenadine, vitamin D (CHOLECALCIFEROL), insulin NPH-regular Human, benazepril, furosemide, glucose blood, and raloxifene.  Meds ordered this encounter  Medications  . amLODipine (NORVASC) 10 MG tablet    Sig: Take 1 tablet (10 mg total) by mouth daily.    Dispense:  90 tablet    Refill:  1  . benazepril (LOTENSIN) 20 MG tablet    Sig: Take 1 tablet (20 mg total) by mouth daily.    Dispense:  90 tablet    Refill:  1  . furosemide (LASIX) 40 MG tablet    Sig: Take 1 tablet (40 mg total) by mouth daily.    Dispense:  30 tablet    Refill:  5  . glucose blood (ONE TOUCH  ULTRA TEST) test strip    Sig: Test bid. Dx E11.9    Dispense:  150 each    Refill:  5  . isosorbide mononitrate (IMDUR) 60 MG 24 hr tablet    Sig: Take 1 tablet (60 mg total) by mouth daily.    Dispense:  90 tablet    Refill:  1  . levothyroxine (SYNTHROID, LEVOTHROID) 100 MCG tablet    Sig: Take 1 tablet (100 mcg total) by mouth daily.    Dispense:  90 tablet    Refill:  1  . raloxifene (EVISTA) 60 MG tablet    Sig: Take 1 tablet (60 mg total) by mouth daily.    Dispense:  30 tablet    Refill:  5  . simvastatin (ZOCOR) 20 MG tablet    Sig: Take 1 tablet (20 mg total) by mouth daily.    Dispense:  90 tablet    Refill:  1     Follow-up: No Follow-up on file.  [name], M.D.

## 2017-12-18 ENCOUNTER — Other Ambulatory Visit: Payer: Self-pay | Admitting: Family Medicine

## 2017-12-18 LAB — CBC WITH DIFFERENTIAL/PLATELET
Basophils Absolute: 0.1 10*3/uL (ref 0.0–0.2)
Basos: 1 %
EOS (ABSOLUTE): 0.4 10*3/uL (ref 0.0–0.4)
Eos: 5 %
Hematocrit: 38 % (ref 34.0–46.6)
Hemoglobin: 12.6 g/dL (ref 11.1–15.9)
Immature Grans (Abs): 0 10*3/uL (ref 0.0–0.1)
Immature Granulocytes: 0 %
Lymphocytes Absolute: 1.6 10*3/uL (ref 0.7–3.1)
Lymphs: 22 %
MCH: 27.6 pg (ref 26.6–33.0)
MCHC: 33.2 g/dL (ref 31.5–35.7)
MCV: 83 fL (ref 79–97)
Monocytes Absolute: 0.6 10*3/uL (ref 0.1–0.9)
Monocytes: 8 %
Neutrophils Absolute: 4.8 10*3/uL (ref 1.4–7.0)
Neutrophils: 64 %
Platelets: 360 10*3/uL (ref 150–379)
RBC: 4.57 x10E6/uL (ref 3.77–5.28)
RDW: 13.4 % (ref 12.3–15.4)
WBC: 7.5 10*3/uL (ref 3.4–10.8)

## 2017-12-18 LAB — TSH+FREE T4
Free T4: 1.33 ng/dL (ref 0.82–1.77)
TSH: 7.12 u[IU]/mL — ABNORMAL HIGH (ref 0.450–4.500)

## 2017-12-18 LAB — CMP14+EGFR
ALBUMIN: 4.3 g/dL (ref 3.6–4.8)
ALT: 19 IU/L (ref 0–32)
AST: 24 IU/L (ref 0–40)
Albumin/Globulin Ratio: 1.5 (ref 1.2–2.2)
Alkaline Phosphatase: 120 IU/L — ABNORMAL HIGH (ref 39–117)
BUN / CREAT RATIO: 13 (ref 12–28)
BUN: 15 mg/dL (ref 8–27)
Bilirubin Total: 0.6 mg/dL (ref 0.0–1.2)
CALCIUM: 9.8 mg/dL (ref 8.7–10.3)
CO2: 22 mmol/L (ref 20–29)
CREATININE: 1.18 mg/dL — AB (ref 0.57–1.00)
Chloride: 106 mmol/L (ref 96–106)
GFR, EST AFRICAN AMERICAN: 54 mL/min/{1.73_m2} — AB (ref >59)
GFR, EST NON AFRICAN AMERICAN: 47 mL/min/{1.73_m2} — AB (ref >59)
GLOBULIN, TOTAL: 2.9 g/dL (ref 1.5–4.5)
Glucose: 103 mg/dL — ABNORMAL HIGH (ref 65–99)
Potassium: 5.3 mmol/L — ABNORMAL HIGH (ref 3.5–5.2)
SODIUM: 142 mmol/L (ref 134–144)
TOTAL PROTEIN: 7.2 g/dL (ref 6.0–8.5)

## 2017-12-18 LAB — LIPID PANEL
Chol/HDL Ratio: 2.3 ratio (ref 0.0–4.4)
Cholesterol, Total: 162 mg/dL (ref 100–199)
HDL: 71 mg/dL (ref >39)
LDL Calculated: 71 mg/dL (ref 0–99)
Triglycerides: 98 mg/dL (ref 0–149)
VLDL Cholesterol Cal: 20 mg/dL (ref 5–40)

## 2017-12-18 LAB — MICROALBUMIN / CREATININE URINE RATIO
Creatinine, Urine: 153.3 mg/dL
Microalb/Creat Ratio: 9 mg/g creat (ref 0.0–30.0)
Microalbumin, Urine: 13.8 ug/mL

## 2017-12-18 MED ORDER — LEVOTHYROXINE SODIUM 112 MCG PO TABS
112.0000 ug | ORAL_TABLET | Freq: Every day | ORAL | 1 refills | Status: DC
Start: 2017-12-18 — End: 2018-03-19

## 2017-12-18 NOTE — Addendum Note (Signed)
Addended byCarrolyn Leigh on: 12/18/2017 03:43 PM   Modules accepted: Orders

## 2017-12-19 LAB — URINE CULTURE

## 2017-12-24 ENCOUNTER — Other Ambulatory Visit: Payer: Self-pay | Admitting: Family Medicine

## 2017-12-25 ENCOUNTER — Other Ambulatory Visit: Payer: Self-pay | Admitting: Family Medicine

## 2018-01-29 ENCOUNTER — Encounter: Payer: Self-pay | Admitting: Family Medicine

## 2018-03-15 ENCOUNTER — Ambulatory Visit: Payer: Medicare Other | Admitting: Family Medicine

## 2018-03-19 ENCOUNTER — Ambulatory Visit (INDEPENDENT_AMBULATORY_CARE_PROVIDER_SITE_OTHER): Payer: Medicare Other | Admitting: Family Medicine

## 2018-03-19 ENCOUNTER — Encounter: Payer: Self-pay | Admitting: Family Medicine

## 2018-03-19 VITALS — BP 125/66 | HR 62 | Temp 97.9°F | Ht 62.0 in | Wt 156.1 lb

## 2018-03-19 DIAGNOSIS — Z794 Long term (current) use of insulin: Secondary | ICD-10-CM | POA: Diagnosis not present

## 2018-03-19 DIAGNOSIS — E039 Hypothyroidism, unspecified: Secondary | ICD-10-CM

## 2018-03-19 DIAGNOSIS — E785 Hyperlipidemia, unspecified: Secondary | ICD-10-CM

## 2018-03-19 DIAGNOSIS — E119 Type 2 diabetes mellitus without complications: Secondary | ICD-10-CM

## 2018-03-19 LAB — BAYER DCA HB A1C WAIVED: HB A1C: 6.5 % (ref <7.0)

## 2018-03-19 MED ORDER — RALOXIFENE HCL 60 MG PO TABS: 60.0000 mg | ORAL_TABLET | Freq: Every day | ORAL | 1 refills | Status: DC

## 2018-03-19 MED ORDER — LEVOTHYROXINE SODIUM 112 MCG PO TABS: 112.0000 ug | ORAL_TABLET | Freq: Every day | ORAL | 1 refills | Status: DC

## 2018-03-19 MED ORDER — FUROSEMIDE 40 MG PO TABS
ORAL_TABLET | ORAL | 1 refills | Status: DC
Start: 2018-03-19 — End: 2018-04-29

## 2018-03-19 MED ORDER — ISOSORBIDE MONONITRATE ER 60 MG PO TB24: 60.0000 mg | ORAL_TABLET | Freq: Every day | ORAL | 1 refills | Status: DC

## 2018-03-19 MED ORDER — SIMVASTATIN 20 MG PO TABS: 20.0000 mg | ORAL_TABLET | Freq: Every day | ORAL | 1 refills | Status: DC

## 2018-03-19 MED ORDER — AMLODIPINE BESYLATE 10 MG PO TABS: 10.0000 mg | ORAL_TABLET | Freq: Every day | ORAL | 1 refills | Status: DC

## 2018-03-19 MED ORDER — BENAZEPRIL HCL 20 MG PO TABS: 20.0000 mg | ORAL_TABLET | Freq: Every day | ORAL | 1 refills | Status: DC

## 2018-03-20 ENCOUNTER — Encounter: Payer: Self-pay | Admitting: Family Medicine

## 2018-03-20 LAB — CMP14+EGFR
ALT: 17 IU/L (ref 0–32)
AST: 20 IU/L (ref 0–40)
Albumin/Globulin Ratio: 1.1 — ABNORMAL LOW (ref 1.2–2.2)
Albumin: 3.7 g/dL (ref 3.5–4.8)
Alkaline Phosphatase: 107 IU/L (ref 39–117)
BUN/Creatinine Ratio: 9 — ABNORMAL LOW (ref 12–28)
BUN: 12 mg/dL (ref 8–27)
Bilirubin Total: 0.5 mg/dL (ref 0.0–1.2)
CALCIUM: 9.6 mg/dL (ref 8.7–10.3)
CO2: 19 mmol/L — AB (ref 20–29)
Chloride: 106 mmol/L (ref 96–106)
Creatinine, Ser: 1.27 mg/dL — ABNORMAL HIGH (ref 0.57–1.00)
GFR calc Af Amer: 49 mL/min/{1.73_m2} — ABNORMAL LOW (ref >59)
GFR calc non Af Amer: 43 mL/min/{1.73_m2} — ABNORMAL LOW (ref >59)
GLOBULIN, TOTAL: 3.4 g/dL (ref 1.5–4.5)
Glucose: 172 mg/dL — ABNORMAL HIGH (ref 65–99)
Potassium: 4.4 mmol/L (ref 3.5–5.2)
Sodium: 141 mmol/L (ref 134–144)
TOTAL PROTEIN: 7.1 g/dL (ref 6.0–8.5)

## 2018-03-20 LAB — CBC WITH DIFFERENTIAL/PLATELET
Basophils Absolute: 0.1 10*3/uL (ref 0.0–0.2)
Basos: 1 %
EOS (ABSOLUTE): 0.4 10*3/uL (ref 0.0–0.4)
EOS: 5 %
HEMOGLOBIN: 12.2 g/dL (ref 11.1–15.9)
Hematocrit: 37.4 % (ref 34.0–46.6)
IMMATURE GRANS (ABS): 0 10*3/uL (ref 0.0–0.1)
IMMATURE GRANULOCYTES: 0 %
LYMPHS: 23 %
Lymphocytes Absolute: 1.7 10*3/uL (ref 0.7–3.1)
MCH: 28 pg (ref 26.6–33.0)
MCHC: 32.6 g/dL (ref 31.5–35.7)
MCV: 86 fL (ref 79–97)
MONOCYTES: 7 %
MONOS ABS: 0.5 10*3/uL (ref 0.1–0.9)
NEUTROS PCT: 64 %
Neutrophils Absolute: 4.7 10*3/uL (ref 1.4–7.0)
Platelets: 369 10*3/uL (ref 150–379)
RBC: 4.35 x10E6/uL (ref 3.77–5.28)
RDW: 13.9 % (ref 12.3–15.4)
WBC: 7.4 10*3/uL (ref 3.4–10.8)

## 2018-03-20 LAB — LIPID PANEL
CHOL/HDL RATIO: 2.7 ratio (ref 0.0–4.4)
Cholesterol, Total: 168 mg/dL (ref 100–199)
HDL: 62 mg/dL (ref >39)
LDL Calculated: 72 mg/dL (ref 0–99)
TRIGLYCERIDES: 168 mg/dL — AB (ref 0–149)
VLDL Cholesterol Cal: 34 mg/dL (ref 5–40)

## 2018-03-20 LAB — T4, FREE: Free T4: 1.45 ng/dL (ref 0.82–1.77)

## 2018-03-20 LAB — TSH: TSH: 5.36 u[IU]/mL — ABNORMAL HIGH (ref 0.450–4.500)

## 2018-03-20 NOTE — Progress Notes (Signed)
Subjective:  Patient ID: Brianna Weber,  female    DOB: Nov 09, 1946  Age: 72 y.o.    CC: Diabetes (pt here today for routine follow up of her chronic medical conditions )   HPI Brianna Weber presents for  follow-up of hypertension. Patient has no history of headache chest pain or shortness of breath or recent cough. Patient also denies symptoms of TIA such as numbness weakness lateralizing. Patient denies side effects from medication. States taking it regularly.  Patient also  in for follow-up of elevated cholesterol. Doing well without complaints on current medication. Denies side effects  including myalgia and arthralgia and nausea. Also in today for liver function testing. Currently no chest pain, shortness of breath or other cardiovascular related symptoms noted.  Follow-up of diabetes. Patient does check blood sugar at home. Readings run between 60 and 70s fasting and under 168 postprandial Patient denies symptoms such as excessive hunger or urinary frequency, excessive hunger, nausea No significant hypoglycemic spells noted. Medications reviewed. Pt reports taking them regularly. Pt. denies complication/adverse reaction today.    Patient presents for follow-up on  thyroid. The patient has a history of hypothyroidism for many years. It has been stable recently. Pt. denies any change in  voice, loss of hair, heat or cold intolerance. Energy level has been adequate to good. Patient denies constipation and diarrhea. No myxedema. Medication is as noted below. Verified that pt is taking it daily on an empty stomach. Well tolerated.  History Brianna Weber has a past medical history of Allergy, Arrhythmia, Breast cancer ([place]), Breast cancer ([place]), Diabetes mellitus, Diverticulosis of colon, Hyperlipidemia, Hypertension, Myocardial infarct [facility]), NSTEMI (non-ST elevated myocardial infarction) ([place]), Peripheral edema, Thyroid disease, and Vitamin D deficiency.   She has a past surgical  history that includes Mastectomy; Cholecystectomy; and Abdominal hysterectomy.   Her family history includes Cancer in her maternal aunt; Diabetes in her daughter; Heart attack in her father; Heart disease in her brother, brother, maternal aunt, maternal grandmother, maternal uncle, mother, sister, sister, and sister; Hyperlipidemia in her maternal aunt and maternal uncle; Hypertension in her maternal aunt, maternal uncle, mother, and unknown relative.She reports that she has never smoked. She has never used smokeless tobacco. She reports that she does not drink alcohol or use drugs.  Current Outpatient Medications on File Prior to Visit  Medication Sig Dispense Refill  . aspirin 81 MG tablet Take 81 mg by mouth daily.      . fexofenadine (ALLEGRA) 180 MG tablet Take 180 mg by mouth daily.      Marland Kitchen glucose blood (ONE TOUCH ULTRA TEST) test strip Test bid. Dx E11.9 150 each 5  . insulin NPH-regular Human (NOVOLIN 70/30) (70-30) 100 UNIT/ML injection 55 u in AM and 58U each evening 10 mL 11  . vitamin D, CHOLECALCIFEROL, 400 UNITS tablet Take 400 Units by mouth daily.       No current facility-administered medications on file prior to visit.     ROS Review of Systems  Constitutional: Negative.   HENT: Negative for congestion.   Eyes: Negative for visual disturbance.  Respiratory: Negative for shortness of breath.   Cardiovascular: Negative for chest pain.  Gastrointestinal: Negative for abdominal pain, constipation, diarrhea, nausea and vomiting.  Genitourinary: Negative for difficulty urinating.  Musculoskeletal: Negative for arthralgias and myalgias.  Neurological: Negative for headaches.  Psychiatric/Behavioral: Negative for sleep disturbance.    Objective:  BP 125/66   Pulse 62   Temp 97.9 F (36.6 C) (Oral)  Ht '5\' 2"'$  (1.575 m)   Wt 156 lb 2 oz (70.8 kg)   BMI 28.56 kg/m   BP Readings from Last 3 Encounters:  03/19/18 125/66  12/17/17 140/74  03/27/17 135/78    Wt  Readings from Last 3 Encounters:  03/19/18 156 lb 2 oz (70.8 kg)  12/17/17 162 lb 6 oz (73.7 kg)  03/27/17 157 lb (71.2 kg)     Physical Exam  Constitutional: She is oriented to person, place, and time. She appears well-developed and well-nourished. No distress.  HENT:  Head: Normocephalic and atraumatic.  Right Ear: External ear normal.  Left Ear: External ear normal.  Nose: Nose normal.  Mouth/Throat: Oropharynx is clear and moist.  Eyes: Pupils are equal, round, and reactive to light. Conjunctivae and EOM are normal.  Neck: Normal range of motion. Neck supple. No thyromegaly present.  Cardiovascular: Normal rate, regular rhythm and normal heart sounds.  No murmur heard. Pulmonary/Chest: Effort normal and breath sounds normal. No respiratory distress. She has no wheezes. She has no rales.  Abdominal: Soft. Bowel sounds are normal. She exhibits no distension. There is no tenderness.  Lymphadenopathy:    She has no cervical adenopathy.  Neurological: She is alert and oriented to person, place, and time. She has normal reflexes.  Skin: Skin is warm and dry.  Psychiatric: She has a normal mood and affect. Her behavior is normal. Judgment and thought content normal.    Diabetic Foot Exam - Simple   No data filed        Assessment & Plan:   [place] was seen today for diabetes.  Diagnoses and all orders for this visit:  Type 2 diabetes mellitus without complication, with long-term current use of insulin (HCC) -     Bayer DCA Hb A1c Waived  Hypothyroidism, unspecified type -     TSH -     T4, Free  Hyperlipidemia with target LDL less than 100 -     CBC with Differential/Platelet -     CMP14+EGFR -     Lipid panel  Other orders -     amLODipine (NORVASC) 10 MG tablet; Take 1 tablet (10 mg total) by mouth daily. -     benazepril (LOTENSIN) 20 MG tablet; Take 1 tablet (20 mg total) by mouth daily. -     furosemide (LASIX) 40 MG tablet; Take 1 tablet (40 mg total) by  mouth daily. -     isosorbide mononitrate (IMDUR) 60 MG 24 hr tablet; Take 1 tablet (60 mg total) by mouth daily. -     levothyroxine (SYNTHROID, LEVOTHROID) 112 MCG tablet; Take 1 tablet (112 mcg total) by mouth daily. -     raloxifene (EVISTA) 60 MG tablet; Take 1 tablet (60 mg total) by mouth daily. -     simvastatin (ZOCOR) 20 MG tablet; Take 1 tablet (20 mg total) by mouth daily.   I have discontinued [name]. Stevison's sulfamethoxazole-trimethoprim. I am also having her maintain her aspirin, fexofenadine, vitamin D (CHOLECALCIFEROL), insulin NPH-regular Human, glucose blood, amLODipine, benazepril, furosemide, isosorbide mononitrate, levothyroxine, raloxifene, and simvastatin.  Meds ordered this encounter  Medications  . amLODipine (NORVASC) 10 MG tablet    Sig: Take 1 tablet (10 mg total) by mouth daily.    Dispense:  90 tablet    Refill:  1  . benazepril (LOTENSIN) 20 MG tablet    Sig: Take 1 tablet (20 mg total) by mouth daily.    Dispense:  90 tablet  Refill:  1  . furosemide (LASIX) 40 MG tablet    Sig: Take 1 tablet (40 mg total) by mouth daily.    Dispense:  90 tablet    Refill:  1  . isosorbide mononitrate (IMDUR) 60 MG 24 hr tablet    Sig: Take 1 tablet (60 mg total) by mouth daily.    Dispense:  90 tablet    Refill:  1  . levothyroxine (SYNTHROID, LEVOTHROID) 112 MCG tablet    Sig: Take 1 tablet (112 mcg total) by mouth daily.    Dispense:  90 tablet    Refill:  1    Please disregard prescription of 2/4 for different dose. Thanks,WS  . raloxifene (EVISTA) 60 MG tablet    Sig: Take 1 tablet (60 mg total) by mouth daily.    Dispense:  90 tablet    Refill:  1  . simvastatin (ZOCOR) 20 MG tablet    Sig: Take 1 tablet (20 mg total) by mouth daily.    Dispense:  90 tablet    Refill:  1     Follow-up: Return in about 3 months (around 06/19/2018).  [name], M.D.

## 2018-04-29 ENCOUNTER — Other Ambulatory Visit: Payer: Self-pay | Admitting: *Deleted

## 2018-04-29 MED ORDER — LEVOTHYROXINE SODIUM 112 MCG PO TABS: 112.0000 ug | ORAL_TABLET | Freq: Every day | ORAL | 0 refills | Status: DC

## 2018-04-29 MED ORDER — AMLODIPINE BESYLATE 10 MG PO TABS: 10.0000 mg | ORAL_TABLET | Freq: Every day | ORAL | 1 refills | Status: DC

## 2018-04-29 MED ORDER — BENAZEPRIL HCL 20 MG PO TABS: 20.0000 mg | ORAL_TABLET | Freq: Every day | ORAL | 1 refills | Status: DC

## 2018-04-29 MED ORDER — GLUCOSE BLOOD VI STRP
ORAL_STRIP | 3 refills | Status: DC
Start: 2018-04-29 — End: 2018-09-25

## 2018-04-29 MED ORDER — SIMVASTATIN 20 MG PO TABS: 20.0000 mg | ORAL_TABLET | Freq: Every day | ORAL | 1 refills | Status: DC

## 2018-04-29 MED ORDER — FUROSEMIDE 40 MG PO TABS: ORAL_TABLET | ORAL | 1 refills | Status: DC

## 2018-04-29 MED ORDER — ISOSORBIDE MONONITRATE ER 60 MG PO TB24: 60.0000 mg | ORAL_TABLET | Freq: Every day | ORAL | 1 refills | Status: DC

## 2018-05-17 ENCOUNTER — Telehealth: Payer: Self-pay | Admitting: Family Medicine

## 2018-05-17 NOTE — Telephone Encounter (Signed)
Done - [facility] is the only pharm on file now-jhb

## 2018-06-19 ENCOUNTER — Ambulatory Visit: Payer: Medicare Other | Admitting: Family Medicine

## 2018-07-01 ENCOUNTER — Other Ambulatory Visit: Payer: Self-pay

## 2018-07-01 ENCOUNTER — Ambulatory Visit: Payer: Medicare Other | Admitting: Family Medicine

## 2018-07-01 NOTE — Patient Outreach (Signed)
[facility]) Care Management  07/01/2018  SUGEY TREVATHAN 05-08-1946 [id number]   Medication Adherence call to Mrs. Gust Rung patient is due on Benazepril 20 mg and Simvastatin 20 mg spoke with patient she said she has enough until the end of the month then she will order it. Mr. Elenbaas is showing past due under [place].  [place] Management Direct Dial [phone]  Fax [phone] [email or web address]

## 2018-07-10 ENCOUNTER — Ambulatory Visit: Payer: Medicare Other | Admitting: Family Medicine

## 2018-07-17 ENCOUNTER — Ambulatory Visit (INDEPENDENT_AMBULATORY_CARE_PROVIDER_SITE_OTHER): Payer: Medicare Other | Admitting: Family Medicine

## 2018-07-17 ENCOUNTER — Encounter: Payer: Self-pay | Admitting: Family Medicine

## 2018-07-17 VITALS — BP 164/89 | HR 60 | Temp 97.9°F | Ht 62.0 in | Wt 148.0 lb

## 2018-07-17 DIAGNOSIS — E119 Type 2 diabetes mellitus without complications: Secondary | ICD-10-CM

## 2018-07-17 DIAGNOSIS — E039 Hypothyroidism, unspecified: Secondary | ICD-10-CM | POA: Diagnosis not present

## 2018-07-17 DIAGNOSIS — E785 Hyperlipidemia, unspecified: Secondary | ICD-10-CM | POA: Diagnosis not present

## 2018-07-17 DIAGNOSIS — Z794 Long term (current) use of insulin: Secondary | ICD-10-CM

## 2018-07-17 LAB — BAYER DCA HB A1C WAIVED: HB A1C (BAYER DCA - WAIVED): 6.5 % (ref <7.0)

## 2018-07-17 MED ORDER — SIMVASTATIN 20 MG PO TABS: 20.0000 mg | ORAL_TABLET | Freq: Every day | ORAL | 1 refills | Status: DC

## 2018-07-17 MED ORDER — BENAZEPRIL HCL 20 MG PO TABS: 20.0000 mg | ORAL_TABLET | Freq: Every day | ORAL | 1 refills | Status: DC

## 2018-07-17 MED ORDER — LEVOTHYROXINE SODIUM 112 MCG PO TABS: 112.0000 ug | ORAL_TABLET | Freq: Every day | ORAL | 1 refills | Status: DC

## 2018-07-17 MED ORDER — AMLODIPINE BESYLATE 10 MG PO TABS: 10.0000 mg | ORAL_TABLET | Freq: Every day | ORAL | 1 refills | Status: DC

## 2018-07-17 MED ORDER — ISOSORBIDE MONONITRATE ER 60 MG PO TB24: 60.0000 mg | ORAL_TABLET | Freq: Every day | ORAL | 1 refills | Status: DC

## 2018-07-17 MED ORDER — FUROSEMIDE 40 MG PO TABS: ORAL_TABLET | ORAL | 1 refills | Status: DC

## 2018-07-17 MED ORDER — RALOXIFENE HCL 60 MG PO TABS: 60.0000 mg | ORAL_TABLET | Freq: Every day | ORAL | 1 refills | Status: DC

## 2018-07-17 NOTE — Progress Notes (Signed)
Subjective:  Patient ID: Brianna Weber, female    DOB: 03/23/1946  Age: 72 y.o. MRN: [id number]  CC: Medical Management of Chronic Issues   HPI Brianna Weber presents forFollow-up of diabetes. Patient checks blood sugar at home.  107 fasting and 167 postprandial Patient denies symptoms such as polyuria, polydipsia, excessive hunger, nausea No significant hypoglycemic spells noted. Medications reviewed. Pt reports taking them regularly without complication/adverse reaction being reported today.  Checking feet daily. Last eye appt was greater than 1 year she is due and wants to set that up to be at the same time as her husband.  Patient presents for follow-up on  thyroid. The patient has a history of hypothyroidism for many years. It has been underactive recently. Pt. denies any change in  voice, loss of hair, heat or cold intolerance. Energy level has been adequate to good. Patient denies constipation and diarrhea. No myxedema. Medication is as noted below. Verified that pt is taking it daily on an empty stomach. Well tolerated.  History Brianna Weber has a past medical history of Allergy, Arrhythmia, Breast cancer ([place]), Breast cancer ([place]), Diabetes mellitus, Diverticulosis of colon, Hyperlipidemia, Hypertension, Myocardial infarct [facility]), NSTEMI (non-ST elevated myocardial infarction) ([place]), Peripheral edema, Thyroid disease, and Vitamin D deficiency.   She has a past surgical history that includes Mastectomy; Cholecystectomy; and Abdominal hysterectomy.   Her family history includes Cancer in her maternal aunt; Diabetes in her daughter; Heart attack in her father; Heart disease in her brother, brother, maternal aunt, maternal grandmother, maternal uncle, mother, sister, sister, and sister; Hyperlipidemia in her maternal aunt and maternal uncle; Hypertension in her maternal aunt, maternal uncle, mother, and unknown relative.She reports that she has never smoked. She has never used  smokeless tobacco. She reports that she does not drink alcohol or use drugs.  Current Outpatient Medications on File Prior to Visit  Medication Sig Dispense Refill  . aspirin 81 MG tablet Take 81 mg by mouth daily.      . fexofenadine (ALLEGRA) 180 MG tablet Take 180 mg by mouth daily.      Marland Kitchen glucose blood (ONE TOUCH ULTRA TEST) test strip Test bid. 200 each 3  . insulin NPH-regular Human (NOVOLIN 70/30) (70-30) 100 UNIT/ML injection 55 u in AM and 58U each evening 10 mL 11  . vitamin D, CHOLECALCIFEROL, 400 UNITS tablet Take 400 Units by mouth daily.       No current facility-administered medications on file prior to visit.     ROS Review of Systems  Constitutional: Negative.   HENT: Negative.   Eyes: Negative for visual disturbance.  Respiratory: Negative for shortness of breath.   Cardiovascular: Negative for chest pain.  Gastrointestinal: Negative for abdominal pain.  Musculoskeletal: Negative for arthralgias.    Objective:  BP (!) 164/89   Pulse 60   Temp 97.9 F (36.6 C) (Oral)   Ht '5\' 2"'$  (1.575 m)   Wt 148 lb (67.1 kg)   BMI 27.07 kg/m   BP Readings from Last 3 Encounters:  07/17/18 (!) 164/89  03/19/18 125/66  12/17/17 140/74    Wt Readings from Last 3 Encounters:  07/17/18 148 lb (67.1 kg)  03/19/18 156 lb 2 oz (70.8 kg)  12/17/17 162 lb 6 oz (73.7 kg)     Physical Exam  Constitutional: She is oriented to person, place, and time. She appears well-developed and well-nourished. No distress.  HENT:  Head: Normocephalic and atraumatic.  Right Ear: External ear normal.  Left Ear: External  ear normal.  Nose: Nose normal.  Mouth/Throat: Oropharynx is clear and moist.  Eyes: Pupils are equal, round, and reactive to light. Conjunctivae and EOM are normal.  Neck: Normal range of motion. Neck supple. No thyromegaly present.  Cardiovascular: Normal rate, regular rhythm and normal heart sounds.  No murmur heard. Pulmonary/Chest: Effort normal and breath sounds  normal. No respiratory distress. She has no wheezes. She has no rales.  Abdominal: Soft. Bowel sounds are normal. She exhibits no distension. There is no tenderness.  Lymphadenopathy:    She has no cervical adenopathy.  Neurological: She is alert and oriented to person, place, and time. She has normal reflexes.  Skin: Skin is warm and dry.  Psychiatric: She has a normal mood and affect. Her behavior is normal. Judgment and thought content normal.      Assessment & Plan:   Brianna Weber was seen today for medical management of chronic issues.  Diagnoses and all orders for this visit:  Type 2 diabetes mellitus without complication, with long-term current use of insulin (HCC) -     CBC with Differential/Platelet -     Bayer DCA Hb A1c Waived  Hypothyroidism, unspecified type -     Thyroid Panel With TSH -     T4, Free  Hyperlipidemia with target LDL less than 100 -     CMP14+EGFR  Other orders -     simvastatin (ZOCOR) 20 MG tablet; Take 1 tablet (20 mg total) by mouth daily. -     raloxifene (EVISTA) 60 MG tablet; Take 1 tablet (60 mg total) by mouth daily. -     levothyroxine (SYNTHROID, LEVOTHROID) 112 MCG tablet; Take 1 tablet (112 mcg total) by mouth daily. -     isosorbide mononitrate (IMDUR) 60 MG 24 hr tablet; Take 1 tablet (60 mg total) by mouth daily. -     furosemide (LASIX) 40 MG tablet; Take 1 tablet (40 mg total) by mouth daily. -     benazepril (LOTENSIN) 20 MG tablet; Take 1 tablet (20 mg total) by mouth daily. -     amLODipine (NORVASC) 10 MG tablet; Take 1 tablet (10 mg total) by mouth daily.      I am having Brianna Weber. Brianna Weber maintain her aspirin, fexofenadine, vitamin D (CHOLECALCIFEROL), insulin NPH-regular Human, glucose blood, simvastatin, raloxifene, levothyroxine, isosorbide mononitrate, furosemide, benazepril, and amLODipine.  Meds ordered this encounter  Medications  . simvastatin (ZOCOR) 20 MG tablet    Sig: Take 1 tablet (20 mg total) by mouth  daily.    Dispense:  90 tablet    Refill:  1  . raloxifene (EVISTA) 60 MG tablet    Sig: Take 1 tablet (60 mg total) by mouth daily.    Dispense:  90 tablet    Refill:  1  . levothyroxine (SYNTHROID, LEVOTHROID) 112 MCG tablet    Sig: Take 1 tablet (112 mcg total) by mouth daily.    Dispense:  90 tablet    Refill:  1  . isosorbide mononitrate (IMDUR) 60 MG 24 hr tablet    Sig: Take 1 tablet (60 mg total) by mouth daily.    Dispense:  90 tablet    Refill:  1  . furosemide (LASIX) 40 MG tablet    Sig: Take 1 tablet (40 mg total) by mouth daily.    Dispense:  90 tablet    Refill:  1  . benazepril (LOTENSIN) 20 MG tablet    Sig: Take 1 tablet (20 mg total) by mouth  daily.    Dispense:  90 tablet    Refill:  1  . amLODipine (NORVASC) 10 MG tablet    Sig: Take 1 tablet (10 mg total) by mouth daily.    Dispense:  90 tablet    Refill:  1     Follow-up: Return in about 3 months (around 10/16/2018).  [name], M.D.

## 2018-07-18 LAB — CBC WITH DIFFERENTIAL/PLATELET
BASOS: 0 %
Basophils Absolute: 0 10*3/uL (ref 0.0–0.2)
EOS (ABSOLUTE): 0.2 10*3/uL (ref 0.0–0.4)
EOS: 2 %
HEMATOCRIT: 37.7 % (ref 34.0–46.6)
HEMOGLOBIN: 12.6 g/dL (ref 11.1–15.9)
IMMATURE GRANS (ABS): 0.1 10*3/uL (ref 0.0–0.1)
Immature Granulocytes: 1 %
LYMPHS: 17 %
Lymphocytes Absolute: 1.9 10*3/uL (ref 0.7–3.1)
MCH: 27.6 pg (ref 26.6–33.0)
MCHC: 33.4 g/dL (ref 31.5–35.7)
MCV: 83 fL (ref 79–97)
MONOCYTES: 7 %
Monocytes Absolute: 0.8 10*3/uL (ref 0.1–0.9)
NEUTROS ABS: 7.8 10*3/uL — AB (ref 1.4–7.0)
Neutrophils: 73 %
Platelets: 399 10*3/uL (ref 150–450)
RBC: 4.57 x10E6/uL (ref 3.77–5.28)
RDW: 13.9 % (ref 12.3–15.4)
WBC: 10.6 10*3/uL (ref 3.4–10.8)

## 2018-07-18 LAB — THYROID PANEL WITH TSH
Free Thyroxine Index: 2.5 (ref 1.2–4.9)
T3 UPTAKE RATIO: 27 % (ref 24–39)
T4, Total: 9.4 ug/dL (ref 4.5–12.0)
TSH: 4.36 u[IU]/mL (ref 0.450–4.500)

## 2018-07-18 LAB — CMP14+EGFR
A/G RATIO: 1.6 (ref 1.2–2.2)
ALBUMIN: 4.5 g/dL (ref 3.5–4.8)
ALT: 15 IU/L (ref 0–32)
AST: 18 IU/L (ref 0–40)
Alkaline Phosphatase: 106 IU/L (ref 39–117)
BILIRUBIN TOTAL: 0.3 mg/dL (ref 0.0–1.2)
BUN / CREAT RATIO: 13 (ref 12–28)
BUN: 16 mg/dL (ref 8–27)
CALCIUM: 10 mg/dL (ref 8.7–10.3)
CHLORIDE: 103 mmol/L (ref 96–106)
CO2: 22 mmol/L (ref 20–29)
Creatinine, Ser: 1.25 mg/dL — ABNORMAL HIGH (ref 0.57–1.00)
GFR, EST AFRICAN AMERICAN: 50 mL/min/{1.73_m2} — AB (ref >59)
GFR, EST NON AFRICAN AMERICAN: 44 mL/min/{1.73_m2} — AB (ref >59)
Globulin, Total: 2.8 g/dL (ref 1.5–4.5)
Glucose: 38 mg/dL — CL (ref 65–99)
Potassium: 3.8 mmol/L (ref 3.5–5.2)
Sodium: 142 mmol/L (ref 134–144)
TOTAL PROTEIN: 7.3 g/dL (ref 6.0–8.5)

## 2018-07-18 LAB — T4, FREE: Free T4: 1.31 ng/dL (ref 0.82–1.77)

## 2018-07-22 ENCOUNTER — Telehealth: Payer: Self-pay | Admitting: Family Medicine

## 2018-07-23 ENCOUNTER — Telehealth: Payer: Self-pay | Admitting: Family Medicine

## 2018-07-23 NOTE — Telephone Encounter (Signed)
Spoke with patient, she has decided it is okay that we sent in a prescription for her Raloxifene, she will accept it.

## 2018-07-23 NOTE — Telephone Encounter (Signed)
Patient already message put in

## 2018-07-23 NOTE — Telephone Encounter (Signed)
Patient does not think she is supposed to be taking Raloxifene anymore and wants [place] to cancel the prescription that was sent to mail order for her.  Please advise.

## 2018-08-26 ENCOUNTER — Observation Stay (HOSPITAL_COMMUNITY)
Admission: EM | Admit: 2018-08-26 | Discharge: 2018-08-27 | Disposition: A | Discharge status: Home / Self Care | Payer: Medicare Other | Attending: Internal Medicine | Admitting: Internal Medicine

## 2018-08-26 ENCOUNTER — Other Ambulatory Visit: Payer: Self-pay

## 2018-08-26 ENCOUNTER — Encounter (HOSPITAL_COMMUNITY): Payer: Self-pay

## 2018-08-26 DIAGNOSIS — E039 Hypothyroidism, unspecified: Secondary | ICD-10-CM | POA: Diagnosis present

## 2018-08-26 DIAGNOSIS — G9341 Metabolic encephalopathy: Principal | ICD-10-CM | POA: Insufficient documentation

## 2018-08-26 DIAGNOSIS — Z794 Long term (current) use of insulin: Secondary | ICD-10-CM | POA: Diagnosis not present

## 2018-08-26 DIAGNOSIS — Z79899 Other long term (current) drug therapy: Secondary | ICD-10-CM | POA: Diagnosis not present

## 2018-08-26 DIAGNOSIS — N390 Urinary tract infection, site not specified: Secondary | ICD-10-CM

## 2018-08-26 DIAGNOSIS — R410 Disorientation, unspecified: Secondary | ICD-10-CM | POA: Diagnosis not present

## 2018-08-26 DIAGNOSIS — R001 Bradycardia, unspecified: Secondary | ICD-10-CM | POA: Diagnosis not present

## 2018-08-26 DIAGNOSIS — E119 Type 2 diabetes mellitus without complications: Secondary | ICD-10-CM

## 2018-08-26 DIAGNOSIS — Z853 Personal history of malignant neoplasm of breast: Secondary | ICD-10-CM | POA: Diagnosis not present

## 2018-08-26 DIAGNOSIS — E11649 Type 2 diabetes mellitus with hypoglycemia without coma: Secondary | ICD-10-CM | POA: Insufficient documentation

## 2018-08-26 DIAGNOSIS — E162 Hypoglycemia, unspecified: Secondary | ICD-10-CM | POA: Diagnosis not present

## 2018-08-26 DIAGNOSIS — F32A Depression, unspecified: Secondary | ICD-10-CM

## 2018-08-26 DIAGNOSIS — E785 Hyperlipidemia, unspecified: Secondary | ICD-10-CM | POA: Diagnosis not present

## 2018-08-26 DIAGNOSIS — C50919 Malignant neoplasm of unspecified site of unspecified female breast: Secondary | ICD-10-CM | POA: Diagnosis present

## 2018-08-26 DIAGNOSIS — I2510 Atherosclerotic heart disease of native coronary artery without angina pectoris: Secondary | ICD-10-CM | POA: Insufficient documentation

## 2018-08-26 DIAGNOSIS — I959 Hypotension, unspecified: Secondary | ICD-10-CM | POA: Diagnosis not present

## 2018-08-26 DIAGNOSIS — R413 Other amnesia: Secondary | ICD-10-CM

## 2018-08-26 DIAGNOSIS — F329 Major depressive disorder, single episode, unspecified: Secondary | ICD-10-CM

## 2018-08-26 DIAGNOSIS — I10 Essential (primary) hypertension: Secondary | ICD-10-CM | POA: Diagnosis not present

## 2018-08-26 NOTE — ED Triage Notes (Signed)
Daughter called for a wellness check. When PD arrived pt was unresponsive. Pt fsbs was 51. Given D10 brought it up to 322. Pt BP/HR was low.  EMS placed 20g L wrist.

## 2018-08-27 ENCOUNTER — Encounter (HOSPITAL_COMMUNITY): Payer: Self-pay | Admitting: Internal Medicine

## 2018-08-27 ENCOUNTER — Emergency Department (HOSPITAL_COMMUNITY): Payer: Medicare Other

## 2018-08-27 DIAGNOSIS — E162 Hypoglycemia, unspecified: Secondary | ICD-10-CM | POA: Diagnosis not present

## 2018-08-27 DIAGNOSIS — F32A Depression, unspecified: Secondary | ICD-10-CM

## 2018-08-27 DIAGNOSIS — R410 Disorientation, unspecified: Secondary | ICD-10-CM | POA: Diagnosis not present

## 2018-08-27 DIAGNOSIS — I10 Essential (primary) hypertension: Secondary | ICD-10-CM

## 2018-08-27 DIAGNOSIS — C50919 Malignant neoplasm of unspecified site of unspecified female breast: Secondary | ICD-10-CM | POA: Diagnosis present

## 2018-08-27 DIAGNOSIS — E782 Mixed hyperlipidemia: Secondary | ICD-10-CM

## 2018-08-27 DIAGNOSIS — E785 Hyperlipidemia, unspecified: Secondary | ICD-10-CM | POA: Diagnosis present

## 2018-08-27 DIAGNOSIS — F329 Major depressive disorder, single episode, unspecified: Secondary | ICD-10-CM

## 2018-08-27 LAB — COMPREHENSIVE METABOLIC PANEL
ALBUMIN: 3.8 g/dL (ref 3.5–5.0)
ALT: 14 U/L (ref 0–44)
AST: 21 U/L (ref 15–41)
Alkaline Phosphatase: 87 U/L (ref 38–126)
Anion gap: 6 (ref 5–15)
BUN: 15 mg/dL (ref 8–23)
CHLORIDE: 107 mmol/L (ref 98–111)
CO2: 25 mmol/L (ref 22–32)
Calcium: 9.3 mg/dL (ref 8.9–10.3)
Creatinine, Ser: 1.34 mg/dL — ABNORMAL HIGH (ref 0.44–1.00)
GFR calc Af Amer: 45 mL/min — ABNORMAL LOW (ref >60)
GFR, EST NON AFRICAN AMERICAN: 39 mL/min — AB (ref >60)
GLUCOSE: 163 mg/dL — AB (ref 70–99)
POTASSIUM: 4 mmol/L (ref 3.5–5.1)
SODIUM: 138 mmol/L (ref 135–145)
Total Bilirubin: 0.7 mg/dL (ref 0.3–1.2)
Total Protein: 7 g/dL (ref 6.5–8.1)

## 2018-08-27 LAB — URINALYSIS, ROUTINE W REFLEX MICROSCOPIC
Bilirubin Urine: NEGATIVE
Glucose, UA: 50 mg/dL — AB
HGB URINE DIPSTICK: NEGATIVE
Ketones, ur: NEGATIVE mg/dL
NITRITE: POSITIVE — AB
PROTEIN: NEGATIVE mg/dL
Specific Gravity, Urine: 1.005 (ref 1.005–1.030)
pH: 5 (ref 5.0–8.0)

## 2018-08-27 LAB — CBC WITH DIFFERENTIAL/PLATELET
ABS IMMATURE GRANULOCYTES: 0.07 10*3/uL (ref 0.00–0.07)
Basophils Absolute: 0 10*3/uL (ref 0.0–0.1)
Basophils Relative: 0 %
Eosinophils Absolute: 0 10*3/uL (ref 0.0–0.5)
Eosinophils Relative: 0 %
HCT: 37.3 % (ref 36.0–46.0)
HEMOGLOBIN: 12.1 g/dL (ref 12.0–15.0)
Immature Granulocytes: 1 %
LYMPHS ABS: 1 10*3/uL (ref 0.7–4.0)
LYMPHS PCT: 9 %
MCH: 27.8 pg (ref 26.0–34.0)
MCHC: 32.4 g/dL (ref 30.0–36.0)
MCV: 85.6 fL (ref 80.0–100.0)
Monocytes Absolute: 0.5 10*3/uL (ref 0.1–1.0)
Monocytes Relative: 4 %
NEUTROS ABS: 10.3 10*3/uL — AB (ref 1.7–7.7)
Neutrophils Relative %: 86 %
Platelets: 328 10*3/uL (ref 150–400)
RBC: 4.36 MIL/uL (ref 3.87–5.11)
RDW: 12.9 % (ref 11.5–15.5)
WBC: 11.9 10*3/uL — ABNORMAL HIGH (ref 4.0–10.5)
nRBC: 0 % (ref 0.0–0.2)

## 2018-08-27 LAB — FOLATE: Folate: 16.6 ng/mL (ref >5.9)

## 2018-08-27 LAB — GLUCOSE, CAPILLARY
GLUCOSE-CAPILLARY: 127 mg/dL — AB (ref 70–99)
GLUCOSE-CAPILLARY: 156 mg/dL — AB (ref 70–99)
GLUCOSE-CAPILLARY: 127 mg/dL — AB (ref 70–99)
Glucose-Capillary: 121 mg/dL — ABNORMAL HIGH (ref 70–99)
Glucose-Capillary: 158 mg/dL — ABNORMAL HIGH (ref 70–99)

## 2018-08-27 LAB — HEMOGLOBIN A1C
HEMOGLOBIN A1C: 6.8 % — AB (ref 4.8–5.6)
Mean Plasma Glucose: 148.46 mg/dL

## 2018-08-27 LAB — CBG MONITORING, ED
GLUCOSE-CAPILLARY: 147 mg/dL — AB (ref 70–99)
GLUCOSE-CAPILLARY: 159 mg/dL — AB (ref 70–99)
GLUCOSE-CAPILLARY: 210 mg/dL — AB (ref 70–99)

## 2018-08-27 LAB — VITAMIN B12: VITAMIN B 12: 187 pg/mL (ref 180–914)

## 2018-08-27 LAB — TROPONIN I

## 2018-08-27 LAB — TSH: TSH: 4.787 u[IU]/mL — AB (ref 0.350–4.500)

## 2018-08-27 MED ORDER — NITROFURANTOIN MONOHYD MACRO 100 MG PO CAPS
100.0000 mg | ORAL_CAPSULE | Freq: Two times a day (BID) | ORAL | Status: DC
  Administered 2018-08-27: 100 mg via ORAL
  Filled 2018-08-27: qty 1

## 2018-08-27 MED ORDER — ONDANSETRON HCL 4 MG PO TABS: 4.0000 mg | ORAL_TABLET | Freq: Four times a day (QID) | ORAL | Status: DC | PRN

## 2018-08-27 MED ORDER — INFLUENZA VAC SPLIT HIGH-DOSE 0.5 ML IM SUSY: 0.5000 mL | PREFILLED_SYRINGE | INTRAMUSCULAR | Status: DC

## 2018-08-27 MED ORDER — CYANOCOBALAMIN 500 MCG PO TABS: 500.0000 ug | ORAL_TABLET | Freq: Every day | ORAL | 0 refills | Status: DC

## 2018-08-27 MED ORDER — ISOSORBIDE MONONITRATE ER 60 MG PO TB24
60.0000 mg | ORAL_TABLET | Freq: Every day | ORAL | Status: DC
  Administered 2018-08-27: 60 mg via ORAL
  Filled 2018-08-27: qty 1

## 2018-08-27 MED ORDER — ONDANSETRON HCL 4 MG/2ML IJ SOLN: 4.0000 mg | Freq: Four times a day (QID) | INTRAMUSCULAR | Status: DC | PRN

## 2018-08-27 MED ORDER — CHOLECALCIFEROL 10 MCG (400 UNIT) PO TABS
400.0000 [IU] | ORAL_TABLET | Freq: Every day | ORAL | Status: DC
  Administered 2018-08-27: 400 [IU] via ORAL
  Filled 2018-08-27 (×3): qty 1

## 2018-08-27 MED ORDER — SIMVASTATIN 20 MG PO TABS
20.0000 mg | ORAL_TABLET | Freq: Every day | ORAL | Status: DC
  Administered 2018-08-27: 20 mg via ORAL
  Filled 2018-08-27: qty 1

## 2018-08-27 MED ORDER — ASPIRIN EC 81 MG PO TBEC
81.0000 mg | DELAYED_RELEASE_TABLET | Freq: Every day | ORAL | Status: DC
  Administered 2018-08-27: 81 mg via ORAL
  Filled 2018-08-27: qty 1

## 2018-08-27 MED ORDER — ACETAMINOPHEN 325 MG PO TABS: 650.0000 mg | ORAL_TABLET | Freq: Four times a day (QID) | ORAL | Status: DC | PRN

## 2018-08-27 MED ORDER — CEPHALEXIN 500 MG PO CAPS
500.0000 mg | ORAL_CAPSULE | Freq: Once | ORAL | Status: AC
  Administered 2018-08-27: 500 mg via ORAL
  Filled 2018-08-27: qty 1

## 2018-08-27 MED ORDER — HEPARIN SODIUM (PORCINE) 5000 UNIT/ML IJ SOLN
5000.0000 [IU] | Freq: Three times a day (TID) | INTRAMUSCULAR | Status: DC
  Administered 2018-08-27: 5000 [IU] via SUBCUTANEOUS
  Filled 2018-08-27: qty 1

## 2018-08-27 MED ORDER — CYANOCOBALAMIN 1000 MCG/ML IJ SOLN: 1000.0000 ug | Freq: Every day | INTRAMUSCULAR | Status: DC

## 2018-08-27 MED ORDER — ACETAMINOPHEN 650 MG RE SUPP: 650.0000 mg | Freq: Four times a day (QID) | RECTAL | Status: DC | PRN

## 2018-08-27 MED ORDER — LEVOTHYROXINE SODIUM 125 MCG PO TABS: 125.0000 ug | ORAL_TABLET | Freq: Every day | ORAL | 0 refills | Status: DC

## 2018-08-27 MED ORDER — POTASSIUM CHLORIDE IN NACL 20-0.9 MEQ/L-% IV SOLN
INTRAVENOUS | Status: DC
  Administered 2018-08-27: 75 mL/h via INTRAVENOUS

## 2018-08-27 MED ORDER — BENAZEPRIL HCL 10 MG PO TABS: 20.0000 mg | ORAL_TABLET | Freq: Every day | ORAL | Status: DC

## 2018-08-27 MED ORDER — LORATADINE 10 MG PO TABS
10.0000 mg | ORAL_TABLET | Freq: Every day | ORAL | Status: DC
  Administered 2018-08-27: 10 mg via ORAL
  Filled 2018-08-27: qty 1

## 2018-08-27 MED ORDER — CYANOCOBALAMIN 1000 MCG/ML IJ SOLN
1000.0000 ug | Freq: Every day | INTRAMUSCULAR | Status: AC
  Administered 2018-08-27: 1000 ug via INTRAMUSCULAR
  Filled 2018-08-27: qty 1

## 2018-08-27 MED ORDER — INSULIN NPH ISOPHANE & REGULAR (70-30) 100 UNIT/ML ~~LOC~~ SUSP: SUBCUTANEOUS | 0 refills | Status: DC

## 2018-08-27 MED ORDER — DEXTROSE 5 % IV SOLN
INTRAVENOUS | Status: DC
  Administered 2018-08-27: 07:00:00 via INTRAVENOUS

## 2018-08-27 MED ORDER — AMLODIPINE BESYLATE 5 MG PO TABS: 10.0000 mg | ORAL_TABLET | Freq: Every day | ORAL | Status: DC

## 2018-08-27 MED ORDER — LEVOTHYROXINE SODIUM 112 MCG PO TABS
112.0000 ug | ORAL_TABLET | Freq: Every day | ORAL | Status: DC
  Administered 2018-08-27: 112 ug via ORAL
  Filled 2018-08-27: qty 1

## 2018-08-27 MED ORDER — RALOXIFENE HCL 60 MG PO TABS
60.0000 mg | ORAL_TABLET | Freq: Every day | ORAL | Status: DC
  Administered 2018-08-27: 60 mg via ORAL
  Filled 2018-08-27: qty 1

## 2018-08-27 NOTE — Care Management Note (Signed)
Case Management Note  Patient Details  Name: Brianna Weber MRN: [id number] Date of Birth: 04-02-1946  Subjective/Objective:                 Admitted with hypoglycemia after mismanaging medications. Pt lives alone, has strong family support. Husband passed last month. Pt ind with ADL's. Need HH nursing for med management. Pt has no preference of provider. Okay with [facility].    Action/Plan: DC home with [place] services through [facility]. Tim, [facility] rep, given referral. Pt aware they have 48 hrs to make first visit.   Expected Discharge Date:  08/29/18               Expected Discharge Plan:  [place]  In-House Referral:  NA  Discharge planning Services  CM Consult  Post Acute Care Choice:  Home Health Choice offered to:  Patient  HH Arranged:  RN [place] Agency:  [facility]  Status of Service:  Completed, signed off  Sherald Barge, RN 08/27/2018, 3:07 PM

## 2018-08-27 NOTE — ED Notes (Signed)
Pt finished her PBJ sandwich . Sweet tea at bedside`

## 2018-08-27 NOTE — Progress Notes (Signed)
BS 121

## 2018-08-27 NOTE — Evaluation (Signed)
Physical Therapy Evaluation Patient Details Name: Brianna Weber MRN: [id number] DOB: [birth year] Today's Date: 08/27/2018   History of Present Illness  72 yo female with onset of low BS was admitted from home where she has been completely independent including yard work and community errands.  Has lost her husband in the last month.  Came to hosp for severe hypoglycemia, resolved with glucose and food.  Has home care nurses from ins to monitor her vitals and BS.   PMHx:  bradycardia, breast CA, diverticulosis, HTN, CAD, NSTEMI, peripheral edema, DM  Clinical Impression  Pt was seen for evaluation of gait and to assess safety to go home alone.  Her recent experience was to have a severe hypoglycemic event, and is concerned about her pets and home requiring her attention while she is in [place]. Talked with her about being sure to care for herself as she recently lost husband.  Pt is fine to return home with no follow up PT and has home care nursing to assess her from the insurance standpoint that will offer some oversight on her health.  See acutely for mobility if she is to remain overnight.    Follow Up Recommendations No PT follow up    Equipment Recommendations  None recommended by PT    Recommendations for Other Services       Precautions / Restrictions Precautions Precautions: Fall(telemetry) Restrictions Weight Bearing Restrictions: No      Mobility  Bed Mobility Overal bed mobility: Needs Assistance Bed Mobility: Supine to Sit     Supine to sit: Supervision(for safety)        Transfers Overall transfer level: Needs assistance Equipment used: None Transfers: Sit to/from Stand Sit to Stand: Supervision(for safety)         General transfer comment: able to assist her own standing from bed with good hand placement  Ambulation/Gait Ambulation/Gait assistance: Min guard Gait Distance (Feet): 250 Feet Assistive device: None Gait Pattern/deviations: Step-through  pattern;Wide base of support Gait velocity: reduced Gait velocity interpretation: <1.31 ft/sec, indicative of household ambulator General Gait Details: pt was able to navigate obstacles and turns with no assist  Stairs            Wheelchair Mobility    Modified Rankin (Stroke Patients Only)       Balance Overall balance assessment: No apparent balance deficits (not formally assessed)                                           Pertinent Vitals/Pain Pain Assessment: No/denies pain    Home Living Family/patient expects to be discharged to:: Private residence Living Arrangements: Alone Available Help at Discharge: Family;Friend(s);Available PRN/intermittently Type of Home: House Home Access: Stairs to enter Entrance Stairs-Rails: Chemical engineer of Steps: 5 Home Layout: One level Home Equipment: Walker - 2 wheels;Cane - single point;Wheelchair - manual(tripod walker) Additional Comments: Pt has a great deal of equipment due to caring for her sick husband    Prior Function Level of Independence: Independent         Comments: caregiver for husband     Hand Dominance   Dominant Hand: Right    Extremity/Trunk Assessment   Upper Extremity Assessment Upper Extremity Assessment: Overall WFL for tasks assessed    Lower Extremity Assessment Lower Extremity Assessment: Overall WFL for tasks assessed    Cervical / Trunk Assessment Cervical / Trunk  Assessment: Normal  Communication   Communication: No difficulties  Cognition Arousal/Alertness: Awake/alert Behavior During Therapy: WFL for tasks assessed/performed Overall Cognitive Status: Within Functional Limits for tasks assessed                                        General Comments      Exercises     Assessment/Plan    PT Assessment Patient needs continued PT services  PT Problem List Decreased safety awareness;Cardiopulmonary status limiting  activity       PT Treatment Interventions DME instruction;Gait training;Stair training;Functional mobility training;Therapeutic activities;Therapeutic exercise;Balance training;Neuromuscular re-education;Patient/family education    PT Goals (Current goals can be found in the Care Plan section)  Acute Rehab PT Goals Patient Stated Goal: to go home today PT Goal Formulation: With patient Time For Goal Achievement: 09/03/18 Potential to Achieve Goals: Good    Frequency Min 3X/week   Barriers to discharge Inaccessible home environment;Decreased caregiver support home alone with stairs to enter house    Co-evaluation               AM-PAC PT "6 Clicks" Daily Activity  Outcome Measure Difficulty turning over in bed (including adjusting bedclothes, sheets and blankets)?: None Difficulty moving from lying on back to sitting on the side of the bed? : None Difficulty sitting down on and standing up from a chair with arms (e.g., wheelchair, bedside commode, etc,.)?: None Help needed moving to and from a bed to chair (including a wheelchair)?: None Help needed walking in hospital room?: A Little Help needed climbing 3-5 steps with a railing? : A Little 6 Click Score: 22    End of Session Equipment Utilized During Treatment: Gait belt Activity Tolerance: Patient tolerated treatment well Patient left: in bed;with call bell/phone within reach;with bed alarm set(per nsg request) Nurse Communication: Mobility status PT Visit Diagnosis: Difficulty in walking, not elsewhere classified (R26.2)    Time: 1203-1220 PT Time Calculation (min) (ACUTE ONLY): 17 min   Charges:   PT Evaluation $PT Eval Low Complexity: 1 Low         Ramond Dial 08/27/2018, 1:47 PM   Mee Hives, PT MS Acute Rehab Dept. Number: [place] and [place]

## 2018-08-27 NOTE — ED Provider Notes (Signed)
[facility] EMERGENCY DEPARTMENT Provider Note   CSN: [id number] Arrival date & time: 08/26/18  2349  Time seen 12:15 AM   History   Chief Complaint Chief Complaint  Patient presents with  . Hypoglycemia    HPI Brianna Weber is a 72 y.o. female.  HPI patient states she felt fine all day today.  She states her sister came and visited and they walked around looking at her flowers.  She states she thinks she ate today like usual but she cannot remember what she ate and when.  She is not sure if she took her insulin this evening.  She told me she takes 55 units in the morning and then 45 units in the evening but then she tells me she only takes half that amount.  She states she actually takes 25 units in the morning and 15-20 in the evening depending on what her CBGs show.  When I asked her what her typical blood sugar she cannot tell me.  She  just states "they are really good".  She denies feeling sweaty or shaky.  She states she was sitting in the recliner in her living room when she woke up in all of her family was there with EMS.  She states she is only had one episode before long time ago.  She states she was widowed earlier this year.  She does not know when her husband died.  She states she feels like she is depressed and she is eating less.  She denies suicidal ideation.  She is not sure she is on any pills for her diabetes.  PCP [name], MD   Past Medical History:  Diagnosis Date  . Allergy   . Arrhythmia    bradycardia  . Breast cancer ([place])   . Breast cancer [facility])    s/p left mastectomy  . Diabetes mellitus   . Diverticulosis of colon   . Hyperlipidemia   . Hypertension   . Myocardial infarct ([place])   . NSTEMI (non-ST elevated myocardial infarction) ([place])    2004 secondary to stress induced cardiomyopathy. Normal LV function by repeatr echo.  . Peripheral edema   . Thyroid disease    hypothyroidism  . Vitamin D deficiency     Patient Active Problem  List   Diagnosis Date Noted  . Hypoglycemia 08/27/2018  . Vitamin D deficiency 01/06/2016  . Peripheral edema 09/16/2014  . Diabetes ([place]) 03/17/2013  . 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 Hypothyroidism 05/04/2010  . Hyperlipidemia with target LDL less than 100 05/04/2010  . Essential hypertension 05/04/2010  . BRADYCARDIA 05/04/2010    Past Surgical History:  Procedure Laterality Date  . ABDOMINAL HYSTERECTOMY    . CHOLECYSTECTOMY    . MASTECTOMY     left     OB History    Gravida  4   Para  4   Term      Preterm      AB      Living  4     SAB      TAB      Ectopic      Multiple      Live Births               Home Medications    Prior to Admission medications   Medication Sig Start Date End Date Taking? Authorizing Provider  amLODipine (NORVASC) 10 MG tablet Take 1 tablet (10 mg total) by mouth daily. 07/17/18   [name], MD  aspirin 81 MG tablet Take 81 mg by mouth daily.      [provider]  benazepril (LOTENSIN) 20 MG tablet Take 1 tablet (20 mg total) by mouth daily. 07/17/18   [name], MD  fexofenadine (ALLEGRA) 180 MG tablet Take 180 mg by mouth daily.      [provider]  furosemide (LASIX) 40 MG tablet Take 1 tablet (40 mg total) by mouth daily. 07/17/18   [name], MD  glucose blood (ONE TOUCH ULTRA TEST) test strip Test bid. 04/29/18   [name], MD  insulin NPH-regular Human (NOVOLIN 70/30) (70-30) 100 UNIT/ML injection 55 u in AM and 58U each evening 10/01/15   Cherre Robins, PharmD  isosorbide mononitrate (IMDUR) 60 MG 24 hr tablet Take 1 tablet (60 mg total) by mouth daily. 07/17/18   [name], MD  levothyroxine (SYNTHROID, LEVOTHROID) 112 MCG tablet Take 1 tablet (112 mcg total) by mouth daily. 07/17/18   [name], MD  raloxifene (EVISTA) 60 MG tablet Take 1 tablet (60 mg total) by mouth daily. 07/17/18   [name], MD  simvastatin (ZOCOR) 20 MG tablet Take 1 tablet (20 mg total) by  mouth daily. 07/17/18   [name], MD  vitamin D, CHOLECALCIFEROL, 400 UNITS tablet Take 400 Units by mouth daily.      [provider]    Family History Family History  Problem Relation Age of Onset  . Hypertension Mother   . Heart disease Mother   . Heart attack Father   . Heart disease Sister   . Heart disease Brother   . Cancer Maternal Aunt        breast   . Heart disease Maternal Aunt   . Hyperlipidemia Maternal Aunt   . Hypertension Maternal Aunt   . Heart disease Maternal Uncle   . Hyperlipidemia Maternal Uncle   . Hypertension Maternal Uncle   . Heart disease Maternal Grandmother   . Heart disease Sister   . Heart disease Sister   . Heart disease Brother   . Diabetes Daughter   . Hypertension Unknown     Social History Social History   Tobacco Use  . Smoking status: Never Smoker  . Smokeless tobacco: Never Used  Substance Use Topics  . Alcohol use: No  . Drug use: No  lives at home Lives alone   Allergies   Patient has no known allergies.   Review of Systems Review of Systems  All other systems reviewed and are negative.    Physical Exam Updated Vital Signs BP (!) 106/57   Pulse (!) 54   Resp 15   Ht 5\' 2"  (1.575 m)   Wt 67 kg   SpO2 99%   BMI 27.02 kg/m   Vital signs normal except for bradycardia   Physical Exam  Constitutional: She is oriented to person, place, and time. She appears well-developed and well-nourished.  Non-toxic appearance. She does not appear ill. No distress.  HENT:  Head: Normocephalic and atraumatic.  Right Ear: External ear normal.  Left Ear: External ear normal.  Nose: Nose normal. No mucosal edema or rhinorrhea.  Mouth/Throat: Oropharynx is clear and moist and mucous membranes are normal. No dental abscesses or uvula swelling.  Eyes: Pupils are equal, round, and reactive to light. Conjunctivae and EOM are normal.  Neck: Normal range of motion and full passive range of motion without pain. Neck  supple.  Cardiovascular: Normal rate, regular rhythm and normal heart sounds. Exam reveals no gallop and  no friction rub.  No murmur heard. Pulmonary/Chest: Effort normal and breath sounds normal. No respiratory distress. She has no wheezes. She has no rhonchi. She has no rales. She exhibits no tenderness and no crepitus.  Abdominal: Soft. Normal appearance and bowel sounds are normal. She exhibits no distension. There is no tenderness. There is no rebound and no guarding.  Musculoskeletal: Normal range of motion. She exhibits no edema or tenderness.  Moves all extremities well.   Neurological: She is alert and oriented to person, place, and time. She has normal strength. No cranial nerve deficit.  While talking to the patient it becomes apparent that she is having a lot of memory problems.  She cannot recall if she ate today, she cannot recall she took her medicine today,, she does not know what her blood sugars normally run.  She states "my brain is not working right".  Skin: Skin is warm, dry and intact. No rash noted. No erythema. No pallor.  Psychiatric: She has a normal mood and affect. Her speech is normal and behavior is normal. Her mood appears not anxious.  Nursing note and vitals reviewed.    ED Treatments / Results  Labs (all labs ordered are listed, but only abnormal results are displayed) Results for orders placed or performed during the hospital encounter of 08/26/18  Comprehensive metabolic panel  Result Value Ref Range   Sodium 138 135 - 145 mmol/L   Potassium 4.0 3.5 - 5.1 mmol/L   Chloride 107 98 - 111 mmol/L   CO2 25 22 - 32 mmol/L   Glucose, Bld 163 (H) 70 - 99 mg/dL   BUN 15 8 - 23 mg/dL   Creatinine, Ser 1.34 (H) 0.44 - 1.00 mg/dL   Calcium 9.3 8.9 - 10.3 mg/dL   Total Protein 7.0 6.5 - 8.1 g/dL   Albumin 3.8 3.5 - 5.0 g/dL   AST 21 15 - 41 U/L   ALT 14 0 - 44 U/L   Alkaline Phosphatase 87 38 - 126 U/L   Total Bilirubin 0.7 0.3 - 1.2 mg/dL   GFR calc non Af  Amer 39 (L) >60 mL/min   GFR calc Af Amer 45 (L) >60 mL/min   Anion gap 6 5 - 15  CBC with Differential  Result Value Ref Range   WBC 11.9 (H) 4.0 - 10.5 K/uL   RBC 4.36 3.87 - 5.11 MIL/uL   Hemoglobin 12.1 12.0 - 15.0 g/dL   HCT 37.3 36.0 - 46.0 %   MCV 85.6 80.0 - 100.0 fL   MCH 27.8 26.0 - 34.0 pg   MCHC 32.4 30.0 - 36.0 g/dL   RDW 12.9 11.5 - 15.5 %   Platelets 328 150 - 400 K/uL   nRBC 0.0 0.0 - 0.2 %   Neutrophils Relative % 86 %   Neutro Abs 10.3 (H) 1.7 - 7.7 K/uL   Lymphocytes Relative 9 %   Lymphs Abs 1.0 0.7 - 4.0 K/uL   Monocytes Relative 4 %   Monocytes Absolute 0.5 0.1 - 1.0 K/uL   Eosinophils Relative 0 %   Eosinophils Absolute 0.0 0.0 - 0.5 K/uL   Basophils Relative 0 %   Basophils Absolute 0.0 0.0 - 0.1 K/uL   Immature Granulocytes 1 %   Abs Immature Granulocytes 0.07 0.00 - 0.07 K/uL  Urinalysis, Routine w reflex microscopic  Result Value Ref Range   Color, Urine YELLOW YELLOW   APPearance CLEAR CLEAR   Specific Gravity, Urine 1.005 1.005 - 1.030  pH 5.0 5.0 - 8.0   Glucose, UA 50 (A) NEGATIVE mg/dL   Hgb urine dipstick NEGATIVE NEGATIVE   Bilirubin Urine NEGATIVE NEGATIVE   Ketones, ur NEGATIVE NEGATIVE mg/dL   Protein, ur NEGATIVE NEGATIVE mg/dL   Nitrite POSITIVE (A) NEGATIVE   Leukocytes, UA SMALL (A) NEGATIVE   RBC / HPF 0-5 0 - 5 RBC/hpf   WBC, UA 0-5 0 - 5 WBC/hpf   Bacteria, UA RARE (A) NONE SEEN   Squamous Epithelial / LPF 0-5 0 - 5   Hyaline Casts, UA PRESENT   Troponin I  Result Value Ref Range   Troponin I <0.03 <0.03 ng/mL  CBG monitoring, ED  Result Value Ref Range   Glucose-Capillary 210 (H) 70 - 99 mg/dL  CBG monitoring, ED  Result Value Ref Range   Glucose-Capillary 159 (H) 70 - 99 mg/dL   Laboratory interpretation all normal except probable UTI, urine culture sent    EKG EKG Interpretation  Date/Time:  Tuesday August 27 2018 00:42:52 EDT Ventricular Rate:  58 PR Interval:    QRS Duration: 123 QT  Interval:  476 QTC Calculation: 468 R Axis:   -33 Text Interpretation:  Sinus or ectopic atrial rhythm Short PR interval Left ventricular hypertrophy Artifact in lead(s) II aVR aVF V1 V2 V3 V4 V5 V6 No significant change since last tracing 07 May 2003 Confirmed by Rolland Porter [phone]) on 08/27/2018 12:47:14 AM   Radiology Ct Head Wo Contrast  Result Date: 08/27/2018 CLINICAL DATA:  Altered mental status and confusion. EXAM: CT HEAD WITHOUT CONTRAST TECHNIQUE: Contiguous axial images were obtained from the base of the skull through the vertex without intravenous contrast. COMPARISON:  MRI brain 12/31/2005 FINDINGS: Brain: Diffuse cerebral atrophy. Ventricular dilatation consistent with central atrophy. Low-attenuation changes in the deep white matter consistent with small vessel ischemia. No mass-effect or midline shift. No abnormal extra-axial fluid collections. Gray-white matter junctions are distinct. Basal cisterns are not effaced. No acute intracranial hemorrhage. Vascular: Moderate intracranial arterial calcifications are present. Skull: Calvarium appears intact. Sinuses/Orbits: Paranasal sinuses and mastoid air cells are clear. Other: None. IMPRESSION: No acute intracranial abnormalities. Chronic atrophy and small vessel ischemic changes. Electronically Signed   By: Lucienne Capers M.D.   On: 08/27/2018 01:51    Procedures Procedures (including critical care time)  Medications Ordered in ED Medications  cephALEXin (KEFLEX) capsule 500 mg (500 mg Oral Given 08/27/18 0316)     Initial Impression / Assessment and Plan / ED Course  I have reviewed the triage vital signs and the nursing notes.  Pertinent labs & imaging results that were available during my care of the patient were reviewed by me and considered in my medical decision making (see chart for details).     There was a sandwich at the bedside wrapped in paper towels.  It appeared to be a peanut butter and jelly sandwich.   Patient states her daughter made it for her prior to her leaving with EMS.  Patient was encouraged to eat it.  Laboratory testing was done including a head CT.  I am concerned about her memory problem.  I am not sure she has some early dementia or if she had some prolonged hypoglycemia.  Patient appeared to have a lot of memory problems.  CT of the head was done to make sure she did not have a stroke.  Patient's hypoglycemia was monitored, she had a CBG approximately every hour.  She has maintained her blood sugar in the low 200s  in high 100 range.  At 2:35AM  I spoke to her daughter Sharyn Lull.  I explained to her I was concerned about the patient's memory problems.  I had looked at her primary care doctor's notes from September 4 and in it he documents that she wanted her next eye appointment to be the same time as her husband's.  When I asked Sharyn Lull when the patient's husband died she said 08-16-2023.  Patient still cannot tell me when her husband died.  Daughter states they have been noticing she has been having short-term memory loss over several years but since the husband died it has gotten a lot worse.  She states the patient lives alone.  However her house is clean and patient is cooking and taking her medications.  She states her mother has a book that she writes into document what happens every day and she did document her sister came to see her today.  Daughter does not know who her last contact was but when she tried to call her tonight she states the answer machine picked up but that it appeared like someone tried to pick up the phone.  She also states the patient has been hiding things like important papers.  3:07 AM patient's urine has resulted and is suspicious for UTI.  Urine culture was sent she was started on oral Keflex.  I will talk to the hospitalist about admission since she does have a UTI, acute worsening of her memory and she lives alone.  3:34 AM Dr. Olevia Bowens, hospitalist will  admit.  Final Clinical Impressions(s) / ED Diagnoses   Final diagnoses:  Urinary tract infection without hematuria, site unspecified  Hypoglycemia  Memory difficulties  Depression, unspecified depression type    Plan admission  Rolland Porter, MD, Barbette Or, MD 08/27/18 [phone]

## 2018-08-27 NOTE — Discharge Summary (Addendum)
Physician Discharge Summary  Brianna Weber ERD:[id number] DOB: [birth year] DOA: 08/26/2018  PCP: [name], MD  Admit date: 08/26/2018 Discharge date: 08/27/2018  Admitted From: Home Disposition:  Home   Recommendations for Outpatient Follow-up:  1. Follow up with PCP in 1-2 weeks 2. Please obtain BMP/CBC in one week 3. Consider referral to neurology and/or neuropsychiatry for memory evaluation in future     Discharge Condition: Stable CODE STATUS: FULL Diet recommendation: Heart Healthy / Carb Modified    Brief/Interim Summary: 72 year old female with history of bradycardia, breast cancer status post mastectomy, diabetes mellitus, hyperlipidemia, hypertension, coronary disease, hypothyroidism presenting after an episode of altered mental status.  According to the patient, she was speaking to 1 of her daughters on the telephone at 8:30 PM on 08/26/2018.  The next thing she remembers was sitting on the recliner with EMS and the rest of her family surrounding her and the patient being transported to the hospital.  Apparently, CBG was noted to be 51 by EMS.  Patient was given D50 with improvement of her CBG to 322.  Work-up in the emergency department revealed essentially unremarkable BMP with serum creatinine 1.34 which is at the patient's baseline.  LFTs were unremarkable.  CBC showed WBC 11.9.  CT of the brain was unremarkable.  Urinalysis was negative for pyuria.  EKG shows sinus rhythm with no ST ST wave changes.  The patient was started on a D5W drip and admitted for further evaluation.  On the morning of 08/27/2018, the patient was alert and oriented and conversant. The patient herself admits that she has some forgetfulness.  As a mitigating strategy, she states that she writes reminders for herself and a date book regarding taking her medications as well as her insulin.  Nevertheless, the patient states that she has been eating well and has good oral intake.  She states that  she has not had any changes in any of her medications.  Interestingly, the patient is not able to tell me what times of day she takes her insulin or the dose of the insulin.  Since admission, the patient's D5W was turned off, and the patient's diet was advanced.  The patient did not experience any further hypoglycemia, and her mental status continued to improve.  The patient was discharged in stable condition with instructions to follow-up with her primary care provider.  Prior to d/c the patient revealed that she has been adjusting her insulin dose at home on her own without notifying her PCP.  Because of her poor memory, she was not able to tell me how many units she was administering.  All she could tell me was that she was having a number of low CBG readings which caused her to reduce her insulin dosing.  As a result, I have decreased her home 70/30 insulin to 25 units BID.  Instructions were given to check CBGs ac/hs and keep a glycemic log to take to her PCP to make future adjustment.  I explained to pt and family that she should call PCP or go to ED ASAP if she has CBG readings >400.  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 Livia Snellen noted that the patient's 70/30 insulin was 55 units a.m., 58 units p.m. -It is unclear if the patient incorrectly gave herself the wrong dose or doubled up on her dosing -pt has decreased her home dosing due to low CBG readings and did not notify her PCP -The patient did not want to stay in the hospital any longer to monitor her response to the reintroduction of her insulin dosing -restart 70/30 at 25 units bid--> I have discussed with the patient's family to monitor the patient's CBGs 4 times daily for the next week and keep a glycemic log -I have also discussed with the patient's family to monitor the patient's insulin dosing --> certainly if the patient has any hypoglycemic episodes or CBG >400, her primary care provider should be contacted immediately for dose adjustment  Essential hypertension -Initially held amlodipine and benazepril due to soft blood pressure -Restart after discharge  Hyperlipidemia -Continue statin  Coronary artery disease -Continue aspirin and Imdur   Discharge Instructions   Allergies as of 08/27/2018   No Known Allergies     Medication List    TAKE these medications   amLODipine 10 MG tablet Commonly known as:  NORVASC Take 1 tablet (10 mg total) by mouth daily.   aspirin 81 MG tablet Take 81 mg by mouth daily.   benazepril 20 MG tablet Commonly known as:  LOTENSIN Take 1 tablet (20 mg total) by mouth daily.   fexofenadine 180 MG tablet Commonly known as:  ALLEGRA Take 180 mg by mouth daily.   furosemide 40 MG tablet Commonly known as:  LASIX Take 1 tablet (40 mg total) by mouth daily.   glucose blood test strip Test bid.   insulin NPH-regular Human (70-30) 100 UNIT/ML injection Commonly known as:  NOVOLIN 70/30 25 units in AM and 25 units with supper What changed:  additional instructions   isosorbide mononitrate 60 MG 24 hr tablet Commonly known as:  IMDUR Take 1 tablet (60 mg total) by mouth daily.     levothyroxine 125 MCG tablet Commonly known as:  SYNTHROID, LEVOTHROID Take 1 tablet (125 mcg total) by mouth daily before breakfast. Start taking on:  08/28/2018 What changed:    medication strength  how much to take  when to take this   raloxifene 60 MG tablet Commonly known as:  EVISTA Take 1 tablet (60 mg total) by mouth daily.   simvastatin 20 MG tablet Commonly known as:  ZOCOR Take 1 tablet (20 mg total) by mouth daily.   vitamin B-12 500 MCG tablet Commonly known as:  CYANOCOBALAMIN Take 1 tablet (500 mcg total) by mouth daily.   vitamin D (CHOLECALCIFEROL) 400 units tablet Take 400 Units by mouth daily.       No Known Allergies  Consultations:  none   Procedures/Studies: Ct Head Wo Contrast  Result Date: 08/27/2018 CLINICAL DATA:  Altered mental status and confusion. EXAM: CT HEAD WITHOUT CONTRAST TECHNIQUE: Contiguous axial images were obtained from the base of the skull through the vertex without intravenous contrast. COMPARISON:  MRI brain 12/31/2005 FINDINGS: Brain: Diffuse cerebral atrophy. Ventricular dilatation consistent with central atrophy. Low-attenuation changes in the deep white matter consistent with  small vessel ischemia. No mass-effect or midline shift. No abnormal extra-axial fluid collections. Gray-white matter junctions are distinct. Basal cisterns are not effaced. No acute intracranial hemorrhage. Vascular: Moderate intracranial arterial calcifications are present. Skull: Calvarium appears intact. Sinuses/Orbits: Paranasal sinuses and mastoid air cells are clear. Other: None. IMPRESSION: No acute intracranial abnormalities. Chronic atrophy and small vessel ischemic changes. Electronically Signed   By: Lucienne Capers M.D.   On: 08/27/2018 01:51        Discharge Exam: Vitals:   08/27/18 0530 08/27/18 1500  BP: 128/63 (!) 133/53  Pulse: 66 67  Resp: 18 18  Temp: 98 F (36.7 C) 98.1 F (36.7 C)  SpO2: 100% 98%   Vitals:    08/27/18 0230 08/27/18 0401 08/27/18 0530 08/27/18 1500  BP: (!) 106/57 (!) 144/67 128/63 (!) 133/53  Pulse: (!) 54  66 67  Resp: 15  18 18   Temp:   98 F (36.7 C) 98.1 F (36.7 C)  TempSrc:   Oral   SpO2: 99%  100% 98%  Weight:   65.1 kg   Height:        General: Pt is alert, awake, not in acute distress Cardiovascular: RRR, S1/S2 +, no rubs, no gallops Respiratory: CTA bilaterally, no wheezing, no rhonchi Abdominal: Soft, NT, ND, bowel sounds + Extremities: no edema, no cyanosis   The results of significant diagnostics from this hospitalization (including imaging, microbiology, ancillary and laboratory) are listed below for reference.    Significant Diagnostic Studies: Ct Head Wo Contrast  Result Date: 08/27/2018 CLINICAL DATA:  Altered mental status and confusion. EXAM: CT HEAD WITHOUT CONTRAST TECHNIQUE: Contiguous axial images were obtained from the base of the skull through the vertex without intravenous contrast. COMPARISON:  MRI brain 12/31/2005 FINDINGS: Brain: Diffuse cerebral atrophy. Ventricular dilatation consistent with central atrophy. Low-attenuation changes in the deep white matter consistent with small vessel ischemia. No mass-effect or midline shift. No abnormal extra-axial fluid collections. Gray-white matter junctions are distinct. Basal cisterns are not effaced. No acute intracranial hemorrhage. Vascular: Moderate intracranial arterial calcifications are present. Skull: Calvarium appears intact. Sinuses/Orbits: Paranasal sinuses and mastoid air cells are clear. Other: None. IMPRESSION: No acute intracranial abnormalities. Chronic atrophy and small vessel ischemic changes. Electronically Signed   By: Lucienne Capers M.D.   On: 08/27/2018 01:51     Microbiology: No results found for this or any previous visit (from the past 240 hour(s)).   Labs: Basic Metabolic Panel: Recent Labs  Lab 08/27/18 0116  NA 138  K 4.0  CL 107  CO2 25  GLUCOSE 163*  BUN 15    CREATININE 1.34*  CALCIUM 9.3   Liver Function Tests: Recent Labs  Lab 08/27/18 0116  AST 21  ALT 14  ALKPHOS 87  BILITOT 0.7  PROT 7.0  ALBUMIN 3.8   No results for input(s): LIPASE, AMYLASE in the last 168 hours. No results for input(s): AMMONIA in the last 168 hours. CBC: Recent Labs  Lab 08/27/18 0116  WBC 11.9*  NEUTROABS 10.3*  HGB 12.1  HCT 37.3  MCV 85.6  PLT 328   Cardiac Enzymes: Recent Labs  Lab 08/27/18 0116  TROPONINI <0.03   BNP: Invalid input(s): POCBNP CBG: Recent Labs  Lab 08/27/18 0523 08/27/18 0753 08/27/18 1211 08/27/18 1427 08/27/18 1622  GLUCAP 121* 127* 158* 156* 127*    Time coordinating discharge:  65 minutes  Signed:  Orson Eva, DO [facility] Pager: [phone] 08/27/2018, 6:03 PM

## 2018-08-27 NOTE — H&P (Signed)
History and Physical    Brianna Weber DOB: 12/22/1945 DOA: 08/26/2018  PCP: [name], MD   Patient coming from: Home.  I have personally briefly reviewed patient's old medical records in [place]  Chief Complaint: Hypoglycemia  HPI: Brianna Weber is a 72 y.o. female with medical history significant of allergy, bradycardia, history of breast cancer, left mastectomy, diverticulosis of colon, hyperlipidemia, hypertension, CAD, history of NSTEMI, peripheral edema, hypothyroidism, vitamin D deficiency who was brought via EMS to the emergency department due to hypoglycemia.  Apparently her daughter in law called [place] [employer] for a wellness check.  EMS was also on seeing and checked her CBG which was 51 mg/dL.  She was given dextrose which increase her glucose momentarily to 322 mg/dL.  Once the patient came to the emergency department her CBG was 210, then the patient ate, then her follow-up CBG was 159 around 230 and was 147 mg/dL at 4:07 AM.  She stated that she felt a lot better.  She denies any fever, chills, headache, sore throat, chest pain, dyspnea, palpitations, diaphoresis, PND, orthopnea or recent lower extremity edema.  No abdominal pain, nausea, emesis, diarrhea, constipation, melena or hematochezia.  No dysuria, frequency or hematuria.  The patient stated that her blood glucose is usually controlled, but she frequently gets hypoglycemic in the 50s and 60s without significant symptoms.  She also mentioned that she believes that her son is responsible for calling EMS and the [place] PD to get her to the hospital so he could have abscess to her tractor, which he wants to buy, but apparently they have a disagreement on the payment of these vehicle.  She was alert, oriented x4 when she was examined n the emergency department.  She denied any other health concerns at this time.  ED Course: Initial vital signs temperature 98 F, pulse 60,  respirations 15, blood pressure 129/55 mmHg and O2 sat 100% on room air.  The patient was given a Keflex 500 mg capsule p.o. x1.  She was also fed with a PBJ sandwich and no other snacks.  Her white count was 11.9, hemoglobin 12.1 g/dL and platelets 328.  Troponin was normal.  CMP shows a creatinine of 1.34 (around her baseline) and a glucose of 163 mg/dL.  All other values were within normal limits.  Her urinalysis was positive for nitrites and small leukocyte esterase with rare bacteria.  A CT of her head was performed which did not show any acute intracranial abnormalities.  Review of Systems: As per HPI otherwise 10 point review of systems negative.   Past Medical History:  Diagnosis Date  . Allergy   . Arrhythmia    bradycardia  . Breast cancer [facility])    s/p left mastectomy  . Diabetes mellitus   . Diverticulosis of colon   . Hyperlipidemia   . Hypertension   . 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 Normal LV function by repeatr echo.  . Peripheral edema   . Thyroid disease    hypothyroidism  . Vitamin D deficiency     Past Surgical History:  Procedure Laterality Date  . ABDOMINAL HYSTERECTOMY    . CHOLECYSTECTOMY    . MASTECTOMY     left     reports that she has never smoked. She has never used smokeless tobacco. She reports that she does not drink alcohol or use drugs.  No Known  Allergies  Family History  Problem Relation Age of Onset  . Hypertension Mother   . Heart disease Mother   . Heart attack Father   . Heart disease Sister   . Heart disease Brother   . Cancer Maternal Aunt        breast   . Heart disease Maternal Aunt   . Hyperlipidemia Maternal Aunt   . Hypertension Maternal Aunt   . Heart disease Maternal Uncle   . Hyperlipidemia Maternal Uncle   . Hypertension Maternal Uncle   . Heart disease Maternal Grandmother   . Heart disease Sister   . Heart disease Sister   . Heart disease  Brother   . Diabetes Daughter   . Hypertension Unknown    Prior to Admission medications   Medication Sig Start Date End Date Taking? Authorizing Provider  amLODipine (NORVASC) 10 MG tablet Take 1 tablet (10 mg total) by mouth daily. 07/17/18   [name], MD  aspirin 81 MG tablet Take 81 mg by mouth daily.      [provider]  benazepril (LOTENSIN) 20 MG tablet Take 1 tablet (20 mg total) by mouth daily. 07/17/18   [name], MD  fexofenadine (ALLEGRA) 180 MG tablet Take 180 mg by mouth daily.      [provider]  furosemide (LASIX) 40 MG tablet Take 1 tablet (40 mg total) by mouth daily. 07/17/18   [name], MD  glucose blood (ONE TOUCH ULTRA TEST) test strip Test bid. 04/29/18   [name], MD  insulin NPH-regular Human (NOVOLIN 70/30) (70-30) 100 UNIT/ML injection 55 u in AM and 58U each evening 10/01/15   Cherre Robins, PharmD  isosorbide mononitrate (IMDUR) 60 MG 24 hr tablet Take 1 tablet (60 mg total) by mouth daily. 07/17/18   [name], MD  levothyroxine (SYNTHROID, LEVOTHROID) 112 MCG tablet Take 1 tablet (112 mcg total) by mouth daily. 07/17/18   [name], MD  raloxifene (EVISTA) 60 MG tablet Take 1 tablet (60 mg total) by mouth daily. 07/17/18   [name], MD  simvastatin (ZOCOR) 20 MG tablet Take 1 tablet (20 mg total) by mouth daily. 07/17/18   [name], MD  vitamin D, CHOLECALCIFEROL, 400 UNITS tablet Take 400 Units by mouth daily.      [provider]    Physical Exam: Vitals:   08/27/18 0200 08/27/18 0215 08/27/18 0230 08/27/18 0401  BP: (!) 91/46  (!) 106/57 (!) 144/67  Pulse: (!) 59 (!) 56 (!) 54   Resp: 14 12 15    SpO2: 98% 99% 99%   Weight:      Height:        Constitutional: NAD, calm, comfortable Eyes: PERRL, lids and conjunctivae normal ENMT: Mucous membranes are moist. Posterior pharynx clear of any exudate or lesions. Neck: Normal, supple, no masses, no thyromegaly Respiratory: Clear to auscultation  bilaterally, no wheezing, no crackles. Normal respiratory effort. No accessory muscle use.  Cardiovascular: Regular rate and rhythm, no murmurs / rubs / gallops. No extremity edema. 2+ pedal pulses. No carotid bruits.  Abdomen: Soft, no tenderness, no masses palpated. No hepatosplenomegaly. Bowel sounds positive.  Musculoskeletal: no clubbing / cyanosis. Good ROM, no contractures. Normal muscle tone.  Skin: no rashes, lesions, ulcers. No induration Neurologic: CN 2-12 grossly intact. Sensation intact, DTR normal. Strength 5/5 in all 4.  Psychiatric: Normal judgment and insight. Alert and oriented x 3. Normal mood.   Labs on Admission: I have personally reviewed following labs and imaging studies  CBC: Recent Labs  Lab 08/27/18 0116  WBC 11.9*  NEUTROABS 10.3*  HGB 12.1  HCT 37.3  MCV 85.6  PLT 202   Basic Metabolic Panel: Recent Labs  Lab 08/27/18 0116  NA 138  K 4.0  CL 107  CO2 25  GLUCOSE 163*  BUN 15  CREATININE 1.34*  CALCIUM 9.3   GFR: Estimated Creatinine Clearance: 35.1 mL/min (A) (by C-G formula based on SCr of 1.34 mg/dL (H)). Liver Function Tests: Recent Labs  Lab 08/27/18 0116  AST 21  ALT 14  ALKPHOS 87  BILITOT 0.7  PROT 7.0  ALBUMIN 3.8   No results for input(s): LIPASE, AMYLASE in the last 168 hours. No results for input(s): AMMONIA in the last 168 hours. Coagulation Profile: No results for input(s): INR, PROTIME in the last 168 hours. Cardiac Enzymes: Recent Labs  Lab 08/27/18 0116  TROPONINI <0.03   BNP (last 3 results) No results for input(s): PROBNP in the last 8760 hours. HbA1C: No results for input(s): HGBA1C in the last 72 hours. CBG: Recent Labs  Lab 08/26/18 2352 08/27/18 0231 08/27/18 0407  GLUCAP 210* 159* 147*   Lipid Profile: No results for input(s): CHOL, HDL, LDLCALC, TRIG, CHOLHDL, LDLDIRECT in the last 72 hours. Thyroid Function Tests: Recent Labs    08/27/18 0116  TSH 4.787*   Anemia Panel: No results for  input(s): VITAMINB12, FOLATE, FERRITIN, TIBC, IRON, RETICCTPCT in the last 72 hours. Urine analysis:    Component Value Date/Time   COLORURINE YELLOW 08/27/2018 0037   APPEARANCEUR CLEAR 08/27/2018 0037   APPEARANCEUR Hazy (A) 12/17/2017 0959   LABSPEC 1.005 08/27/2018 0037   PHURINE 5.0 08/27/2018 0037   GLUCOSEU 50 (A) 08/27/2018 0037   HGBUR NEGATIVE 08/27/2018 0037   BILIRUBINUR NEGATIVE 08/27/2018 0037   BILIRUBINUR Negative 12/17/2017 0959   KETONESUR NEGATIVE 08/27/2018 0037   PROTEINUR NEGATIVE 08/27/2018 0037   NITRITE POSITIVE (A) 08/27/2018 0037   LEUKOCYTESUR SMALL (A) 08/27/2018 0037   LEUKOCYTESUR 3+ (A) 12/17/2017 0959    Radiological Exams on Admission: Ct Head Wo Contrast  Result Date: 08/27/2018 CLINICAL DATA:  Altered mental status and confusion. EXAM: CT HEAD WITHOUT CONTRAST TECHNIQUE: Contiguous axial images were obtained from the base of the skull through the vertex without intravenous contrast. COMPARISON:  MRI brain 12/31/2005 FINDINGS: Brain: Diffuse cerebral atrophy. Ventricular dilatation consistent with central atrophy. Low-attenuation changes in the deep white matter consistent with small vessel ischemia. No mass-effect or midline shift. No abnormal extra-axial fluid collections. Gray-white matter junctions are distinct. Basal cisterns are not effaced. No acute intracranial hemorrhage. Vascular: Moderate intracranial arterial calcifications are present. Skull: Calvarium appears intact. Sinuses/Orbits: Paranasal sinuses and mastoid air cells are clear. Other: None. IMPRESSION: No acute intracranial abnormalities. Chronic atrophy and small vessel ischemic changes. Electronically Signed   By: Lucienne Capers M.D.   On: 08/27/2018 01:51    EKG: Independently reviewed. Vent. rate 58 BPM PR interval * ms QRS duration 123 ms QT/QTc 476/468 ms P-R-T axes 138 -33 -11 Sinus or ectopic atrial rhythm Short PR interval Left ventricular hypertrophy Artifact in  lead(s) II aVR aVF V1 V2 V3 V4 V5 V6  Assessment/Plan Principal Problem:   Hypoglycemia Observation/telemetry. The patient has eaten several food items, including a PBJ sandwich.. Continue carbohydrate modified diet. Monitor CBG closely. Dextrose 5% infusion.  Discontinue if CBG is increasing.  Active Problems:   Hypothyroidism Continue levothyroxine 112 mcg p.o. daily. Check TSH level. Adjust if needed.    Essential hypertension Continue  amlodipine 10 mg p.o. daily. Continue benazepril 20 mg p.o. Daily. Hold furosemide for now. Monitor blood pressure, renal function electrolytes.    Bradycardia Check TSH. Monitor heart rate.    Type 2 diabetes mellitus (HCC) Carbohydrate modified diet. Hold insulin for now. CBG monitoring every 4 hours.    Hyperlipidemia Continue simvastatin 20 mg p.o. daily.    Breast cancer ([place]) Status post mastectomy. Continue raloxifene.   DVT prophylaxis: Heparin SQ. Code Status: Full code. Family Communication: None at bedside. Disposition Plan: Observation for hypoglycemia. Consults called: Admission status: Observation/telemetry.   Reubin Milan MD [facility] Pager [phone].  If 7PM-7AM, please contact night-coverage [email or web address] Password [facility]  08/27/2018, 5:17 AM   This document was prepared using Dragon voice recognition software and may contain some unintended transcription errors.

## 2018-08-28 ENCOUNTER — Ambulatory Visit: Payer: Medicare Other | Admitting: Family Medicine

## 2018-08-29 LAB — URINE CULTURE
Culture: >=100000 — AB
Special Requests: NORMAL

## 2018-08-30 ENCOUNTER — Encounter: Payer: Self-pay | Admitting: Family Medicine

## 2018-08-30 ENCOUNTER — Ambulatory Visit (INDEPENDENT_AMBULATORY_CARE_PROVIDER_SITE_OTHER): Payer: Medicare Other | Admitting: Family Medicine

## 2018-08-30 ENCOUNTER — Encounter: Payer: Self-pay | Admitting: Neurology

## 2018-08-30 VITALS — BP 126/76 | HR 70 | Temp 99.4°F | Ht 62.0 in | Wt 145.0 lb

## 2018-08-30 DIAGNOSIS — E119 Type 2 diabetes mellitus without complications: Secondary | ICD-10-CM | POA: Diagnosis not present

## 2018-08-30 DIAGNOSIS — E11649 Type 2 diabetes mellitus with hypoglycemia without coma: Secondary | ICD-10-CM | POA: Diagnosis not present

## 2018-08-30 DIAGNOSIS — R413 Other amnesia: Secondary | ICD-10-CM

## 2018-08-30 DIAGNOSIS — R410 Disorientation, unspecified: Secondary | ICD-10-CM | POA: Diagnosis not present

## 2018-08-30 DIAGNOSIS — Z794 Long term (current) use of insulin: Secondary | ICD-10-CM | POA: Diagnosis not present

## 2018-08-30 MED ORDER — NITROFURANTOIN MONOHYD MACRO 100 MG PO CAPS: 100.0000 mg | ORAL_CAPSULE | Freq: Two times a day (BID) | ORAL | 0 refills | Status: DC

## 2018-08-30 NOTE — Patient Instructions (Signed)
Check and write down blood sugar before meals and at bedtime.   Urinary Tract Infection, Adult A urinary tract infection (UTI) is an infection of any part of the urinary tract. The urinary tract includes the:  Kidneys.  Ureters.  Bladder.  Urethra.  These organs make, store, and get rid of pee (urine) in the body. Follow these instructions at home:  Take over-the-counter and prescription medicines only as told by your doctor.  If you were prescribed an antibiotic medicine, take it as told by your doctor. Do not stop taking the antibiotic even if you start to feel better.  Avoid the following drinks: ? Alcohol. ? Caffeine. ? Tea. ? Carbonated drinks.  Drink enough fluid to keep your pee clear or pale yellow.  Keep all follow-up visits as told by your doctor. This is important.  Make sure to: ? Empty your bladder often and completely. Do not to hold pee for long periods of time. ? Empty your bladder before and after sex. ? Wipe from front to back after a bowel movement if you are female. Use each tissue one time when you wipe. Contact a doctor if:  You have back pain.  You have a fever.  You feel sick to your stomach (nauseous).  You throw up (vomit).  Your symptoms do not get better after 3 days.  Your symptoms go away and then come back. Get help right away if:  You have very bad back pain.  You have very bad lower belly (abdominal) pain.  You are throwing up and cannot keep down any medicines or water. This information is not intended to replace advice given to you by your health care provider. Make sure you discuss any questions you have with your health care provider. Document Released: 04/17/2008 Document Revised: 04/06/2016 Document Reviewed: 09/20/2015 Elsevier Interactive Patient Education  [employer].

## 2018-08-30 NOTE — Progress Notes (Signed)
Subjective:  Patient ID: Brianna Weber, female    DOB: [birth year]  Age: 72 y.o. MRN: [id number]  CC: Hospitalization Follow-up (UTI and memor loss/confusion. Patient was not treated for UTI. Needs repeat CBC/BMP and referral to neurology. )   HPI Brianna Weber presents for recheck after overnight hospitalization earlier this week. There was evidence for UTI that needs to be treated and she was hypoglycemic. Family notes that she is experiencing forgetfulness and confusion. Express concern that meds are causing it. Specifically want to know the purpose of each medication. Evista has side effect they noted of causing confusion so they want it stopped. He insulin was reduced to 25 units of 70/30 BID due to concern for hypoglycemia. Pt. cannot afford the copay for longer acting, baseline insulin anymore. [facility]. Pt. Was noted to have confusion with understanding directions given there by him and others. She doen't seem to understand the connection between the 70 percent N that requires eating a meal 4-6 hours later. Pt. Also mourning the loss of her husband on 9/25 and breaks down in tears at his name being mentioned.  Extensive review of the medication list was reviewed with the family.  There is also concern about her having to take so many blood pressure medicines.    Depression screen [facility] 2/9 08/30/2018 07/17/2018 03/19/2018  Decreased Interest 1 0 0  Down, Depressed, Hopeless 1 0 1  PHQ - 2 Score 2 0 1  Altered sleeping 0 - -  Tired, decreased energy 1 - -  Change in appetite 1 - -  Feeling bad or failure about yourself  0 - -  Trouble concentrating 1 - -  Moving slowly or fidgety/restless 0 - -  Suicidal thoughts 0 - -  PHQ-9 Score 5 - -  Difficult doing work/chores - - -    History Brianna Weber has a past medical history of Allergy, Arrhythmia, Breast cancer ([place]), Diabetes mellitus, Diverticulosis of colon, Hyperlipidemia, Hypertension, NSTEMI (non-ST  elevated myocardial infarction) ([place]), Peripheral edema, Thyroid disease, and Vitamin D deficiency.   She has a past surgical history that includes Mastectomy; Cholecystectomy; and Abdominal hysterectomy.   Her family history includes Cancer in her maternal aunt; Diabetes in her daughter; Heart attack in her father; Heart disease in her brother, brother, maternal aunt, maternal grandmother, maternal uncle, mother, sister, sister, and sister; Hyperlipidemia in her maternal aunt and maternal uncle; Hypertension in her maternal aunt, maternal uncle, mother, and unknown relative.She reports that she has never smoked. She has never used smokeless tobacco. She reports that she does not drink alcohol or use drugs.    ROS Review of Systems  Constitutional: Positive for activity change and appetite change (better).  HENT: Negative for congestion.   Eyes: Negative for visual disturbance.  Respiratory: Negative for shortness of breath.   Cardiovascular: Negative for chest pain.  Gastrointestinal: Negative for abdominal pain, constipation, diarrhea, nausea and vomiting.  Genitourinary: Negative for difficulty urinating.  Musculoskeletal: Negative for arthralgias and myalgias.  Neurological: Negative for headaches.  Psychiatric/Behavioral: Positive for confusion and decreased concentration. Negative for agitation, behavioral problems, dysphoric mood and sleep disturbance. The patient is nervous/anxious.     Objective:  BP 126/76 (BP Location: Right Arm)   Pulse 70   Temp 99.4 F (37.4 C) (Oral)   Ht 5\' 2"  (1.575 m)   Wt 145 lb (65.8 kg)   BMI 26.52 kg/m   BP Readings from Last 3 Encounters:  08/30/18 126/76  08/27/18 Marland Kitchen)  133/53  07/17/18 (!) 164/89    Wt Readings from Last 3 Encounters:  08/30/18 145 lb (65.8 kg)  08/27/18 143 lb 8.3 oz (65.1 kg)  07/17/18 148 lb (67.1 kg)     Physical Exam  Constitutional: She is oriented to person, place, and time. She appears well-developed and  well-nourished. No distress.  HENT:  Head: Normocephalic and atraumatic.  Right Ear: External ear normal.  Left Ear: External ear normal.  Nose: Nose normal.  Mouth/Throat: Oropharynx is clear and moist.  Eyes: Pupils are equal, round, and reactive to light. Conjunctivae and EOM are normal.  Neck: Normal range of motion. Neck supple. No thyromegaly present.  Cardiovascular: Normal rate, regular rhythm and normal heart sounds.  No murmur heard. Pulmonary/Chest: Effort normal and breath sounds normal. No respiratory distress. She has no wheezes. She has no rales.  Abdominal: Soft. Bowel sounds are normal. She exhibits no distension. There is no tenderness.  Lymphadenopathy:    She has no cervical adenopathy.  Neurological: She is alert and oriented to person, place, and time. She has normal reflexes.  Skin: Skin is warm and dry.  Psychiatric: She has a normal mood and affect. Her behavior is normal. Thought content normal. Her speech is rapid and/or pressured. Thought content is not paranoid and not delusional. Cognition and memory are impaired. She expresses impulsivity. She expresses no suicidal ideation. She exhibits abnormal recent memory.      Assessment & Plan:   Brianna Weber was seen today for hospitalization follow-up.  Diagnoses and all orders for this visit:  Diabetic hypoglycemia ([place])  Memory loss -     Ambulatory referral to [place] -     Ambulatory referral to Neurology  Type 2 diabetes mellitus without complication, with long-term current use of insulin ([place]) -     Amb ref to Medical Nutrition Therapy-MNT  Mental confusion  Other orders -     nitrofurantoin, macrocrystal-monohydrate, (MACROBID) 100 MG capsule; Take 1 capsule (100 mg total) by mouth 2 (two) times daily. 1 po BId       I have discontinued Brianna Weber. Brianna Weber's raloxifene. I am also having her start on nitrofurantoin (macrocrystal-monohydrate). Additionally, I am having her maintain her  aspirin, fexofenadine, vitamin D (CHOLECALCIFEROL), glucose blood, simvastatin, isosorbide mononitrate, furosemide, benazepril, amLODipine, levothyroxine, insulin NPH-regular Human, and vitamin B-12.  Allergies as of 08/30/2018   No Known Allergies     Medication List        Accurate as of 08/30/18 11:59 PM. Always use your most recent med list.          amLODipine 10 MG tablet Commonly known as:  NORVASC Take 1 tablet (10 mg total) by mouth daily.   aspirin 81 MG tablet Take 81 mg by mouth daily.   benazepril 20 MG tablet Commonly known as:  LOTENSIN Take 1 tablet (20 mg total) by mouth daily.   fexofenadine 180 MG tablet Commonly known as:  ALLEGRA Take 180 mg by mouth daily.   furosemide 40 MG tablet Commonly known as:  LASIX Take 1 tablet (40 mg total) by mouth daily.   glucose blood test strip Test bid.   insulin NPH-regular Human (70-30) 100 UNIT/ML injection Commonly known as:  NOVOLIN 70/30 25 units in AM and 25 units with supper   isosorbide mononitrate 60 MG 24 hr tablet Commonly known as:  IMDUR Take 1 tablet (60 mg total) by mouth daily.   levothyroxine 125 MCG tablet Commonly known as:  SYNTHROID, LEVOTHROID Take  1 tablet (125 mcg total) by mouth daily before breakfast.   nitrofurantoin (macrocrystal-monohydrate) 100 MG capsule Commonly known as:  MACROBID Take 1 capsule (100 mg total) by mouth 2 (two) times daily. 1 po BId   simvastatin 20 MG tablet Commonly known as:  ZOCOR Take 1 tablet (20 mg total) by mouth daily.   vitamin B-12 500 MCG tablet Commonly known as:  CYANOCOBALAMIN Take 1 tablet (500 mcg total) by mouth daily.   vitamin D (CHOLECALCIFEROL) 400 units tablet Take 400 Units by mouth daily.      There was extensive discussion today regarding her recent hypoglycemic episode and the memory loss and confusion that seems to be promoting this.  She is not remembering her insulin well she is not running to eat properly apparently.   Family is very concerned this is coming from multiple medications.  I shared her concern.  Therefore her blocks of them has been discontinued as of today.  Additionally she has an appointment upcoming for a neuropsychiatric evaluation for dementia.  The medication specifically for that will be deferred until after the evaluation has been performed.  In the meantime we had extensive discussion about proper use of 70/30 insulin.  She will go ahead with 25 units twice daily.  However she will also need to keep an accurate log of all of her glucose readings before breakfast lunch supper and bedtime.  Along with that an extensive discussion about proper carbohydrate ingestion was undertaken.  There are 3 family members all of whom gives some of the history.  1 of those tells me that he gave her a biscuit when her sugar dropped to about 72 a day or 2 ago.  When he gets that low she tends to go even lower and bottom out.  They do realize that biscuits are carbohydrate rich and generally simple carbohydrate is the form found in them.  They understand that whole-wheat carbs are more complex and we discussed the fact that although she should have complex carbs when she has carbs she still needs to limit the total amount of carbohydrate in her diet.  She does ingest other forms of carbohydrate that were reviewed including rice and potatoes and other starchy vegetables.  Each family gives a bit of history here that is sometimes conflicting with regard to the patient's history of diet.  It is unclear just exactly to what extent she is indulging in carbohydrates.  50 Mins. was spent with this patient more than half of which was spent in counseling regarding her diabetic hypoglycemia and interventions as mentioned above.  Also in discussion of her confusional state and the various potential causes.  This included the plan for neuropsychiatric testing and reevaluating medications.  Of note is that, although not hostile the family  members were at times confrontational regarding her medication regimen.  Follow-up: Return in about 1 month (around 09/30/2018).  [name], M.D.

## 2018-09-01 ENCOUNTER — Encounter: Payer: Self-pay | Admitting: Family Medicine

## 2018-09-13 ENCOUNTER — Telehealth: Payer: Self-pay

## 2018-09-13 NOTE — Telephone Encounter (Signed)
Patient has asked that [place] start 11/5 so that's when it will start

## 2018-09-16 ENCOUNTER — Ambulatory Visit (INDEPENDENT_AMBULATORY_CARE_PROVIDER_SITE_OTHER): Payer: Medicare Other | Admitting: Family Medicine

## 2018-09-16 ENCOUNTER — Encounter: Payer: Self-pay | Admitting: Family Medicine

## 2018-09-16 VITALS — BP 124/55 | HR 71 | Temp 97.3°F | Ht 62.0 in | Wt 145.0 lb

## 2018-09-16 DIAGNOSIS — R410 Disorientation, unspecified: Secondary | ICD-10-CM

## 2018-09-16 DIAGNOSIS — F4321 Adjustment disorder with depressed mood: Secondary | ICD-10-CM | POA: Diagnosis not present

## 2018-09-16 DIAGNOSIS — Z794 Long term (current) use of insulin: Secondary | ICD-10-CM

## 2018-09-16 DIAGNOSIS — G9389 Other specified disorders of brain: Secondary | ICD-10-CM | POA: Diagnosis not present

## 2018-09-16 DIAGNOSIS — Z87440 Personal history of urinary (tract) infections: Secondary | ICD-10-CM

## 2018-09-16 DIAGNOSIS — E119 Type 2 diabetes mellitus without complications: Secondary | ICD-10-CM

## 2018-09-16 DIAGNOSIS — R6889 Other general symptoms and signs: Secondary | ICD-10-CM | POA: Diagnosis not present

## 2018-09-16 LAB — URINALYSIS, COMPLETE
Bilirubin, UA: NEGATIVE
Glucose, UA: NEGATIVE
Ketones, UA: NEGATIVE
Nitrite, UA: POSITIVE — AB
Protein, UA: NEGATIVE
RBC, UA: NEGATIVE
SPEC GRAV UA: 1.02 (ref 1.005–1.030)
Urobilinogen, Ur: 0.2 mg/dL (ref 0.2–1.0)
pH, UA: 5 (ref 5.0–7.5)

## 2018-09-16 LAB — MICROSCOPIC EXAMINATION: Renal Epithel, UA: NONE SEEN /hpf

## 2018-09-16 NOTE — Progress Notes (Signed)
Subjective:  Patient ID: Brianna Weber, female    DOB: [birth year]  Age: 72 y.o. MRN: [id number]  CC: Urinary Tract Infection (2 week follow up )   HPI Brianna Weber presents for recheck of multiple conditions including a UTI.  She been treated for that hospital that is after hospitalization.  She denies any symptoms except that she has chronic urgency.  She wears a pad.  Initially she is wore a diaper.  However, that need is no longer there.  She does not have any dysuria.  She brings in her glucose monitor with readings showing 4 times a day.  She had one low sugar reaction close to bedtime 3 days ago.  A couple of other lows of into the 60s once to 90 once.  Otherwise sugars running in the low 100s fasting later in the day anywhere from 1 60-2 25.  Patient says she is doing better with regard to grief.  She feels less confused as well she thinks the grief was part of the confusion.  Her daughter is with her and says that she been taking Benadryl twice a day and she took that away from her and that is help with the brain fog.  However her daughter shakes her head adamantly know when the patient says she is not experiencing the brain fog anymore.  She does not elaborate except to say that there is some concern for confusion at times.  They would like to have brain scan to follow-up on the nodule noted on the previous scan. Depression screen [facility] 2/9 09/16/2018 08/30/2018 07/17/2018  Decreased Interest 2 1 0  Down, Depressed, Hopeless 0 1 0  PHQ - 2 Score 2 2 0  Altered sleeping 0 0 -  Tired, decreased energy 0 1 -  Change in appetite 0 1 -  Feeling bad or failure about yourself  0 0 -  Trouble concentrating 0 1 -  Moving slowly or fidgety/restless 0 0 -  Suicidal thoughts 0 0 -  PHQ-9 Score 2 5 -  Difficult doing work/chores Somewhat difficult - -    History Donnette has a past medical history of Allergy, Arrhythmia, Breast cancer ([place]), Diabetes mellitus, Diverticulosis of  colon, Hyperlipidemia, Hypertension, NSTEMI (non-ST elevated myocardial infarction) ([place]), Peripheral edema, Thyroid disease, and Vitamin D deficiency.   She has a past surgical history that includes Mastectomy; Cholecystectomy; and Abdominal hysterectomy.   Her family history includes Cancer in her maternal aunt; Diabetes in her daughter; Heart attack in her father; Heart disease in her brother, brother, maternal aunt, maternal grandmother, maternal uncle, mother, sister, sister, and sister; Hyperlipidemia in her maternal aunt and maternal uncle; Hypertension in her maternal aunt, maternal uncle, mother, and unknown relative.She reports that she has never smoked. She has never used smokeless tobacco. She reports that she does not drink alcohol or use drugs.    ROS Review of Systems  Constitutional: Negative.   HENT: Negative for congestion.   Eyes: Negative for visual disturbance.  Respiratory: Negative for shortness of breath.   Cardiovascular: Negative for chest pain.  Gastrointestinal: Negative for abdominal pain, constipation, diarrhea, nausea and vomiting.  Genitourinary: Positive for urgency. Negative for difficulty urinating.  Musculoskeletal: Negative for arthralgias and myalgias.  Neurological: Positive for dizziness. Negative for headaches.  Psychiatric/Behavioral: Positive for confusion and decreased concentration. Negative for sleep disturbance.    Objective:  BP (!) 124/55 (BP Location: Left Arm)   Pulse 71   Temp (!) 97.3 F (36.3 C) (  Oral)   Ht 5\' 2"  (1.575 m)   Wt 145 lb (65.8 kg)   BMI 26.52 kg/m   BP Readings from Last 3 Encounters:  09/16/18 (!) 124/55  08/30/18 126/76  08/27/18 (!) 133/53    Wt Readings from Last 3 Encounters:  09/16/18 145 lb (65.8 kg)  08/30/18 145 lb (65.8 kg)  08/27/18 143 lb 8.3 oz (65.1 kg)     Physical Exam  Constitutional: She is oriented to person, place, and time. She appears well-developed and well-nourished. No distress.    HENT:  Head: Normocephalic and atraumatic.  Right Ear: External ear normal.  Left Ear: External ear normal.  Eyes: Pupils are equal, round, and reactive to light. Conjunctivae and EOM are normal.  Neck: Normal range of motion. Neck supple.  Pulmonary/Chest: Effort normal.  Abdominal: She exhibits no distension.  Neurological: She is alert and oriented to person, place, and time. She has normal reflexes.  Skin: Skin is warm and dry.  Psychiatric: She has a normal mood and affect. Her behavior is normal. Judgment and thought content normal.      Assessment & Plan:   Avagrace was seen today for urinary tract infection.  Diagnoses and all orders for this visit:  Type 2 diabetes mellitus without complication, with long-term current use of insulin (H[facility])  Recent urinary tract infection -     Urinalysis, Complete -     Urine Culture  Confusion -     MR Brain W Wo Contrast; Future  Grief -     MR Brain W Wo Contrast; Future  Mass, brain -     MR Brain W Wo Contrast; Future       I have discontinued [name]. Ovando's nitrofurantoin (macrocrystal-monohydrate). I am also having her maintain her aspirin, fexofenadine, vitamin D (CHOLECALCIFEROL), glucose blood, simvastatin, isosorbide mononitrate, furosemide, benazepril, amLODipine, levothyroxine, insulin NPH-regular Human, and vitamin B-12.  Allergies as of 09/16/2018   No Known Allergies     Medication List        Accurate as of 09/16/18 10:56 AM. Always use your most recent med list.          amLODipine 10 MG tablet Commonly known as:  NORVASC Take 1 tablet (10 mg total) by mouth daily.   aspirin 81 MG tablet Take 81 mg by mouth daily.   benazepril 20 MG tablet Commonly known as:  LOTENSIN Take 1 tablet (20 mg total) by mouth daily.   fexofenadine 180 MG tablet Commonly known as:  ALLEGRA Take 180 mg by mouth daily.   furosemide 40 MG tablet Commonly known as:  LASIX Take 1 tablet (40 mg total) by  mouth daily.   glucose blood test strip Test bid.   insulin NPH-regular Human (70-30) 100 UNIT/ML injection 25 units in AM and 25 units with supper   isosorbide mononitrate 60 MG 24 hr tablet Commonly known as:  IMDUR Take 1 tablet (60 mg total) by mouth daily.   levothyroxine 125 MCG tablet Commonly known as:  SYNTHROID, LEVOTHROID Take 1 tablet (125 mcg total) by mouth daily before breakfast.   simvastatin 20 MG tablet Commonly known as:  ZOCOR Take 1 tablet (20 mg total) by mouth daily.   vitamin B-12 500 MCG tablet Commonly known as:  CYANOCOBALAMIN Take 1 tablet (500 mcg total) by mouth daily.   vitamin D (CHOLECALCIFEROL) 400 units tablet Take 400 Units by mouth daily.      Patient was encouraged to follow through with the neurology  referral.  She appears to be colonized with her urine.  If symptoms develop she will let me know.  If the culture is positive we will go ahead with treatment that is pending.  Follow-up: Return in about 3 months (around 12/17/2018).  [name], M.D.

## 2018-09-17 DIAGNOSIS — Z8673 Personal history of transient ischemic attack (TIA), and cerebral infarction without residual deficits: Secondary | ICD-10-CM | POA: Diagnosis not present

## 2018-09-17 DIAGNOSIS — I252 Old myocardial infarction: Secondary | ICD-10-CM | POA: Diagnosis not present

## 2018-09-17 DIAGNOSIS — E119 Type 2 diabetes mellitus without complications: Secondary | ICD-10-CM | POA: Diagnosis not present

## 2018-09-17 DIAGNOSIS — E785 Hyperlipidemia, unspecified: Secondary | ICD-10-CM | POA: Diagnosis not present

## 2018-09-17 DIAGNOSIS — Z9181 History of falling: Secondary | ICD-10-CM | POA: Diagnosis not present

## 2018-09-17 DIAGNOSIS — E039 Hypothyroidism, unspecified: Secondary | ICD-10-CM | POA: Diagnosis not present

## 2018-09-17 DIAGNOSIS — I10 Essential (primary) hypertension: Secondary | ICD-10-CM | POA: Diagnosis not present

## 2018-09-17 DIAGNOSIS — Z794 Long term (current) use of insulin: Secondary | ICD-10-CM | POA: Diagnosis not present

## 2018-09-17 DIAGNOSIS — Z7982 Long term (current) use of aspirin: Secondary | ICD-10-CM | POA: Diagnosis not present

## 2018-09-17 DIAGNOSIS — K5730 Diverticulosis of large intestine without perforation or abscess without bleeding: Secondary | ICD-10-CM | POA: Diagnosis not present

## 2018-09-17 DIAGNOSIS — I2510 Atherosclerotic heart disease of native coronary artery without angina pectoris: Secondary | ICD-10-CM | POA: Diagnosis not present

## 2018-09-17 DIAGNOSIS — E559 Vitamin D deficiency, unspecified: Secondary | ICD-10-CM | POA: Diagnosis not present

## 2018-09-17 DIAGNOSIS — Z853 Personal history of malignant neoplasm of breast: Secondary | ICD-10-CM | POA: Diagnosis not present

## 2018-09-18 ENCOUNTER — Other Ambulatory Visit: Payer: Self-pay | Admitting: Family Medicine

## 2018-09-18 LAB — URINE CULTURE

## 2018-09-18 MED ORDER — NITROFURANTOIN MONOHYD MACRO 100 MG PO CAPS: 100.0000 mg | ORAL_CAPSULE | Freq: Two times a day (BID) | ORAL | 0 refills | Status: DC

## 2018-09-19 ENCOUNTER — Ambulatory Visit (HOSPITAL_COMMUNITY): Payer: Medicare Other

## 2018-09-20 ENCOUNTER — Ambulatory Visit (HOSPITAL_COMMUNITY)
Admission: RE | Admit: 2018-09-20 | Discharge: 2018-09-20 | Disposition: A | Discharge status: Home / Self Care | Payer: Medicare Other | Source: Ambulatory Visit | Attending: Family Medicine | Admitting: Family Medicine

## 2018-09-20 DIAGNOSIS — R410 Disorientation, unspecified: Secondary | ICD-10-CM | POA: Insufficient documentation

## 2018-09-20 DIAGNOSIS — R4020 Unspecified coma: Secondary | ICD-10-CM | POA: Diagnosis not present

## 2018-09-20 DIAGNOSIS — F4321 Adjustment disorder with depressed mood: Secondary | ICD-10-CM | POA: Insufficient documentation

## 2018-09-20 DIAGNOSIS — G9389 Other specified disorders of brain: Secondary | ICD-10-CM | POA: Insufficient documentation

## 2018-09-20 MED ORDER — GADOBUTROL 1 MMOL/ML IV SOLN
6.0000 mL | Freq: Once | INTRAVENOUS | Status: AC | PRN
  Administered 2018-09-20: 6 mL via INTRAVENOUS

## 2018-09-25 ENCOUNTER — Other Ambulatory Visit: Payer: Self-pay | Admitting: Family Medicine

## 2018-09-25 ENCOUNTER — Other Ambulatory Visit: Payer: Self-pay | Admitting: *Deleted

## 2018-09-25 MED ORDER — ISOSORBIDE MONONITRATE ER 60 MG PO TB24: 60.0000 mg | ORAL_TABLET | Freq: Every day | ORAL | 0 refills | Status: DC

## 2018-09-25 MED ORDER — GLUCOSE BLOOD VI STRP: ORAL_STRIP | 3 refills | Status: DC

## 2018-09-25 MED ORDER — AMLODIPINE BESYLATE 10 MG PO TABS: 10.0000 mg | ORAL_TABLET | Freq: Every day | ORAL | 0 refills | Status: DC

## 2018-09-25 MED ORDER — BENAZEPRIL HCL 20 MG PO TABS: 20.0000 mg | ORAL_TABLET | Freq: Every day | ORAL | 0 refills | Status: DC

## 2018-09-25 MED ORDER — FUROSEMIDE 40 MG PO TABS: ORAL_TABLET | ORAL | 0 refills | Status: DC

## 2018-09-25 MED ORDER — SIMVASTATIN 10 MG PO TABS: 10.0000 mg | ORAL_TABLET | Freq: Every day | ORAL | 1 refills | Status: DC

## 2018-09-25 NOTE — Telephone Encounter (Signed)
VM pt would like all med to be sent to [facility] on [address] [place] back to pt, she has moved in with her sister and would like her medications to be sent to [facility], does not like them to go through mail order. Refills sent

## 2018-09-26 ENCOUNTER — Telehealth: Payer: Self-pay | Admitting: Family Medicine

## 2018-09-26 MED ORDER — LEVOTHYROXINE SODIUM 125 MCG PO TABS: 125.0000 ug | ORAL_TABLET | Freq: Every day | ORAL | 3 refills | Status: DC

## 2018-09-26 NOTE — Telephone Encounter (Signed)
What is the name of the medication? levothyroxine (SYNTHROID, LEVOTHROID) 125 MCG tablet  Have you contacted your pharmacy to request a refill? No  Which pharmacy would you like this sent to? [facility]   Patient notified that their request is being sent to the clinical staff for review and that they should receive a call once it is complete. If they do not receive a call within 24 hours they can check with their pharmacy or our office.

## 2018-09-26 NOTE — Telephone Encounter (Signed)
Refill sent.

## 2018-09-27 DIAGNOSIS — E039 Hypothyroidism, unspecified: Secondary | ICD-10-CM | POA: Diagnosis not present

## 2018-09-27 DIAGNOSIS — E119 Type 2 diabetes mellitus without complications: Secondary | ICD-10-CM | POA: Diagnosis not present

## 2018-09-27 DIAGNOSIS — Z853 Personal history of malignant neoplasm of breast: Secondary | ICD-10-CM | POA: Diagnosis not present

## 2018-09-27 DIAGNOSIS — Z7982 Long term (current) use of aspirin: Secondary | ICD-10-CM | POA: Diagnosis not present

## 2018-09-27 DIAGNOSIS — E785 Hyperlipidemia, unspecified: Secondary | ICD-10-CM | POA: Diagnosis not present

## 2018-09-27 DIAGNOSIS — K5730 Diverticulosis of large intestine without perforation or abscess without bleeding: Secondary | ICD-10-CM | POA: Diagnosis not present

## 2018-09-27 DIAGNOSIS — I2510 Atherosclerotic heart disease of native coronary artery without angina pectoris: Secondary | ICD-10-CM | POA: Diagnosis not present

## 2018-09-27 DIAGNOSIS — Z8673 Personal history of transient ischemic attack (TIA), and cerebral infarction without residual deficits: Secondary | ICD-10-CM | POA: Diagnosis not present

## 2018-09-27 DIAGNOSIS — Z794 Long term (current) use of insulin: Secondary | ICD-10-CM | POA: Diagnosis not present

## 2018-09-27 DIAGNOSIS — I252 Old myocardial infarction: Secondary | ICD-10-CM | POA: Diagnosis not present

## 2018-09-27 DIAGNOSIS — E559 Vitamin D deficiency, unspecified: Secondary | ICD-10-CM | POA: Diagnosis not present

## 2018-09-27 DIAGNOSIS — I10 Essential (primary) hypertension: Secondary | ICD-10-CM | POA: Diagnosis not present

## 2018-09-27 DIAGNOSIS — Z9181 History of falling: Secondary | ICD-10-CM | POA: Diagnosis not present

## 2018-10-01 DIAGNOSIS — E119 Type 2 diabetes mellitus without complications: Secondary | ICD-10-CM | POA: Diagnosis not present

## 2018-10-01 DIAGNOSIS — I10 Essential (primary) hypertension: Secondary | ICD-10-CM | POA: Diagnosis not present

## 2018-10-01 DIAGNOSIS — E559 Vitamin D deficiency, unspecified: Secondary | ICD-10-CM | POA: Diagnosis not present

## 2018-10-01 DIAGNOSIS — I2510 Atherosclerotic heart disease of native coronary artery without angina pectoris: Secondary | ICD-10-CM | POA: Diagnosis not present

## 2018-10-01 DIAGNOSIS — E785 Hyperlipidemia, unspecified: Secondary | ICD-10-CM | POA: Diagnosis not present

## 2018-10-01 DIAGNOSIS — I252 Old myocardial infarction: Secondary | ICD-10-CM | POA: Diagnosis not present

## 2018-10-01 DIAGNOSIS — Z8673 Personal history of transient ischemic attack (TIA), and cerebral infarction without residual deficits: Secondary | ICD-10-CM | POA: Diagnosis not present

## 2018-10-01 DIAGNOSIS — K5730 Diverticulosis of large intestine without perforation or abscess without bleeding: Secondary | ICD-10-CM | POA: Diagnosis not present

## 2018-10-01 DIAGNOSIS — Z794 Long term (current) use of insulin: Secondary | ICD-10-CM | POA: Diagnosis not present

## 2018-10-01 DIAGNOSIS — Z853 Personal history of malignant neoplasm of breast: Secondary | ICD-10-CM | POA: Diagnosis not present

## 2018-10-01 DIAGNOSIS — Z7982 Long term (current) use of aspirin: Secondary | ICD-10-CM | POA: Diagnosis not present

## 2018-10-01 DIAGNOSIS — Z9181 History of falling: Secondary | ICD-10-CM | POA: Diagnosis not present

## 2018-10-01 DIAGNOSIS — E039 Hypothyroidism, unspecified: Secondary | ICD-10-CM | POA: Diagnosis not present

## 2018-10-02 DIAGNOSIS — Z8673 Personal history of transient ischemic attack (TIA), and cerebral infarction without residual deficits: Secondary | ICD-10-CM | POA: Diagnosis not present

## 2018-10-02 DIAGNOSIS — E785 Hyperlipidemia, unspecified: Secondary | ICD-10-CM | POA: Diagnosis not present

## 2018-10-02 DIAGNOSIS — E039 Hypothyroidism, unspecified: Secondary | ICD-10-CM | POA: Diagnosis not present

## 2018-10-02 DIAGNOSIS — Z794 Long term (current) use of insulin: Secondary | ICD-10-CM | POA: Diagnosis not present

## 2018-10-02 DIAGNOSIS — I2510 Atherosclerotic heart disease of native coronary artery without angina pectoris: Secondary | ICD-10-CM | POA: Diagnosis not present

## 2018-10-02 DIAGNOSIS — I252 Old myocardial infarction: Secondary | ICD-10-CM | POA: Diagnosis not present

## 2018-10-02 DIAGNOSIS — E119 Type 2 diabetes mellitus without complications: Secondary | ICD-10-CM | POA: Diagnosis not present

## 2018-10-02 DIAGNOSIS — K5730 Diverticulosis of large intestine without perforation or abscess without bleeding: Secondary | ICD-10-CM | POA: Diagnosis not present

## 2018-10-02 DIAGNOSIS — E559 Vitamin D deficiency, unspecified: Secondary | ICD-10-CM | POA: Diagnosis not present

## 2018-10-02 DIAGNOSIS — I10 Essential (primary) hypertension: Secondary | ICD-10-CM | POA: Diagnosis not present

## 2018-10-02 DIAGNOSIS — Z853 Personal history of malignant neoplasm of breast: Secondary | ICD-10-CM | POA: Diagnosis not present

## 2018-10-02 DIAGNOSIS — Z7982 Long term (current) use of aspirin: Secondary | ICD-10-CM | POA: Diagnosis not present

## 2018-10-02 DIAGNOSIS — Z9181 History of falling: Secondary | ICD-10-CM | POA: Diagnosis not present

## 2018-10-07 ENCOUNTER — Ambulatory Visit (INDEPENDENT_AMBULATORY_CARE_PROVIDER_SITE_OTHER): Payer: Medicare Other

## 2018-10-07 DIAGNOSIS — E559 Vitamin D deficiency, unspecified: Secondary | ICD-10-CM

## 2018-10-07 DIAGNOSIS — I10 Essential (primary) hypertension: Secondary | ICD-10-CM

## 2018-10-07 DIAGNOSIS — I252 Old myocardial infarction: Secondary | ICD-10-CM | POA: Diagnosis not present

## 2018-10-07 DIAGNOSIS — Z8673 Personal history of transient ischemic attack (TIA), and cerebral infarction without residual deficits: Secondary | ICD-10-CM

## 2018-10-07 DIAGNOSIS — R4182 Altered mental status, unspecified: Secondary | ICD-10-CM

## 2018-10-07 DIAGNOSIS — I2510 Atherosclerotic heart disease of native coronary artery without angina pectoris: Secondary | ICD-10-CM | POA: Diagnosis not present

## 2018-10-07 DIAGNOSIS — K5730 Diverticulosis of large intestine without perforation or abscess without bleeding: Secondary | ICD-10-CM

## 2018-10-07 DIAGNOSIS — Z853 Personal history of malignant neoplasm of breast: Secondary | ICD-10-CM

## 2018-10-07 DIAGNOSIS — Z794 Long term (current) use of insulin: Secondary | ICD-10-CM

## 2018-10-07 DIAGNOSIS — Z9181 History of falling: Secondary | ICD-10-CM

## 2018-10-07 DIAGNOSIS — E039 Hypothyroidism, unspecified: Secondary | ICD-10-CM

## 2018-10-07 DIAGNOSIS — E785 Hyperlipidemia, unspecified: Secondary | ICD-10-CM

## 2018-10-07 DIAGNOSIS — E119 Type 2 diabetes mellitus without complications: Secondary | ICD-10-CM

## 2018-10-07 DIAGNOSIS — Z7982 Long term (current) use of aspirin: Secondary | ICD-10-CM

## 2018-10-08 DIAGNOSIS — E559 Vitamin D deficiency, unspecified: Secondary | ICD-10-CM | POA: Diagnosis not present

## 2018-10-08 DIAGNOSIS — Z7982 Long term (current) use of aspirin: Secondary | ICD-10-CM | POA: Diagnosis not present

## 2018-10-08 DIAGNOSIS — I10 Essential (primary) hypertension: Secondary | ICD-10-CM | POA: Diagnosis not present

## 2018-10-08 DIAGNOSIS — K5730 Diverticulosis of large intestine without perforation or abscess without bleeding: Secondary | ICD-10-CM | POA: Diagnosis not present

## 2018-10-08 DIAGNOSIS — E039 Hypothyroidism, unspecified: Secondary | ICD-10-CM | POA: Diagnosis not present

## 2018-10-08 DIAGNOSIS — I252 Old myocardial infarction: Secondary | ICD-10-CM | POA: Diagnosis not present

## 2018-10-08 DIAGNOSIS — Z8673 Personal history of transient ischemic attack (TIA), and cerebral infarction without residual deficits: Secondary | ICD-10-CM | POA: Diagnosis not present

## 2018-10-08 DIAGNOSIS — E785 Hyperlipidemia, unspecified: Secondary | ICD-10-CM | POA: Diagnosis not present

## 2018-10-08 DIAGNOSIS — E119 Type 2 diabetes mellitus without complications: Secondary | ICD-10-CM | POA: Diagnosis not present

## 2018-10-08 DIAGNOSIS — I2510 Atherosclerotic heart disease of native coronary artery without angina pectoris: Secondary | ICD-10-CM | POA: Diagnosis not present

## 2018-10-08 DIAGNOSIS — Z853 Personal history of malignant neoplasm of breast: Secondary | ICD-10-CM | POA: Diagnosis not present

## 2018-10-08 DIAGNOSIS — Z794 Long term (current) use of insulin: Secondary | ICD-10-CM | POA: Diagnosis not present

## 2018-10-08 DIAGNOSIS — Z9181 History of falling: Secondary | ICD-10-CM | POA: Diagnosis not present

## 2018-10-15 DIAGNOSIS — Z8673 Personal history of transient ischemic attack (TIA), and cerebral infarction without residual deficits: Secondary | ICD-10-CM | POA: Diagnosis not present

## 2018-10-15 DIAGNOSIS — Z853 Personal history of malignant neoplasm of breast: Secondary | ICD-10-CM | POA: Diagnosis not present

## 2018-10-15 DIAGNOSIS — E559 Vitamin D deficiency, unspecified: Secondary | ICD-10-CM | POA: Diagnosis not present

## 2018-10-15 DIAGNOSIS — Z7982 Long term (current) use of aspirin: Secondary | ICD-10-CM | POA: Diagnosis not present

## 2018-10-15 DIAGNOSIS — I2510 Atherosclerotic heart disease of native coronary artery without angina pectoris: Secondary | ICD-10-CM | POA: Diagnosis not present

## 2018-10-15 DIAGNOSIS — E039 Hypothyroidism, unspecified: Secondary | ICD-10-CM | POA: Diagnosis not present

## 2018-10-15 DIAGNOSIS — I10 Essential (primary) hypertension: Secondary | ICD-10-CM | POA: Diagnosis not present

## 2018-10-15 DIAGNOSIS — E785 Hyperlipidemia, unspecified: Secondary | ICD-10-CM | POA: Diagnosis not present

## 2018-10-15 DIAGNOSIS — I252 Old myocardial infarction: Secondary | ICD-10-CM | POA: Diagnosis not present

## 2018-10-15 DIAGNOSIS — Z9181 History of falling: Secondary | ICD-10-CM | POA: Diagnosis not present

## 2018-10-15 DIAGNOSIS — E119 Type 2 diabetes mellitus without complications: Secondary | ICD-10-CM | POA: Diagnosis not present

## 2018-10-15 DIAGNOSIS — K5730 Diverticulosis of large intestine without perforation or abscess without bleeding: Secondary | ICD-10-CM | POA: Diagnosis not present

## 2018-10-15 DIAGNOSIS — Z794 Long term (current) use of insulin: Secondary | ICD-10-CM | POA: Diagnosis not present

## 2018-10-16 DIAGNOSIS — I10 Essential (primary) hypertension: Secondary | ICD-10-CM | POA: Diagnosis not present

## 2018-10-16 DIAGNOSIS — Z8673 Personal history of transient ischemic attack (TIA), and cerebral infarction without residual deficits: Secondary | ICD-10-CM | POA: Diagnosis not present

## 2018-10-16 DIAGNOSIS — E785 Hyperlipidemia, unspecified: Secondary | ICD-10-CM | POA: Diagnosis not present

## 2018-10-16 DIAGNOSIS — Z7982 Long term (current) use of aspirin: Secondary | ICD-10-CM | POA: Diagnosis not present

## 2018-10-16 DIAGNOSIS — K5730 Diverticulosis of large intestine without perforation or abscess without bleeding: Secondary | ICD-10-CM | POA: Diagnosis not present

## 2018-10-16 DIAGNOSIS — Z853 Personal history of malignant neoplasm of breast: Secondary | ICD-10-CM | POA: Diagnosis not present

## 2018-10-16 DIAGNOSIS — Z9181 History of falling: Secondary | ICD-10-CM | POA: Diagnosis not present

## 2018-10-16 DIAGNOSIS — Z794 Long term (current) use of insulin: Secondary | ICD-10-CM | POA: Diagnosis not present

## 2018-10-16 DIAGNOSIS — E559 Vitamin D deficiency, unspecified: Secondary | ICD-10-CM | POA: Diagnosis not present

## 2018-10-16 DIAGNOSIS — I252 Old myocardial infarction: Secondary | ICD-10-CM | POA: Diagnosis not present

## 2018-10-16 DIAGNOSIS — I2510 Atherosclerotic heart disease of native coronary artery without angina pectoris: Secondary | ICD-10-CM | POA: Diagnosis not present

## 2018-10-16 DIAGNOSIS — E119 Type 2 diabetes mellitus without complications: Secondary | ICD-10-CM | POA: Diagnosis not present

## 2018-10-16 DIAGNOSIS — E039 Hypothyroidism, unspecified: Secondary | ICD-10-CM | POA: Diagnosis not present

## 2018-10-17 DIAGNOSIS — E039 Hypothyroidism, unspecified: Secondary | ICD-10-CM | POA: Diagnosis not present

## 2018-10-17 DIAGNOSIS — Z9181 History of falling: Secondary | ICD-10-CM | POA: Diagnosis not present

## 2018-10-17 DIAGNOSIS — Z794 Long term (current) use of insulin: Secondary | ICD-10-CM | POA: Diagnosis not present

## 2018-10-17 DIAGNOSIS — I2510 Atherosclerotic heart disease of native coronary artery without angina pectoris: Secondary | ICD-10-CM | POA: Diagnosis not present

## 2018-10-17 DIAGNOSIS — Z8673 Personal history of transient ischemic attack (TIA), and cerebral infarction without residual deficits: Secondary | ICD-10-CM | POA: Diagnosis not present

## 2018-10-17 DIAGNOSIS — Z853 Personal history of malignant neoplasm of breast: Secondary | ICD-10-CM | POA: Diagnosis not present

## 2018-10-17 DIAGNOSIS — E785 Hyperlipidemia, unspecified: Secondary | ICD-10-CM | POA: Diagnosis not present

## 2018-10-17 DIAGNOSIS — E119 Type 2 diabetes mellitus without complications: Secondary | ICD-10-CM | POA: Diagnosis not present

## 2018-10-17 DIAGNOSIS — K5730 Diverticulosis of large intestine without perforation or abscess without bleeding: Secondary | ICD-10-CM | POA: Diagnosis not present

## 2018-10-17 DIAGNOSIS — I252 Old myocardial infarction: Secondary | ICD-10-CM | POA: Diagnosis not present

## 2018-10-17 DIAGNOSIS — E559 Vitamin D deficiency, unspecified: Secondary | ICD-10-CM | POA: Diagnosis not present

## 2018-10-17 DIAGNOSIS — Z7982 Long term (current) use of aspirin: Secondary | ICD-10-CM | POA: Diagnosis not present

## 2018-10-17 DIAGNOSIS — I10 Essential (primary) hypertension: Secondary | ICD-10-CM | POA: Diagnosis not present

## 2018-11-15 ENCOUNTER — Other Ambulatory Visit: Payer: Self-pay

## 2018-11-15 ENCOUNTER — Encounter

## 2018-11-15 ENCOUNTER — Encounter: Payer: Self-pay | Admitting: Neurology

## 2018-11-15 ENCOUNTER — Ambulatory Visit: Payer: Medicare Other | Admitting: Neurology

## 2018-11-15 VITALS — BP 128/62 | HR 63 | Ht 62.0 in | Wt 139.0 lb

## 2018-11-15 DIAGNOSIS — G3184 Mild cognitive impairment, so stated: Secondary | ICD-10-CM | POA: Diagnosis not present

## 2018-11-15 NOTE — Progress Notes (Signed)
NEUROLOGY CONSULTATION NOTE  Brianna Weber MRN: [id number] DOB: [birth year]  Referring provider: Dr. [name] Primary care provider: Dr. [name]  Reason for consult:  Memory loss  Dear Dr Livia Snellen:  Thank you for your kind referral of Brianna Weber for consultation of the above symptoms. Although her history is well known to you, please allow me to reiterate it for the purpose of our medical record. The patient was accompanied to the clinic by her daughter [name] who also provides collateral information. Records and images were personally reviewed where available.  HISTORY OF PRESENT ILLNESS: This is a pleasant 73 year old right-handed woman with a history of hypertension, diabetes, breast cancer s/p mastectomy, presenting for evaluation of memory loss. Memory concerns were raised when she was admitted for altered mental status last 08/26/18. Records from her hospitalization were reviewed, she recalled speaking to family on the phone then waking up to EMS and family around her. Glucose level was 51. She was treated for hypoglycemia and noted to be alert, oriented and conversant the next day. She was unable to tell physicians what times of the day she takes her insulin or what dose, then it is noted that she revealed she had been adjusting her insulin dose at home on her own without notifying her PCP. Because of her poor memory, she was not able to tell MD how many units she was administering, but she reported that the low CBG readings caused her to reduce her insulin dosing. Dosing was changed in the hospital and patient was referred for Neurological evaluation of memory loss.   She and her daughter [name] both report today that she was taking her insulin as prescribed and she was not missing any doses or changing it, they state "the doctor had her on too much insulin and the dose has been changed." She and [name] report she is very organized with how she takes her  medications and writes things down. [name] did report that she would not snack before bedtime, which made her sugar levels go down, but she is better with meals now. She feels her memory is good. [name] started noticing minor memory issues for the past 10 years, worse the past 2 years. Her husband passed away in 08/18/2018, then she lost her 66 year old cat, then had to move in with her sister 3 months ago. She recognizes that this was a lot, "I know it's depression." She denies missing bill payments or leaving the stove on. Family was uncomfortable with her driving after the incident in October, she stopped driving when she moved in with her sister, denied getting lost previously, but states that if she drove now she may forget that she lives with her sister and drive to their old home. [name] states she repeats herself several times. [name] also notes increased moodiness and irritability, no paranoia or hallucinations. [name] reports she was taking Benadryl BID and was sleeping a lot, this has improved with discontinuation of Benadryl. Sleep now is pretty good, no wandering behavior.She is independent with dressing and bathing.  She denies any headaches, dizziness, diplopia, dysarthria/dysphagia, neck/back pain, focal numbness/tingling/weakness, bowel dysfunction, anosmia or tremors. She denies any urinary incontinence but wears pads. She denies any family history of dementia, no alcohol use. She recalls a bowl hitting her head at work several years ago, no significant head injuries.   I personally reviewed MRI brain with and without contrast done 09/2018 which did not show any acute changes. There was  mild diffuse volume loss, moderate chronic microvascular disease.  Laboratory Data: Lab Results  Component Value Date   TSH 4.787 (H) 08/27/2018   Lab Results  Component Value Date   [id number] 696 08/27/2018    PAST MEDICAL HISTORY: Past Medical History:  Diagnosis Date  . Allergy   .  Arrhythmia    bradycardia  . Breast cancer [facility])    s/p left mastectomy  . Diabetes mellitus   . Diverticulosis of colon   . Hyperlipidemia   . Hypertension   . 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 Normal LV function by repeatr echo.  . Peripheral edema   . Thyroid disease    hypothyroidism  . Vitamin D deficiency     PAST SURGICAL HISTORY: Past Surgical History:  Procedure Laterality Date  . ABDOMINAL HYSTERECTOMY    . CHOLECYSTECTOMY    . MASTECTOMY     left    MEDICATIONS: Current Outpatient Medications on File Prior to Visit  Medication Sig Dispense Refill  . amLODipine (NORVASC) 10 MG tablet Take 1 tablet (10 mg total) by mouth daily. 90 tablet 0  . aspirin 81 MG tablet Take 81 mg by mouth daily.      . benazepril (LOTENSIN) 20 MG tablet Take 1 tablet (20 mg total) by mouth daily. 90 tablet 0  . fexofenadine (ALLEGRA) 180 MG tablet Take 180 mg by mouth daily.      . furosemide (LASIX) 40 MG tablet Take 1 tablet (40 mg total) by mouth daily. 90 tablet 0  . glucose blood (ONE TOUCH ULTRA TEST) test strip Test bid. 200 each 3  . insulin NPH-regular Human (NOVOLIN 70/30) (70-30) 100 UNIT/ML injection 25 units in AM and 25 units with supper 10 mL 0  . isosorbide mononitrate (IMDUR) 60 MG 24 hr tablet Take 1 tablet (60 mg total) by mouth daily. 90 tablet 0  . levothyroxine (SYNTHROID, LEVOTHROID) 125 MCG tablet Take 1 tablet (125 mcg total) by mouth daily before breakfast. 30 tablet 3  . nitrofurantoin, macrocrystal-monohydrate, (MACROBID) 100 MG capsule Take 1 capsule (100 mg total) by mouth 2 (two) times daily. 14 capsule 0  . simvastatin (ZOCOR) 10 MG tablet Take 1 tablet (10 mg total) by mouth daily. 90 tablet 1  . vitamin B-12 (CYANOCOBALAMIN) 500 MCG tablet Take 1 tablet (500 mcg total) by mouth daily. 30 tablet 0  . vitamin D, CHOLECALCIFEROL, 400 UNITS tablet Take 400 Units by mouth daily.       No  current facility-administered medications on file prior to visit.     ALLERGIES: No Known Allergies  FAMILY HISTORY: Family History  Problem Relation Age of Onset  . Hypertension Mother   . Heart disease Mother   . Heart attack Father   . Heart disease Sister   . Heart disease Brother   . Cancer Maternal Aunt        breast   . Heart disease Maternal Aunt   . Hyperlipidemia Maternal Aunt   . Hypertension Maternal Aunt   . Heart disease Maternal Uncle   . Hyperlipidemia Maternal Uncle   . Hypertension Maternal Uncle   . Heart disease Maternal Grandmother   . Heart disease Sister   . Heart disease Sister   . Heart disease Brother   . Diabetes Daughter   . Hypertension Unknown     SOCIAL HISTORY: Social History   Socioeconomic History  . Marital status: Married    Spouse  name: Not on file  . Number of children: Not on file  . Years of education: Not on file  . Highest education level: Not on file  Occupational History  . Not on file  Social Needs  . Financial resource strain: Not on file  . Food insecurity:    Worry: Not on file    Inability: Not on file  . Transportation needs:    Medical: Not on file    Non-medical: Not on file  Tobacco Use  . Smoking status: Never Smoker  . Smokeless tobacco: Never Used  Substance and Sexual Activity  . Alcohol use: No  . Drug use: No  . Sexual activity: Not Currently    Birth control/protection: Surgical  Lifestyle  . Physical activity:    Days per week: Not on file    Minutes per session: Not on file  . Stress: Not on file  Relationships  . Social connections:    Talks on phone: Not on file    Gets together: Not on file    Attends religious service: Not on file    Active member of club or organization: Not on file    Attends meetings of clubs or organizations: Not on file    Relationship status: Not on file  . Intimate partner violence:    Fear of current or ex partner: Not on file    Emotionally abused: Not on  file    Physically abused: Not on file    Forced sexual activity: Not on file  Other Topics Concern  . Not on file  Social History Narrative  . Not on file    REVIEW OF SYSTEMS: Constitutional: No fevers, chills, or sweats, no generalized fatigue, change in appetite Eyes: No visual changes, double vision, eye pain Ear, nose and throat: No hearing loss, ear pain, nasal congestion, sore throat Cardiovascular: No chest pain, palpitations Respiratory:  No shortness of breath at rest or with exertion, wheezes GastrointestinaI: No nausea, vomiting, diarrhea, abdominal pain, fecal incontinence Genitourinary:  No dysuria, urinary retention or frequency Musculoskeletal:  No neck pain, back pain Integumentary: No rash, pruritus, skin lesions Neurological: as above Psychiatric: + depression,no insomnia, anxiety Endocrine: No palpitations, fatigue, diaphoresis, mood swings, change in appetite, change in weight, increased thirst Hematologic/Lymphatic:  No anemia, purpura, petechiae. Allergic/Immunologic: no itchy/runny eyes, nasal congestion, recent allergic reactions, rashes  PHYSICAL EXAM: Vitals:   11/15/18 0909  BP: 128/62  Pulse: 63  SpO2: 98%   General: No acute distress Head:  Normocephalic/atraumatic Eyes: Fundoscopic exam shows bilateral sharp discs, no vessel changes, exudates, or hemorrhages Neck: supple, no paraspinal tenderness, full range of motion Back: No paraspinal tenderness Heart: regular rate and rhythm Lungs: Clear to auscultation bilaterally. Vascular: No carotid bruits. Skin/Extremities: No rash, no edema Neurological Exam: Mental status: alert and oriented to person, place, and time, no dysarthria or aphasia, Fund of knowledge is appropriate.  Recent and remote memory are impaired.  Attention and concentration are normal.    Able to name objects and repeat phrases. CDT 4/5, she overall did well except wrote numbers up to 15 around the clock (correct clock hands at  10 past 11). MMSE - Mini Mental State Exam 11/15/2018 10/02/2016 10/01/2015  Orientation to time 4 5 4   Orientation to Place 5 5 5   Registration 3 3 3   Attention/ Calculation 4 5 4   Recall 0 0 2  Language- name 2 objects 2 2 2   Language- repeat 1 1 1   Language- follow 3  step command 3 3 3   Language- read & follow direction 1 1 1   Write a sentence 1 1 1   Copy design 1 1 1   Total score 25 27 27    Cranial nerves: CN I: not tested CN II: pupils equal, round and reactive to light, visual fields intact, fundi unremarkable. CN III, IV, VI:  full range of motion, no nystagmus, no ptosis CN V: facial sensation intact CN VII: upper and lower face symmetric CN VIII: hearing intact to finger rub CN IX, X: gag intact, uvula midline CN XI: sternocleidomastoid and trapezius muscles intact CN XII: tongue midline Bulk & Tone: normal, no fasciculations. Motor: 5/5 throughout with no pronator drift. Sensation: intact to light touch, cold, pin, vibration and joint position sense.  No extinction to double simultaneous stimulation.  Romberg test negative Deep Tendon Reflexes: +2 throughout, no ankle clonus Plantar responses: downgoing bilaterally Cerebellar: no incoordination on finger to nose, heel to shin. No dysdiadochokinesia Gait: narrow-based and steady, able to tandem walk adequately. Tremor: none  IMPRESSION: This is a pleasant 73 year old right-handed woman with a history of hypertension, diabetes, breast cancer s/p mastectomy, presenting for evaluation of memory loss. She was admitted with altered mental status last October 2019 in the setting of hypoglycemia. There was concern she was not taking insulin as prescribed, however she and her daughter today report she was taking medications as instructed but was on a higher dose. She and daughter deny any difficulties managing medications and finances independently. Main concern is repetition and some moodiness. Neurological exam normal, MMSE today  25/30. MRI brain did not show any acute changes. Symptoms suggestive of mild cognitive impairment, possibly early dementia, continue to monitor. I discussed family checking behind to ensure she is compliant with medications. She is not driving. We discussed different causes of memory loss, her B12 level was low, start daily B12 54mcg supplement. Continue to monitor mood, depression may be contributing to cognition as well, she declines SSRI today. We discussed the importance of control of vascular risk factors, physical exercise, and brain stimulation exercises for brain health. Follow-up in 6 months, they know to call for any changes.   Thank you for allowing me to participate in the care of this patient. Please do not hesitate to call for any questions or concerns.   Ellouise Newer, M.D.  CC: Dr. Livia Snellen

## 2018-11-15 NOTE — Patient Instructions (Signed)
1. Start a daily vitamin B12 518mcg supplement 2. Continue to monitor mood, depression and stress can worsen memory 3. Follow-up in 6 months or so, call for any changes  FALL PRECAUTIONS: Be cautious when walking. Scan the area for obstacles that may increase the risk of trips and falls. When getting up in the mornings, sit up at the edge of the bed for a few minutes before getting out of bed. Consider elevating the bed at the head end to avoid drop of blood pressure when getting up. Walk always in a well-lit room (use night lights in the walls). Avoid area rugs or power cords from appliances in the middle of the walkways. Use a walker or a cane if necessary and consider physical therapy for balance exercise. Get your eyesight checked regularly.  FINANCIAL OVERSIGHT: Supervision, especially oversight when making financial decisions or transactions is also recommended.  HOME SAFETY: Consider the safety of the kitchen when operating appliances like stoves, microwave oven, and blender. Consider having supervision and share cooking responsibilities until no longer able to participate in those. Accidents with firearms and other hazards in the house should be identified and addressed as well.  DRIVING: Regarding driving, in patients with progressive memory problems, driving will be impaired. We advise to have someone else do the driving if trouble finding directions or if minor accidents are reported. Independent driving assessment is available to determine safety of driving.  ABILITY TO BE LEFT ALONE: If patient is unable to contact 911 operator, consider using LifeLine, or when the need is there, arrange for someone to stay with patients. Smoking is a fire hazard, consider supervision or cessation. Risk of wandering should be assessed by caregiver and if detected at any point, supervision and safe proof recommendations should be instituted.  MEDICATION SUPERVISION: Inability to self-administer medication  needs to be constantly addressed. Implement a mechanism to ensure safe administration of the medications.  RECOMMENDATIONS FOR ALL PATIENTS WITH MEMORY PROBLEMS: 1. Continue to exercise (Recommend 30 minutes of walking everyday, or 3 hours every week) 2. Increase social interactions - continue going to [place] and enjoy social gatherings with friends and family 3. Eat healthy, avoid fried foods and eat more fruits and vegetables 4. Maintain adequate blood pressure, blood sugar, and blood cholesterol level. Reducing the risk of stroke and cardiovascular disease also helps promoting better memory. 5. Avoid stressful situations. Live a simple life and avoid aggravations. Organize your time and prepare for the next day in anticipation. 6. Sleep well, avoid any interruptions of sleep and avoid any distractions in the bedroom that may interfere with adequate sleep quality 7. Avoid sugar, avoid sweets as there is a strong link between excessive sugar intake, diabetes, and cognitive impairment We discussed the Mediterranean diet, which has been shown to help patients reduce the risk of progressive memory disorders and reduces cardiovascular risk. This includes eating fish, eat fruits and green leafy vegetables, nuts like almonds and hazelnuts, walnuts, and also use olive oil. Avoid fast foods and fried foods as much as possible. Avoid sweets and sugar as sugar use has been linked to worsening of memory function.  There is always a concern of gradual progression of memory problems. If this is the case, then we may need to adjust level of care according to patient needs. Support, both to the patient and caregiver, should then be put into place.

## 2018-12-18 ENCOUNTER — Ambulatory Visit: Payer: Medicare Other | Admitting: Family Medicine

## 2018-12-19 ENCOUNTER — Other Ambulatory Visit: Payer: Self-pay | Admitting: Family Medicine

## 2018-12-20 ENCOUNTER — Other Ambulatory Visit: Payer: Self-pay | Admitting: Family Medicine

## 2018-12-20 MED ORDER — SIMVASTATIN 10 MG PO TABS: 10.0000 mg | ORAL_TABLET | Freq: Every day | ORAL | 1 refills | Status: DC

## 2018-12-23 ENCOUNTER — Other Ambulatory Visit: Payer: Self-pay | Admitting: Family Medicine

## 2018-12-23 NOTE — Telephone Encounter (Signed)
Patient aware she will need to be seen.  Declined making appt at this time

## 2019-01-27 ENCOUNTER — Encounter: Payer: Self-pay | Admitting: Family Medicine

## 2019-01-27 ENCOUNTER — Ambulatory Visit: Payer: Medicare Other | Admitting: Family Medicine

## 2019-01-27 ENCOUNTER — Other Ambulatory Visit: Payer: Self-pay

## 2019-01-27 ENCOUNTER — Ambulatory Visit (INDEPENDENT_AMBULATORY_CARE_PROVIDER_SITE_OTHER): Payer: Medicare Other | Admitting: Family Medicine

## 2019-01-27 VITALS — BP 111/67 | HR 68 | Temp 97.0°F | Ht 62.0 in | Wt 136.4 lb

## 2019-01-27 DIAGNOSIS — E039 Hypothyroidism, unspecified: Secondary | ICD-10-CM | POA: Diagnosis not present

## 2019-01-27 DIAGNOSIS — E782 Mixed hyperlipidemia: Secondary | ICD-10-CM | POA: Diagnosis not present

## 2019-01-27 DIAGNOSIS — I10 Essential (primary) hypertension: Secondary | ICD-10-CM | POA: Diagnosis not present

## 2019-01-27 DIAGNOSIS — Z794 Long term (current) use of insulin: Secondary | ICD-10-CM

## 2019-01-27 DIAGNOSIS — E119 Type 2 diabetes mellitus without complications: Secondary | ICD-10-CM

## 2019-01-27 LAB — BAYER DCA HB A1C WAIVED: HB A1C (BAYER DCA - WAIVED): 6.5 % (ref <7.0)

## 2019-01-27 MED ORDER — BENAZEPRIL HCL 20 MG PO TABS: 20.0000 mg | ORAL_TABLET | Freq: Every day | ORAL | 1 refills | Status: DC

## 2019-01-27 MED ORDER — ISOSORBIDE MONONITRATE ER 60 MG PO TB24: 60.0000 mg | ORAL_TABLET | Freq: Every day | ORAL | 1 refills | Status: DC

## 2019-01-27 MED ORDER — AMLODIPINE BESYLATE 10 MG PO TABS: 10.0000 mg | ORAL_TABLET | Freq: Every day | ORAL | 1 refills | Status: DC

## 2019-01-27 MED ORDER — FUROSEMIDE 40 MG PO TABS: ORAL_TABLET | ORAL | 1 refills | Status: DC

## 2019-01-27 NOTE — Progress Notes (Signed)
Subjective:  Patient ID: Brianna Weber,  female    DOB: 1946/04/16  Age: 73 y.o.    CC: Medical Management of Chronic Issues   HPI Brianna Weber presents for  follow-up of hypertension. Patient has no history of headache chest pain or shortness of breath or recent cough. Patient also denies symptoms of TIA such as numbness weakness lateralizing. Patient denies side effects from medication. States taking it regularly.  Patient also  in for follow-up of elevated cholesterol. Doing well without complaints on current medication. Denies side effects  including myalgia and arthralgia and nausea. Also in today for liver function testing. Currently no chest pain, shortness of breath or other cardiovascular related symptoms noted.  Follow-up of diabetes. Patient does check blood sugar at home. Readings on monitor running 150-200. (Dates are incorrect.) Patient denies symptoms such as excessive hunger or urinary frequency, excessive hunger, nausea No significant hypoglycemic spells noted. Medications reviewed. Pt reports taking them regularly. Pt. denies complication/adverse reaction today.    History Brianna Weber has a past medical history of Allergy, Arrhythmia, Breast cancer ([place]), Diabetes mellitus, Diverticulosis of colon, Hyperlipidemia, Hypertension, NSTEMI (non-ST elevated myocardial infarction) ([place]), Peripheral edema, Thyroid disease, and Vitamin D deficiency.   She has a past surgical history that includes Mastectomy; Cholecystectomy; and Abdominal hysterectomy.   Her family history includes Cancer in her maternal aunt; Diabetes in her daughter; Heart attack in her father; Heart disease in her brother, brother, maternal aunt, maternal grandmother, maternal uncle, mother, sister, sister, and sister; Hyperlipidemia in her maternal aunt and maternal uncle; Hypertension in her maternal aunt, maternal uncle, mother, and another family member.She reports that she has never smoked. She  has never used smokeless tobacco. She reports that she does not drink alcohol or use drugs.  Current Outpatient Medications on File Prior to Visit  Medication Sig Dispense Refill  . aspirin 81 MG tablet Take 81 mg by mouth daily.      Marland Kitchen glucose blood (ONE TOUCH ULTRA TEST) test strip Test bid. 200 each 3  . insulin NPH-regular Human (NOVOLIN 70/30) (70-30) 100 UNIT/ML injection 25 units in AM and 25 units with supper 10 mL 0  . levothyroxine (SYNTHROID, LEVOTHROID) 125 MCG tablet Take 1 tablet (125 mcg total) by mouth daily before breakfast. 30 tablet 3  . simvastatin (ZOCOR) 10 MG tablet Take 1 tablet (10 mg total) by mouth daily. 90 tablet 1   No current facility-administered medications on file prior to visit.     ROS Review of Systems  Constitutional: Negative.   HENT: Negative for congestion.   Eyes: Negative for visual disturbance.  Respiratory: Negative for shortness of breath.   Cardiovascular: Negative for chest pain.  Gastrointestinal: Negative for abdominal pain, constipation, diarrhea, nausea and vomiting.  Genitourinary: Negative for difficulty urinating.  Musculoskeletal: Negative for arthralgias and myalgias.  Neurological: Negative for headaches.  Psychiatric/Behavioral: Negative for sleep disturbance.    Objective:  BP 111/67   Pulse 68   Temp (!) 97 F (36.1 C) (Oral)   Ht '5\' 2"'$  (1.575 m)   Wt 136 lb 6 oz (61.9 kg)   BMI 24.94 kg/m   BP Readings from Last 3 Encounters:  01/27/19 111/67  11/15/18 128/62  09/16/18 (!) 124/55    Wt Readings from Last 3 Encounters:  01/27/19 136 lb 6 oz (61.9 kg)  11/15/18 139 lb (63 kg)  09/16/18 145 lb (65.8 kg)     Physical Exam Constitutional:      General: She  is not in acute distress.    Appearance: She is well-developed.  HENT:     Head: Normocephalic and atraumatic.     Right Ear: External ear normal.     Left Ear: External ear normal.     Nose: Nose normal.  Eyes:     Conjunctiva/sclera: Conjunctivae  normal.     Pupils: Pupils are equal, round, and reactive to light.  Neck:     Musculoskeletal: Normal range of motion and neck supple.     Thyroid: No thyromegaly.  Cardiovascular:     Rate and Rhythm: Normal rate and regular rhythm.     Heart sounds: Normal heart sounds. No murmur.  Pulmonary:     Effort: Pulmonary effort is normal. No respiratory distress.     Breath sounds: Normal breath sounds. No wheezing or rales.  Abdominal:     General: Bowel sounds are normal. There is no distension.     Palpations: Abdomen is soft.     Tenderness: There is no abdominal tenderness.  Lymphadenopathy:     Cervical: No cervical adenopathy.  Skin:    General: Skin is warm and dry.  Neurological:     Mental Status: She is alert and oriented to person, place, and time.     Deep Tendon Reflexes: Reflexes are normal and symmetric.  Psychiatric:        Behavior: Behavior normal.        Thought Content: Thought content normal.        Judgment: Judgment normal.     Assessment & Plan:   Brianna Weber was seen today for medical management of chronic issues.  Diagnoses and all orders for this visit:  Mixed hyperlipidemia -     CBC with Differential/Platelet -     CMP14+EGFR -     Lipid panel  Type 2 diabetes mellitus without complication, with long-term current use of insulin (HCC) -     Microalbumin / creatinine urine ratio -     Bayer DCA Hb A1c Waived -     Urinalysis  Essential hypertension  Hypothyroidism, unspecified type -     TSH -     T4, Free  Other orders -     amLODipine (NORVASC) 10 MG tablet; Take 1 tablet (10 mg total) by mouth daily. -     benazepril (LOTENSIN) 20 MG tablet; Take 1 tablet (20 mg total) by mouth daily. -     furosemide (LASIX) 40 MG tablet; TAKE 1 TABLET DAILY -     isosorbide mononitrate (IMDUR) 60 MG 24 hr tablet; Take 1 tablet (60 mg total) by mouth daily.   I have discontinued [name]. Altic's fexofenadine, vitamin D (CHOLECALCIFEROL),  vitamin B-12, and nitrofurantoin (macrocrystal-monohydrate). I have also changed her benazepril. Additionally, I am having her maintain her aspirin, insulin NPH-regular Human, glucose blood, levothyroxine, simvastatin, amLODipine, furosemide, and isosorbide mononitrate.  Meds ordered this encounter  Medications  . amLODipine (NORVASC) 10 MG tablet    Sig: Take 1 tablet (10 mg total) by mouth daily.    Dispense:  90 tablet    Refill:  1  . benazepril (LOTENSIN) 20 MG tablet    Sig: Take 1 tablet (20 mg total) by mouth daily.    Dispense:  90 tablet    Refill:  1  . furosemide (LASIX) 40 MG tablet    Sig: TAKE 1 TABLET DAILY    Dispense:  90 tablet    Refill:  1  . isosorbide mononitrate (IMDUR) 60  MG 24 hr tablet    Sig: Take 1 tablet (60 mg total) by mouth daily.    Dispense:  90 tablet    Refill:  1     Follow-up: Return in about 3 months (around 04/29/2019).  [name], M.D.

## 2019-01-28 ENCOUNTER — Other Ambulatory Visit: Payer: Self-pay | Admitting: Family Medicine

## 2019-01-28 LAB — CBC WITH DIFFERENTIAL/PLATELET
BASOS ABS: 0.1 10*3/uL (ref 0.0–0.2)
BASOS: 1 %
EOS (ABSOLUTE): 0.3 10*3/uL (ref 0.0–0.4)
Eos: 4 %
HEMOGLOBIN: 12.1 g/dL (ref 11.1–15.9)
Hematocrit: 37 % (ref 34.0–46.6)
IMMATURE GRANS (ABS): 0.1 10*3/uL (ref 0.0–0.1)
Immature Granulocytes: 1 %
LYMPHS ABS: 1.9 10*3/uL (ref 0.7–3.1)
LYMPHS: 23 %
MCH: 27.9 pg (ref 26.6–33.0)
MCHC: 32.7 g/dL (ref 31.5–35.7)
MCV: 86 fL (ref 79–97)
Monocytes Absolute: 0.6 10*3/uL (ref 0.1–0.9)
Monocytes: 7 %
NEUTROS ABS: 5.5 10*3/uL (ref 1.4–7.0)
Neutrophils: 64 %
PLATELETS: 365 10*3/uL (ref 150–450)
RBC: 4.33 x10E6/uL (ref 3.77–5.28)
RDW: 12.7 % (ref 11.7–15.4)
WBC: 8.5 10*3/uL (ref 3.4–10.8)

## 2019-01-28 LAB — CMP14+EGFR
A/G RATIO: 1.6 (ref 1.2–2.2)
ALBUMIN: 4.4 g/dL (ref 3.8–4.8)
ALK PHOS: 111 IU/L (ref 39–117)
ALT: 12 IU/L (ref 0–32)
AST: 17 IU/L (ref 0–40)
BILIRUBIN TOTAL: 0.2 mg/dL (ref 0.0–1.2)
BUN/Creatinine Ratio: 14 (ref 12–28)
BUN: 17 mg/dL (ref 8–27)
CHLORIDE: 100 mmol/L (ref 96–106)
CO2: 25 mmol/L (ref 20–29)
Calcium: 9.9 mg/dL (ref 8.7–10.3)
Creatinine, Ser: 1.25 mg/dL — ABNORMAL HIGH (ref 0.57–1.00)
GFR calc non Af Amer: 44 mL/min/{1.73_m2} — ABNORMAL LOW (ref >59)
GFR, EST AFRICAN AMERICAN: 50 mL/min/{1.73_m2} — AB (ref >59)
GLUCOSE: 174 mg/dL — AB (ref 65–99)
Globulin, Total: 2.8 g/dL (ref 1.5–4.5)
POTASSIUM: 4.5 mmol/L (ref 3.5–5.2)
SODIUM: 143 mmol/L (ref 134–144)
Total Protein: 7.2 g/dL (ref 6.0–8.5)

## 2019-01-28 LAB — TSH: TSH: 7.37 u[IU]/mL — AB (ref 0.450–4.500)

## 2019-01-28 LAB — LIPID PANEL
CHOLESTEROL TOTAL: 188 mg/dL (ref 100–199)
Chol/HDL Ratio: 2.8 ratio (ref 0.0–4.4)
HDL: 68 mg/dL (ref >39)
LDL Calculated: 80 mg/dL (ref 0–99)
Triglycerides: 200 mg/dL — ABNORMAL HIGH (ref 0–149)
VLDL Cholesterol Cal: 40 mg/dL (ref 5–40)

## 2019-01-28 LAB — T4, FREE: Free T4: 1.29 ng/dL (ref 0.82–1.77)

## 2019-01-28 MED ORDER — LEVOTHYROXINE SODIUM 137 MCG PO TABS: 125.0000 ug | ORAL_TABLET | Freq: Every day | ORAL | 2 refills | Status: DC

## 2019-01-29 ENCOUNTER — Other Ambulatory Visit: Payer: Self-pay | Admitting: Family Medicine

## 2019-01-29 ENCOUNTER — Other Ambulatory Visit: Payer: Self-pay

## 2019-01-29 ENCOUNTER — Telehealth: Payer: Self-pay | Admitting: Family Medicine

## 2019-01-29 ENCOUNTER — Encounter: Payer: Medicare Other | Admitting: *Deleted

## 2019-01-29 DIAGNOSIS — E039 Hypothyroidism, unspecified: Secondary | ICD-10-CM

## 2019-01-29 NOTE — Telephone Encounter (Signed)
Please contact the patient her kidney function is 40%. Diabetic pills become eith er unsafe or ineffective if it is below 50-60% However, she could try cutting her insulin back to 15 units twice a day. That should save her some money. One vial a month should work for that dose.

## 2019-01-29 NOTE — Telephone Encounter (Signed)
Pt is currently on insulin NPH-regular Human (NOVOLIN 70/30) (70-30) 100 UNIT/ML injection and pt is wanting to know if her blood work is well enough were she can be on something that is cheaper or go back on the pill she was taking, since her husband passed its just her income, she would like it sent to [facility] so they can deliever and she doesn't have to go out.    Pharmacy: [facility]

## 2019-01-29 NOTE — Telephone Encounter (Signed)
Patient aware.

## 2019-02-03 ENCOUNTER — Other Ambulatory Visit: Payer: Self-pay | Admitting: Family Medicine

## 2019-03-04 ENCOUNTER — Other Ambulatory Visit: Payer: Self-pay | Admitting: Family Medicine

## 2019-03-05 ENCOUNTER — Telehealth: Payer: Self-pay | Admitting: Family Medicine

## 2019-03-21 ENCOUNTER — Other Ambulatory Visit: Payer: Self-pay | Admitting: Family Medicine

## 2019-03-31 ENCOUNTER — Ambulatory Visit: Payer: Medicare Other | Admitting: Family Medicine

## 2019-04-30 ENCOUNTER — Ambulatory Visit: Payer: Medicare Other | Admitting: Family Medicine

## 2019-05-28 ENCOUNTER — Other Ambulatory Visit: Payer: Self-pay | Admitting: Family Medicine

## 2019-06-04 ENCOUNTER — Encounter: Payer: Self-pay | Admitting: Neurology

## 2019-06-04 ENCOUNTER — Other Ambulatory Visit: Payer: Self-pay

## 2019-06-04 ENCOUNTER — Telehealth (INDEPENDENT_AMBULATORY_CARE_PROVIDER_SITE_OTHER): Payer: Medicare Other | Admitting: Neurology

## 2019-06-04 VITALS — Ht 62.5 in | Wt 133.4 lb

## 2019-06-04 DIAGNOSIS — G3184 Mild cognitive impairment, so stated: Secondary | ICD-10-CM

## 2019-06-04 NOTE — Progress Notes (Signed)
Virtual Visit via Telephone Note The purpose of this virtual visit is to provide medical care while limiting exposure to the novel coronavirus.    Consent was obtained for phone visit:  Yes.   Answered questions that patient had about telehealth interaction:  Yes.   I discussed the limitations, risks, security and privacy concerns of performing an evaluation and management service by telephone. I also discussed with the patient that there may be a patient responsible charge related to this service. The patient expressed understanding and agreed to proceed.  Pt location: Home Physician Location: office Name of referring provider:  [name], MD I connected with .Brianna Weber at patients initiation/request on 06/04/2019 at  2:30 PM EDT by telephone and verified that I am speaking with the correct person using two identifiers.  Pt MRN:  [id number] Pt DOB:  26-May-1946   History of Present Illness:  The patient had a phone visit on 06/04/2019 for memory loss. She was last seen 6 months ago, MMSE 25/30 in January 2020. She is alone for today's visit. She repeats herself several times during the visit that she does all things independently/by herself. She states "I read, crochet, pay all my bills, I do everything by myself." She denies getting lost driving. She denies missing medications. She reports her husband passed away 6 months ago and her son was upset because she went back home when they wanted to sell the house. When I asked for permission to speak to her daughter, she repeatedly stated that she only talks to her daughter once a month and did not want me to speak with her. She denies any headaches, dizziness, vision changes, focal numbness/tingling/weakness, no falls.   Lab Results  Component Value Date   HGBA1C 6.5 01/27/2019    History on Initial Assessment 11/15/2018: This is a pleasant 73 year old right-handed woman with a history of hypertension, diabetes, breast cancer s/p  mastectomy, presenting for evaluation of memory loss. Memory concerns were raised when she was admitted for altered mental status last 08/26/18. Records from her hospitalization were reviewed, she recalled speaking to family on the phone then waking up to EMS and family around her. Glucose level was 51. She was treated for hypoglycemia and noted to be alert, oriented and conversant the next day. She was unable to tell physicians what times of the day she takes her insulin or what dose, then it is noted that she revealed she had been adjusting her insulin dose at home on her own without notifying her PCP. Because of her poor memory, she was not able to tell MD how many units she was administering, but she reported that the low CBG readings caused her to reduce her insulin dosing. Dosing was changed in the hospital and patient was referred for Neurological evaluation of memory loss.   She and her daughter [name] both report today that she was taking her insulin as prescribed and she was not missing any doses or changing it, they state "the doctor had her on too much insulin and the dose has been changed." She and [name] report she is very organized with how she takes her medications and writes things down. [name] did report that she would not snack before bedtime, which made her sugar levels go down, but she is better with meals now. She feels her memory is good. [name] started noticing minor memory issues for the past 10 years, worse the past 2 years. Her husband passed away in 2023-08-16  2019, then she lost her 43 year old cat, then had to move in with her sister 3 months ago. She recognizes that this was a lot, "I know it's depression." She denies missing bill payments or leaving the stove on. Family was uncomfortable with her driving after the incident in October, she stopped driving when she moved in with her sister, denied getting lost previously, but states that if she drove now she may forget that she lives  with her sister and drive to their old home. [name] states she repeats herself several times. [name] also notes increased moodiness and irritability, no paranoia or hallucinations. [name] reports she was taking Benadryl BID and was sleeping a lot, this has improved with discontinuation of Benadryl. Sleep now is pretty good, no wandering behavior.She is independent with dressing and bathing.  She denies any headaches, dizziness, diplopia, dysarthria/dysphagia, neck/back pain, focal numbness/tingling/weakness, bowel dysfunction, anosmia or tremors. She denies any urinary incontinence but wears pads. She denies any family history of dementia, no alcohol use. She recalls a bowl hitting her head at work several years ago, no significant head injuries.   I personally reviewed MRI brain with and without contrast done 09/2018 which did not show any acute changes. There was mild diffuse volume loss, moderate chronic microvascular disease.    Observations/Objective:  Limited due to nature of phone visit. She is awake, alert, oriented x 3. No dysarthria. She is noted to repeat herself several times during the visit that she is fine by herself.  Montreal Cognitive Assessment Blind 06/04/2019  Attention: Read list of digits (0/2) 1  Attention: Read list of letters (0/1) 1  Attention: Serial 7 subtraction starting at 100 (0/3) 0  Language: Repeat phrase (0/2) 0  Language : Fluency (0/1) 1  Abstraction (0/2) 2  Delayed Recall (0/5) 0  Orientation (0/6) 5  Total 10  Adjusted Score (based on education) 11/22    Assessment and Plan:   This is a pleasant 73 yo RH woman with a history of hypertension, diabetes, breast cancer s/p mastectomy, with Mild Cognitive Impairment. Her MOCA blind (done over phone) today was 11/22. She denies any difficulties with complex tasks, however there is no family present to corroborate history and patient repeatedly declined my question to speak to her daughter, saying she only sees  her once a month. Continue to monitor closely, I asked that family be present on her next visit. Follow-up in 6 months, she knows to call for any changes.   Follow Up Instructions:   -I discussed the assessment and treatment plan with the patient. The patient was provided an opportunity to ask questions and all were answered. The patient agreed with the plan and demonstrated an understanding of the instructions.   The patient was advised to call back or seek an in-person evaluation if the symptoms worsen or if the condition fails to improve as anticipated.    Total Time spent in visit with the patient was:  20 minutes, of which 100% of the time was spent in counseling and/or coordinating care on the above.   Pt understands and agrees with the plan of care outlined.     Cameron Sprang, MD

## 2019-06-17 ENCOUNTER — Other Ambulatory Visit: Payer: Self-pay | Admitting: Family Medicine

## 2019-07-16 LAB — HM DIABETES EYE EXAM

## 2019-07-17 ENCOUNTER — Encounter: Payer: Self-pay | Admitting: Family Medicine

## 2019-07-25 ENCOUNTER — Other Ambulatory Visit: Payer: Self-pay | Admitting: Family Medicine

## 2019-08-26 ENCOUNTER — Other Ambulatory Visit: Payer: Self-pay | Admitting: Family Medicine

## 2019-08-27 ENCOUNTER — Telehealth: Payer: Self-pay | Admitting: Family Medicine

## 2019-08-27 NOTE — Telephone Encounter (Signed)
[name]. NTBS 30 days given 07/25/19. Last A1C & OV 01/27/19

## 2019-08-27 NOTE — Telephone Encounter (Signed)
Patient aware and televisit scheduled due to no transportation.

## 2019-08-28 ENCOUNTER — Encounter: Payer: Self-pay | Admitting: Family Medicine

## 2019-08-28 ENCOUNTER — Telehealth: Payer: Self-pay

## 2019-08-28 ENCOUNTER — Ambulatory Visit (INDEPENDENT_AMBULATORY_CARE_PROVIDER_SITE_OTHER): Payer: Medicare Other | Admitting: Family Medicine

## 2019-08-28 DIAGNOSIS — E039 Hypothyroidism, unspecified: Secondary | ICD-10-CM

## 2019-08-28 DIAGNOSIS — Z794 Long term (current) use of insulin: Secondary | ICD-10-CM | POA: Diagnosis not present

## 2019-08-28 DIAGNOSIS — I10 Essential (primary) hypertension: Secondary | ICD-10-CM

## 2019-08-28 DIAGNOSIS — E782 Mixed hyperlipidemia: Secondary | ICD-10-CM

## 2019-08-28 DIAGNOSIS — E119 Type 2 diabetes mellitus without complications: Secondary | ICD-10-CM | POA: Diagnosis not present

## 2019-08-28 NOTE — Telephone Encounter (Signed)
Please tell her daughter that Brianna Weber is somewhat repetetive, but otherwise was able to converse appropriately, recall well when discussing glucose, etc. She states she is able to do all self care. That could be confirmed by a visit from the kids or a discussion with the home health nurse (insurance)  that visits. I suspect mild cognitive impairment, but not sufficient to consider treatment. She did comment that she wishes her children would call her more. WS

## 2019-08-28 NOTE — Progress Notes (Signed)
Subjective:    Patient ID: Brianna Weber, female    DOB: January 04, 1946, 73 y.o.   MRN: [id number]   HPI: Brianna Weber is a 73 y.o. female presenting for presents forFollow-up of diabetes. Patient checks blood sugar at home.   100-140.  Fasting. Health nurse is coming by 2-3 times a month. Pt. Says they are pleased with her numbers. Patient denies symptoms such as polyuria, polydipsia, excessive hunger, nausea No significant hypoglycemic spells noted. Medications reviewed. Pt reports taking them regularly without complication/adverse reaction being reported today.    follow-up of elevated cholesterol. Doing well without complaints on current medication. Denies side effects of statin including myalgia and arthralgia and nausea. Currently no chest pain, shortness of breath or other cardiovascular related symptoms noted.   follow-up on  thyroid. The patient has a history of hypothyroidism for many years. It has been stable recently. Pt. denies any change in  voice, loss of hair, heat or cold intolerance. Energy level has been adequate to good. Patient denies constipation and diarrhea. No myxedema. Medication is as noted below. Verified that pt is taking it daily on an empty stomach. Well tolerated.  Lost husband recently. Anxious. Staying active. Mowing, cleaning, cooking for herself.    Depression screen [facility] 2/9 01/27/2019 09/16/2018 08/30/2018 07/17/2018 03/19/2018  Decreased Interest 1 2 1  0 0  Down, Depressed, Hopeless 1 0 1 0 1  PHQ - 2 Score 2 2 2  0 1  Altered sleeping 0 0 0 - -  Tired, decreased energy 0 0 1 - -  Change in appetite 1 0 1 - -  Feeling bad or failure about yourself  0 0 0 - -  Trouble concentrating 0 0 1 - -  Moving slowly or fidgety/restless 0 0 0 - -  Suicidal thoughts 0 0 0 - -  PHQ-9 Score 3 2 5  - -  Difficult doing work/chores - Somewhat difficult - - -     Relevant past medical, surgical, family and social history reviewed and updated as indicated.   Interim medical history since our last visit reviewed. Allergies and medications reviewed and updated.  ROS:  Review of Systems  Constitutional: Negative.   HENT: Negative for congestion.   Eyes: Negative for visual disturbance.  Respiratory: Negative for shortness of breath.   Cardiovascular: Negative for chest pain.  Gastrointestinal: Negative for abdominal pain, constipation, diarrhea, nausea and vomiting.  Genitourinary: Negative for difficulty urinating.  Musculoskeletal: Negative for arthralgias and myalgias.  Neurological: Negative for headaches.  Psychiatric/Behavioral: Negative for sleep disturbance.     Social History   Tobacco Use  Smoking Status Never Smoker  Smokeless Tobacco Never Used       Objective:     Wt Readings from Last 3 Encounters:  06/04/19 133 lb 6 oz (60.5 kg)  01/27/19 136 lb 6 oz (61.9 kg)  11/15/18 139 lb (63 kg)     Exam deferred. Pt. Harboring due to COVID 19. Phone visit performed.   Assessment & Plan:   1. Type 2 diabetes mellitus without complication, with long-term current use of insulin ([place])   2. Essential hypertension   3. Mixed hyperlipidemia   4. Hypothyroidism, unspecified type     No orders of the defined types were placed in this encounter.   No orders of the defined types were placed in this encounter.     Diagnoses and all orders for this visit:  Type 2 diabetes mellitus without complication, with long-term current use  of insulin ([place])  Essential hypertension  Mixed hyperlipidemia  Hypothyroidism, unspecified type    Virtual Visit via telephone Note  I discussed the limitations, risks, security and privacy concerns of performing an evaluation and management service by telephone and the availability of in person appointments. The patient was identified with two identifiers. Pt.expressed understanding and agreed to proceed. Pt. Is at home. Dr. Livia Snellen is in his office.  Follow Up Instructions:   I  discussed the assessment and treatment plan with the patient. The patient was provided an opportunity to ask questions and all were answered. The patient agreed with the plan and demonstrated an understanding of the instructions.   The patient was advised to call back or seek an in-person evaluation if the symptoms worsen or if the condition fails to improve as anticipated.   Total minutes including chart review and phone contact time: 26   Follow up plan: Return in about 4 months (around 12/29/2019) for diabetes, hypertension, Hypothyroidism, cholesterol.  [name], MD [place]

## 2019-08-28 NOTE — Telephone Encounter (Signed)
Daughter called and is concerned about possible dementia in mother. All of her children live far away and the daughter would like to speak to you if possible to discuss. Her number is [phone] and her name is [name].

## 2019-08-28 NOTE — Telephone Encounter (Signed)
FYI, Patient has a televisit  appointment scheduled today. Daughter says mom has some dementia and she will encourage her to answer provider's questions.

## 2019-08-28 NOTE — Telephone Encounter (Signed)
Daughter aware, mother had conversation with pcp.

## 2019-09-01 ENCOUNTER — Telehealth: Payer: Self-pay | Admitting: Family Medicine

## 2019-09-02 ENCOUNTER — Ambulatory Visit: Payer: Medicare Other | Admitting: Family Medicine

## 2019-09-02 ENCOUNTER — Ambulatory Visit (INDEPENDENT_AMBULATORY_CARE_PROVIDER_SITE_OTHER): Payer: Medicare Other | Admitting: *Deleted

## 2019-09-02 DIAGNOSIS — Z0000 Encounter for general adult medical examination without abnormal findings: Secondary | ICD-10-CM | POA: Diagnosis not present

## 2019-09-02 NOTE — Telephone Encounter (Signed)
FYI patient does not want a colonoscopy

## 2019-09-02 NOTE — Patient Instructions (Signed)
Preventive Care 38 Years and Older, Female Preventive care refers to lifestyle choices and visits with your health care provider that can promote health and wellness. This includes:  A yearly physical exam. This is also called an annual well check.  Regular dental and eye exams.  Immunizations.  Screening for certain conditions.  Healthy lifestyle choices, such as diet and exercise. What can I expect for my preventive care visit? Physical exam Your health care provider will check:  Height and weight. These may be used to calculate body mass index (BMI), which is a measurement that tells if you are at a healthy weight.  Heart rate and blood pressure.  Your skin for abnormal spots. Counseling Your health care provider may ask you questions about:  Alcohol, tobacco, and drug use.  Emotional well-being.  Home and relationship well-being.  Sexual activity.  Eating habits.  History of falls.  Memory and ability to understand (cognition).  Work and work Statistician.  Pregnancy and menstrual history. What immunizations do I need?  Influenza (flu) vaccine  This is recommended every year. Tetanus, diphtheria, and pertussis (Tdap) vaccine  You may need a Td booster every 10 years. Varicella (chickenpox) vaccine  You may need this vaccine if you have not already been vaccinated. Zoster (shingles) vaccine  You may need this after age 33. Pneumococcal conjugate (PCV13) vaccine  One dose is recommended after age 33. Pneumococcal polysaccharide (PPSV23) vaccine  One dose is recommended after age 72. Measles, mumps, and rubella (MMR) vaccine  You may need at least one dose of MMR if you were born in 1957 or later. You may also need a second dose. Meningococcal conjugate (MenACWY) vaccine  You may need this if you have certain conditions. Hepatitis A vaccine  You may need this if you have certain conditions or if you travel or work in places where you may be exposed  to hepatitis A. Hepatitis B vaccine  You may need this if you have certain conditions or if you travel or work in places where you may be exposed to hepatitis B. Haemophilus influenzae type b (Hib) vaccine  You may need this if you have certain conditions. You may receive vaccines as individual doses or as more than one vaccine together in one shot (combination vaccines). Talk with your health care provider about the risks and benefits of combination vaccines. What tests do I need? Blood tests  Lipid and cholesterol levels. These may be checked every 5 years, or more frequently depending on your overall health.  Hepatitis C test.  Hepatitis B test. Screening  Lung cancer screening. You may have this screening every year starting at age 39 if you have a 30-pack-year history of smoking and currently smoke or have quit within the past 15 years.  Colorectal cancer screening. All adults should have this screening starting at age 36 and continuing until age 15. Your health care provider may recommend screening at age 23 if you are at increased risk. You will have tests every 1-10 years, depending on your results and the type of screening test.  Diabetes screening. This is done by checking your blood sugar (glucose) after you have not eaten for a while (fasting). You may have this done every 1-3 years.  Mammogram. This may be done every 1-2 years. Talk with your health care provider about how often you should have regular mammograms.  BRCA-related cancer screening. This may be done if you have a family history of breast, ovarian, tubal, or peritoneal cancers.  Other tests  Sexually transmitted disease (STD) testing.  Bone density scan. This is done to screen for osteoporosis. You may have this done starting at age 55. Follow these instructions at home: Eating and drinking  Eat a diet that includes fresh fruits and vegetables, whole grains, lean protein, and low-fat dairy products. Limit  your intake of foods with high amounts of sugar, saturated fats, and salt.  Take vitamin and mineral supplements as recommended by your health care provider.  Do not drink alcohol if your health care provider tells you not to drink.  If you drink alcohol: ? Limit how much you have to 0-1 drink a day. ? Be aware of how much alcohol is in your drink. In the [place] one drink equals one 12 oz bottle of beer (355 mL), one 5 oz glass of wine (148 mL), or one 1 oz glass of hard liquor (44 mL). Lifestyle  Take daily care of your teeth and gums.  Stay active. Exercise for at least 30 minutes on 5 or more days each week.  Do not use any products that contain nicotine or tobacco, such as cigarettes, e-cigarettes, and chewing tobacco. If you need help quitting, ask your health care provider.  If you are sexually active, practice safe sex. Use a condom or other form of protection in order to prevent STIs (sexually transmitted infections).  Talk with your health care provider about taking a low-dose aspirin or statin. What's next?  Go to your health care provider once a year for a well check visit.  Ask your health care provider how often you should have your eyes and teeth checked.  Stay up to date on all vaccines. This information is not intended to replace advice given to you by your health care provider. Make sure you discuss any questions you have with your health care provider. Document Released: 11/26/2015 Document Revised: 10/24/2018 Document Reviewed: 10/24/2018 Elsevier Patient Education  2020 [employer].

## 2019-09-02 NOTE — Telephone Encounter (Signed)
Please cancel the referral

## 2019-09-02 NOTE — Progress Notes (Signed)
MEDICARE ANNUAL WELLNESS VISIT  09/02/2019  Telephone Visit Disclaimer This Medicare AWV was conducted by telephone due to national recommendations for restrictions regarding the COVID-19 Pandemic (e.g. social distancing).  I verified, using two identifiers, that I am speaking with Brianna Weber or their authorized healthcare agent. I discussed the limitations, risks, security, and privacy concerns of performing an evaluation and management service by telephone and the potential availability of an in-person appointment in the future. The patient expressed understanding and agreed to proceed.   Subjective:  Brianna Weber is a 73 y.o. female patient of [name], [name], MD who had a Medicare Annual Wellness Visit today via telephone. Brianna Weber is Retired and lives alone. 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 she is minimally physically active and enjoys reading the bible, crocheting and cleaning her house.  Patient Care Team: [name], MD as PCP - General (Family Medicine)  Advanced Directives 09/02/2019 08/27/2018 08/26/2018 10/02/2016 09/16/2014  Does Patient Have a Medical Advance Directive? Yes Yes Yes Yes Yes  Type of Advance Directive Living will Living will Living will Living will;Healthcare Power of Attorney -  Does patient want to make changes to medical advance directive? No - Patient declined No - Patient declined - - -  Copy of [place] in Chart? - - - No - copy requested No - copy requested  Would patient like information on creating a medical advance directive? - No - Patient declined No - Patient declined - -    Hospital Utilization Over  the Past 12 Months: # of hospitalizations or ER visits: 0 # of surgeries: 0  Review of Systems    Patient reports that her overall health is better compared to last year.  History obtained from chart review  Patient Reported Readings (BP, Pulse, CBG, Weight, etc) none  Pain Assessment Pain : No/denies pain     Current Medications & Allergies (verified) Allergies as of 09/02/2019   No Known Allergies     Medication List       Accurate as of September 02, 2019  9:47 AM. If you have any questions, ask your nurse or doctor.        Allergy Relief 25 MG tablet Generic drug: diphenhydrAMINE Take 25 mg by mouth daily.   amLODipine 10 MG tablet Commonly known as: NORVASC Take 1 tablet (10 mg total) by mouth daily.   aspirin 81 MG tablet Take 81 mg by mouth daily.   benazepril 20 MG tablet Commonly known as: LOTENSIN TAKE 1 TABLET BY MOUTH DAILY   furosemide 40 MG tablet Commonly known as: LASIX TAKE 1 TABLET BY MOUTH DAILY   glucose blood test strip Commonly known as: ONE TOUCH ULTRA TEST Test bid.   HumuLIN 70/30 (70-30) 100 UNIT/ML injection Generic drug: insulin NPH-regular Human INJECT 55 UNITS EACH MORNING AND 60 UNITS EACH EVENING   isosorbide mononitrate 60 MG 24 hr tablet Commonly known as: IMDUR Take 1 tablet (60 mg total) by mouth daily.   levothyroxine 137 MCG tablet Commonly known as: SYNTHROID TAKE 1 TABLET EVERY MORNING BEFORE BREAKFAST   simvastatin 10 MG tablet Commonly known as: ZOCOR TAKE 1 TABLET  ONCE A DAY   vitamin B-12 500 MCG tablet Commonly known as: CYANOCOBALAMIN Take 500 mcg by mouth daily.   vitamin E 200 UNIT capsule Generic drug: vitamin E Take 200 Units by mouth daily.       History (reviewed): Past Medical History:  Diagnosis Date  . Allergy   . Arrhythmia    bradycardia  . Breast cancer [facility])    s/p left mastectomy  . Diabetes mellitus   . Diverticulosis of colon   . Hyperlipidemia   . Hypertension   .  NSTEMI (non-ST elevated myocardial infarction) ([place])    2004 secondary to stress induced cardiomyopathy. Normal LV function by repeatr echo.  . Peripheral edema   . Thyroid disease    hypothyroidism  . Vitamin D deficiency    Past Surgical History:  Procedure Laterality Date  . ABDOMINAL HYSTERECTOMY    . CHOLECYSTECTOMY    . MASTECTOMY     left   Family History  Problem Relation Age of Onset  . Hypertension Mother   . Heart disease Mother   . Heart attack Father   . Heart disease Sister   . Heart disease Brother   . Cancer Maternal Aunt        breast   . Heart disease Maternal Aunt   . Hyperlipidemia Maternal Aunt   . Hypertension Maternal Aunt   . Heart disease Maternal Uncle   . Hyperlipidemia Maternal Uncle   . Hypertension Maternal Uncle   . Heart disease Maternal Grandmother   . Heart disease Sister   . Heart disease Sister   . Heart disease Brother   . Diabetes Daughter   . Hypertension Other    Social History   Socioeconomic History  . Marital status: Widowed    Spouse name: Not on file  . Number of children: 4  . Years of education: 43  . Highest education level: 11th grade  Occupational History  . Occupation: retired  Scientific laboratory technician  . Financial resource strain: Not hard at all  . Food insecurity    Worry: Never true    Inability: Never true  . Transportation needs    Medical: No    Non-medical: No  Tobacco Use  . Smoking status: Never Smoker  . Smokeless tobacco: Never Used  Substance and Sexual Activity  . Alcohol use: No  . Drug use: No  . Sexual activity: Not Currently    Birth control/protection: Surgical  Lifestyle  . Physical activity    Days per week: 4 days    Minutes per session: 10 min  . Stress: Not at all  Relationships  . Social connections    Talks on phone: More than three times a week    Gets together: More than three times a week    Attends religious service: Never    Active member of club or organization: No     Attends meetings of clubs or organizations: Never    Relationship status: Widowed  Other Topics Concern  . 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 N  Vision? N  Comment wears reading glasses-gets yearly eye exam  Difficulty concentrating or making decisions? Y  Comment pt sees Dr Delice Lesch for cognitive impairment, pt is very repetitive on the phone during the call  Walking or climbing stairs? N  Dressing or bathing? N  Doing errands, shopping? N  Comment pt states she is still driving and does everything on her own  Preparing Food and eating ? N  Using the Toilet? N  In the past six months, have you accidently leaked urine? Y  Comment wears a pad all the time  Do you have problems with loss of bowel control? N  Managing your Medications? N  Comment pt gets her medications from [facility] and they send everything in blister packs  Managing your Finances? N  Housekeeping or managing your Housekeeping? N  Some recent data might be hidden    Patient Education/ Literacy How often do you need to have someone help you when you read instructions, pamphlets, or other written materials from your doctor or pharmacy?: 1 - Never What is the last grade level you completed in school?: 11th grade  Exercise Current Exercise Habits: Home exercise routine, Type of exercise: treadmill, Time (Minutes): 10, Frequency (Times/Week): 4, Weekly Exercise (Minutes/Week): 40, Intensity: Mild, Exercise limited by: neurologic condition(s)  Diet Patient reports consuming 2 meals a day and 1 snack(s) a day Patient  reports that her primary diet is: Regular Patient reports that she does have regular access to food.   Depression Screen PHQ 2/9 Scores 09/02/2019 01/27/2019 09/16/2018 08/30/2018 07/17/2018 03/19/2018 03/27/2017  PHQ - 2 Score 0 2 2 2  0 1 0  PHQ- 9 Score - 3 2 5  - - -     Fall Risk Fall Risk  09/02/2019 06/04/2019 01/27/2019 11/15/2018 09/16/2018  Falls in the past year? 0 0 0 0 0  Comment - - - - -  Number falls in past yr: 0 0 - 0 -  Injury with Fall? 0 0 - 0 -  Risk for fall due to : Mental status change - - - -  Risk for fall due to: Comment pt has mild cognitive impairment which she follows up with Dr Delice Lesch for - - - -  Follow up Falls prevention discussed Falls evaluation completed - - -  Comment Get rid of all throw rugs in the house, adequate lighting in the walkways and grab bars in the bathroom - - - -     Objective:  Brianna Weber seemed alert and oriented and she participated appropriately during our telephone visit.  Blood Pressure Weight BMI  BP Readings from Last 3 Encounters:  01/27/19 111/67  11/15/18 128/62  09/16/18 (!) 124/55   Wt Readings from Last 3 Encounters:  06/04/19 133 lb 6 oz (60.5 kg)  01/27/19 136 lb 6 oz (61.9 kg)  11/15/18 139 lb (63 kg)   BMI Readings from Last 1 Encounters:  06/04/19 24.01 kg/m    *Unable to obtain current vital signs, weight, and BMI due to telephone visit type  Hearing/Vision  . Merrilyn did not seem to have difficulty with hearing/understanding during the telephone conversation . Reports that she has not had a formal eye exam by an eye care professional within the past year . Reports that she has not had a formal hearing evaluation within the past year *Unable to fully assess hearing and vision during telephone visit type  Cognitive Function: 6CIT Screen 09/02/2019  What Year? 0 points  What month? 0 points  What time? 0 points  Count back from 20 0 points  Months in reverse 4 points  Repeat phrase 6 points   Total Score 10   (Normal:0-7, Significant for Dysfunction: >8)  Normal Cognitive Function Screening: No: this was expected as she sees Dr Delice Lesch for mild cognitive impairment. She had a telephone visit in 05/2019 and her next appointment is in March of 2021.   Immunization & Health Maintenance Record Immunization History  Administered Date(s) Administered  . Influenza, High Dose Seasonal PF 09/06/2017  . Influenza,inj,Quad PF,6+ Mos 09/16/2014, 08/12/2015, 08/22/2016  . Pneumococcal Conjugate-13 12/25/2014  . Pneumococcal Polysaccharide-23 03/17/2013    Health Maintenance  Topic Date Due  . Hepatitis C Screening  1946-06-30  . PAP SMEAR-Modifier  03/03/2016  . DEXA SCAN  03/25/2017  . FOOT EXAM  12/17/2018  . COLON CANCER SCREENING ANNUAL FOBT  01/30/2019  . MAMMOGRAM  02/08/2019  . INFLUENZA VACCINE  06/14/2019  . HEMOGLOBIN A1C  07/30/2019  . OPHTHALMOLOGY EXAM  07/15/2020  . COLONOSCOPY  12/25/2021  . TETANUS/TDAP  11/01/2023  . PNA vac Low Risk Adult  Completed       Assessment  This is a routine wellness examination for Brianna Weber.  Health Maintenance: Due or Overdue Health Maintenance Due  Topic Date Due  . Hepatitis C Screening  June 19, 1946  . PAP SMEAR-Modifier  03/03/2016  . DEXA SCAN  03/25/2017  . FOOT EXAM  12/17/2018  . COLON CANCER SCREENING ANNUAL FOBT  01/30/2019  . MAMMOGRAM  02/08/2019  . INFLUENZA VACCINE  06/14/2019  . HEMOGLOBIN A1C  07/30/2019    Brianna Weber does not need a referral for Community Assistance: Care Management:   no Social Work:    no Prescription Assistance:  no Nutrition/Diabetes Education:  no   Plan:  Personalized Goals Goals Addressed            This Visit's Progress   . DIET - INCREASE WATER INTAKE       Try to drink 6-8 glasses of water daily.      Personalized Health Maintenance & Screening Recommendations  Pt declined all recommended Health Maintenance/Screening saying she is "just fine"    Lung Cancer Screening Recommended: no (Low Dose CT Chest recommended if Age 63-80 years, 30 pack-year currently smoking OR have quit w/in past 15 years) Hepatitis C Screening recommended: yes HIV Screening recommended: no  Advanced Directives: Written information was not prepared per patient's request.  Referrals & Orders No orders of the defined types were placed in this encounter.   Follow-up Plan . Follow-up with [name], MD as planned . Bring a copy of your Advanced Directive in for our records   I have personally reviewed and noted the following in the patient's chart:   . Medical and social history . Use of alcohol, tobacco or illicit drugs  . Current medications and supplements . Functional ability and status . Nutritional status . Physical activity . Advanced directives . List of other physicians . Hospitalizations, surgeries, and ER visits in previous 12 months . Vitals . Screenings to include cognitive, depression, and falls . Referrals and appointments  In addition, I have reviewed and discussed with Brianna Weber certain preventive protocols, quality metrics, and best practice recommendations. A written personalized care plan for preventive services as well as general preventive health recommendations is available and can be mailed to the patient at her request.  Milas Hock, [place]  [id number]

## 2019-09-06 ENCOUNTER — Other Ambulatory Visit: Payer: Self-pay | Admitting: Family Medicine

## 2019-09-08 ENCOUNTER — Other Ambulatory Visit: Payer: Self-pay | Admitting: Family Medicine

## 2019-09-08 NOTE — Telephone Encounter (Signed)
televisit 08/28/19

## 2019-10-01 ENCOUNTER — Telehealth: Payer: Self-pay | Admitting: Family Medicine

## 2019-10-01 NOTE — Telephone Encounter (Signed)
FLY

## 2019-10-01 NOTE — Telephone Encounter (Signed)
FYI

## 2019-10-03 ENCOUNTER — Other Ambulatory Visit: Payer: Self-pay | Admitting: Family Medicine

## 2019-11-03 ENCOUNTER — Other Ambulatory Visit: Payer: Self-pay | Admitting: Family Medicine

## 2019-11-29 ENCOUNTER — Other Ambulatory Visit: Payer: Self-pay | Admitting: Family Medicine

## 2019-11-29 DIAGNOSIS — E162 Hypoglycemia, unspecified: Secondary | ICD-10-CM | POA: Diagnosis not present

## 2019-12-01 ENCOUNTER — Other Ambulatory Visit: Payer: Self-pay | Admitting: Family Medicine

## 2019-12-01 ENCOUNTER — Telehealth: Payer: Self-pay | Admitting: Family Medicine

## 2019-12-01 NOTE — Telephone Encounter (Signed)
Discussion with [name]. Pt. Had bee n taking 25 units BID, but prescription was never for that. It had read 5& 60 for the last 7 years. I discussed with her that the error was a miscommunication. The chart has been updated and Ms.  Brianna Weber states that Brianna Weber is doing well since the incident. [name] voiced satisfaction with the discussionand resolution of the issue.  Brianna Fraise, MD

## 2019-12-01 NOTE — Telephone Encounter (Signed)
Pt's daughter is very upset and would like to discuss with Dr Livia Snellen- The deputy had to bust the door down to get into the house, pt was unresponsive and they could see her through the window. She wants to discuss how her insulin dose was changed.

## 2019-12-01 NOTE — Telephone Encounter (Signed)
Please call daughter, [name], regarding her mother's insulin.  Mother had episode on 11/29/19.  EMS was called.  She is very upset.

## 2019-12-31 ENCOUNTER — Other Ambulatory Visit: Payer: Self-pay | Admitting: Family Medicine

## 2020-01-09 ENCOUNTER — Telehealth: Payer: Self-pay | Admitting: Family Medicine

## 2020-01-09 NOTE — Chronic Care Management (AMB) (Signed)
  Chronic Care Management   Note  01/09/2020 Name: JALEYA PEBLEY MRN: [id number] DOB: 1946-02-27  EDILIA GHUMAN is a 74 y.o. year old female who is a primary care patient of [name], [name], MD. I reached out to [name] by phone today in response to a referral sent by Ms. Carnella Fryman [facility] health plan.     Ms. Brossard was given information about Chronic Care Management services today including:  1. CCM service includes personalized support from designated clinical staff supervised by her physician, including individualized plan of care and coordination with other care providers 2. 24/7 contact phone numbers for assistance for urgent and routine care needs. 3. Service will only be billed when office clinical staff spend 20 minutes or more in a month to coordinate care. 4. Only one practitioner may furnish and bill the service in a calendar month. 5. The patient may stop CCM services at any time (effective at the end of the month) by phone call to the office staff. 6. The patient will be responsible for cost sharing (co-pay) of up to 20% of the service fee (after annual deductible is met).  Patient agreed to services and verbal consent obtained.   Follow up plan: Telephone appointment with care management team member scheduled for:02/24/2020  Noreene Larsson, [place], [place], [place] [address] Direct Dial: [phone] [email or web address]: [email or web address]

## 2020-01-13 ENCOUNTER — Telehealth: Payer: Medicare Other | Admitting: Neurology

## 2020-01-29 ENCOUNTER — Other Ambulatory Visit: Payer: Self-pay | Admitting: Family Medicine

## 2020-02-24 ENCOUNTER — Ambulatory Visit (INDEPENDENT_AMBULATORY_CARE_PROVIDER_SITE_OTHER): Payer: Medicare Other | Admitting: *Deleted

## 2020-02-24 DIAGNOSIS — E039 Hypothyroidism, unspecified: Secondary | ICD-10-CM

## 2020-02-24 DIAGNOSIS — E119 Type 2 diabetes mellitus without complications: Secondary | ICD-10-CM | POA: Diagnosis not present

## 2020-02-24 DIAGNOSIS — I10 Essential (primary) hypertension: Secondary | ICD-10-CM | POA: Diagnosis not present

## 2020-02-24 DIAGNOSIS — Z794 Long term (current) use of insulin: Secondary | ICD-10-CM | POA: Diagnosis not present

## 2020-02-24 DIAGNOSIS — E782 Mixed hyperlipidemia: Secondary | ICD-10-CM

## 2020-02-24 NOTE — Chronic Care Management (AMB) (Signed)
Chronic Care Management   Initial Visit Note  02/24/2020 Name: Brianna Weber MRN: [id number] DOB: 05/20/1946  Referred by: [name], MD Reason for referral : Chronic Care Management (Initial Visit)   Brianna Weber is a 74 y.o. year old female who is a primary care patient of [name], [name], MD. The CCM team was consulted for assistance with chronic disease management and care coordination needs related to HTN, hypothyroidism, DM, HLD, depression, hx of Breast CA  Review of patient status, including review of consultants reports, relevant laboratory and other test results, and collaboration with appropriate care team members and the patient's provider was performed as part of comprehensive patient evaluation and provision of chronic care management services.    Subjective: I spoke with Ms Brianna Weber by telephone today. She live alone since her husband died last year but she talks with several of her children daily. She does not have any problems with access to food, transportation, medication, or safe housing. She is overdue for an appointment with Dr Livia Snellen and lab work.    SDOH (Social Determinants of Health) assessments performed: Yes See Care Plan activities for detailed interventions related to SDOH     Objective: Outpatient Encounter Medications as of 02/24/2020  Medication Sig  . amLODipine (NORVASC) 10 MG tablet TAKE 1 TABLET ONCE DAILY  . aspirin 81 MG tablet Take 81 mg by mouth daily.    . benazepril (LOTENSIN) 20 MG tablet Take 1 tablet (20 mg total) by mouth daily.  . diphenhydrAMINE (ALLERGY RELIEF) 25 MG tablet Take 25 mg by mouth daily.  . furosemide (LASIX) 40 MG tablet TAKE 1 TABLET DAILY  . glucose blood (ONE TOUCH ULTRA TEST) test strip Test bid.  . insulin NPH-regular Human (NOVOLIN 70/30) (70-30) 100 UNIT/ML injection Inject 25 Units into the skin 2 (two) times daily with a meal. With breakfast and supper  . isosorbide mononitrate (IMDUR) 60 MG 24 hr  tablet Take 1 tablet (60 mg total) by mouth daily.  Marland Kitchen levothyroxine (SYNTHROID) 137 MCG tablet Take 1 tablet (137 mcg total) by mouth daily before breakfast. Needs to be seen for future refills.  . simvastatin (ZOCOR) 10 MG tablet TAKE 1 TABLET ONCE A DAY  . vitamin B-12 (CYANOCOBALAMIN) 500 MCG tablet Take 500 mcg by mouth daily.  . vitamin E (VITAMIN E) 200 UNIT capsule Take 200 Units by mouth daily.   No facility-administered encounter medications on file as of 02/24/2020.     Lab Results  Component Value Date   HGBA1C 6.5 01/27/2019   HGBA1C 6.8 (H) 08/27/2018   HGBA1C 6.5 07/17/2018   Lab Results  Component Value Date   MICROALBUR neg 09/16/2014   LDLCALC 80 01/27/2019   CREATININE 1.25 (H) 01/27/2019   BP Readings from Last 3 Encounters:  01/27/19 111/67  11/15/18 128/62  09/16/18 (!) 124/55   Wt Readings from Last 3 Encounters:  06/04/19 133 lb 6 oz (60.5 kg)  01/27/19 136 lb 6 oz (61.9 kg)  11/15/18 139 lb (63 kg)     RN Care Plan           This Visit's Progress   . Chronic Disease Management Needs       CARE PLAN ENTRY (see longtitudinal plan of care for additional care plan information)  Current Barriers:  . Chronic Disease Management support, education, and care coordination needs related to HTN, hypothyroidism, DM, HLD, depression, hx of Breast CA  Clinical Goal(s) related to HTN, hypothyroidism, DM, HLD, depression, hx  of Breast CA:  Over the next 60 days, patient will:  . Work with the care management team to address educational, disease management, and care coordination needs  . Begin or continue self health monitoring activities as directed today Measure and record cbg (blood glucose) 2 times daily and Measure and record blood pressure 3 times per week . Call provider office for new or worsened signs and symptoms Blood glucose findings outside established parameters and Blood pressure findings outside established parameters . Call care management team  with questions or concerns . Verbalize basic understanding of patient centered plan of care established today  Interventions related to HTN, hypothyroidism, DM, HLD, depression, hx of Breast CA:  . Evaluation of current treatment plans and patient's adherence to plan as established by provider . Assessed patient understanding of disease states . Assessed patient's education and care coordination needs . Provided disease specific education to patient  . Collaborated with appropriate clinical care team members regarding patient needs . Provided with RN CM contact information and encouraged to reach out as needed . Talked with daughter and tried to schedule visit with Dr Livia Snellen. They will call back when they know what day will work for them.  . Placed future order for routine labs. Patient and daughter preferred to have those done before the visit with Dr Livia Snellen  Patient Self Care Activities related to HTN, hypothyroidism, DM, HLD, depression, hx of Breast CA:  . Patient is able to independently perform ADLs and IADLs  Initial goal documentation          Follow-up Plan:   The care management team will reach out to the patient again over the next 45 days.   Chong Sicilian, BSN, RN-BC Embedded Chronic Care Manager [facility] [facility] Direct Dial: [phone]

## 2020-02-24 NOTE — Patient Instructions (Signed)
Visit Information  Goals Addressed            This Visit's Progress   . Chronic Disease Management Needs       CARE PLAN ENTRY (see longtitudinal plan of care for additional care plan information)  Current Barriers:  . Chronic Disease Management support, education, and care coordination needs related to HTN, hypothyroidism, DM, HLD, depression, hx of Breast CA  Clinical Goal(s) related to HTN, hypothyroidism, DM, HLD, depression, hx of Breast CA:  Over the next 60 days, patient will:  . Work with the care management team to address educational, disease management, and care coordination needs  . Begin or continue self health monitoring activities as directed today Measure and record cbg (blood glucose) 2 times daily and Measure and record blood pressure 3 times per week . Call provider office for new or worsened signs and symptoms Blood glucose findings outside established parameters and Blood pressure findings outside established parameters . Call care management team with questions or concerns . Verbalize basic understanding of patient centered plan of care established today  Interventions related to HTN, hypothyroidism, DM, HLD, depression, hx of Breast CA:  . Evaluation of current treatment plans and patient's adherence to plan as established by provider . Assessed patient understanding of disease states . Assessed patient's education and care coordination needs . Provided disease specific education to patient  . Collaborated with appropriate clinical care team members regarding patient needs . Provided with RN CM contact information and encouraged to reach out as needed . Talked with daughter and tried to schedule visit with Dr Brianna Weber. They will call back when they know what day will work for them.  . Placed future order for routine labs. Patient and daughter preferred to have those done before the visit with Dr Brianna Weber  Patient Self Care Activities related to HTN, hypothyroidism,  DM, HLD, depression, hx of Breast CA:  . Patient is able to independently perform ADLs and IADLs  Initial goal documentation         Brianna Weber was given information about Chronic Care Management services today including:  1. CCM service includes personalized support from designated clinical staff supervised by her physician, including individualized plan of care and coordination with other care providers 2. 24/7 contact phone numbers for assistance for urgent and routine care needs. 3. Service will only be billed when office clinical staff spend 20 minutes or more in a month to coordinate care. 4. Only one practitioner may furnish and bill the service in a calendar month. 5. The patient may stop CCM services at any time (effective at the end of the month) by phone call to the office staff. 6. The patient will be responsible for cost sharing (co-pay) of up to 20% of the service fee (after annual deductible is met).  Patient agreed to services and verbal consent obtained.   The patient verbalized understanding of instructions provided today and declined a print copy of patient instruction materials.   The care management team will reach out to the patient again over the next 45 days.   Chong Sicilian, BSN, RN-BC Embedded Chronic Care Manager [facility] / [place] Management Direct Dial: [phone]

## 2020-02-27 ENCOUNTER — Other Ambulatory Visit: Payer: Self-pay | Admitting: Family Medicine

## 2020-03-27 ENCOUNTER — Other Ambulatory Visit: Payer: Self-pay | Admitting: Family Medicine

## 2020-03-31 ENCOUNTER — Ambulatory Visit (INDEPENDENT_AMBULATORY_CARE_PROVIDER_SITE_OTHER): Payer: Medicare Other | Admitting: *Deleted

## 2020-03-31 DIAGNOSIS — Z794 Long term (current) use of insulin: Secondary | ICD-10-CM

## 2020-03-31 DIAGNOSIS — E119 Type 2 diabetes mellitus without complications: Secondary | ICD-10-CM

## 2020-03-31 NOTE — Chronic Care Management (AMB) (Signed)
Chronic Care Management   Follow Up Note   03/31/2020 Name: Brianna Weber MRN: [id number] DOB: Jun 26, 1946  Referred by: [name], MD Reason for referral : Chronic Care Management (RN follow-up)   Brianna Weber is a 74 y.o. year old female who is a primary care patient of [name], [name], MD. The CCM team was consulted for assistance with chronic disease management and care coordination needs.    Review of patient status, including review of consultants reports, relevant laboratory and other test results, and collaboration with appropriate care team members and the patient's provider was performed as part of comprehensive patient evaluation and provision of chronic care management services.    I spoke with Ms Brianna Weber by telephone today regarding management of her chronic medical conditions.   SDOH (Social Determinants of Health) assessments performed: No See Care Plan activities for detailed interventions related to [facility])     Outpatient Encounter Medications as of 03/31/2020  Medication Sig  . amLODipine (NORVASC) 10 MG tablet TAKE 1 TABLET ONCE DAILY  . aspirin 81 MG tablet Take 81 mg by mouth daily.    . benazepril (LOTENSIN) 20 MG tablet TAKE 1 TABLET ONCE DAILY  . diphenhydrAMINE (ALLERGY RELIEF) 25 MG tablet Take 25 mg by mouth daily.  . furosemide (LASIX) 40 MG tablet TAKE 1 TABLET DAILY  . glucose blood (ONE TOUCH ULTRA TEST) test strip Test bid.  . insulin NPH-regular Human (NOVOLIN 70/30) (70-30) 100 UNIT/ML injection Inject 25 Units into the skin 2 (two) times daily with a meal. With breakfast and supper  . isosorbide mononitrate (IMDUR) 60 MG 24 hr tablet TAKE 1 TABLET ONCE DAILY  . levothyroxine (SYNTHROID) 137 MCG tablet Take 1 tablet (137 mcg total) by mouth daily before breakfast. (.needs labwork)  . simvastatin (ZOCOR) 10 MG tablet TAKE 1 TABLET ONCE A DAY  . vitamin B-12 (CYANOCOBALAMIN) 500 MCG tablet Take 500 mcg by mouth daily.  . vitamin E (VITAMIN  E) 200 UNIT capsule Take 200 Units by mouth daily.   No facility-administered encounter medications on file as of 03/31/2020.     RN Care Plan    . "I want to keep my blood sugar under control" (pt-stated)       CARE PLAN ENTRY (see longitudinal plan of care for additional care plan information)  Current Barriers:  . Chronic Disease Management support and education needs related to diabetes.  Nurse Case Manager Clinical Goal(s):  Marland Kitchen Over the next 45 days, patient will work with Consulting civil engineer to address needs related to diabetes . Over the next 30 days, patient will schedule a routine follow-up appointment with Dr Livia Snellen  Interventions:  . Inter-disciplinary care team collaboration (see longitudinal plan of care) . Chart reviewed . Talked with patient by telephone . Reviewed and discussed medications: Novolin 70/30 25 units BID . Discussed home blood sugar readings o Typically checking only in the morning. Tends to forget to check it in the evening.  Date (AM/PM) Blood Sugar  03/31/20 AM 146  03/30/20 AM 136  03/29/20 AM 164  03/26/20 AM 129 .  Encouraged patient to check and record blood sugar twice a day and to call PCP at [phone] with any readings outside of the recommended range . Discussed typical diet o Encouraged lean proteins and complex carbs . Discussed activity level o Patient is very active around her home and yard and does all of her own house and yard work . Reminded patient that she needs to schedule  a follow-up appointment with Dr Livia Snellen and that there are orders in place to have drawn before the appointment if she prefers o Patient will have daughter call to schedule the appointment so that it fits in with her schedule  Patient Self Care Activities:  . Performs ADL's independently . Performs IADL's independently  Initial goal documentation         Plan:   The care management team will reach out to the patient again over the next 45 days.    Chong Sicilian, BSN, RN-BC Embedded Chronic Care Manager [facility] / [place] Management Direct Dial: [phone]

## 2020-03-31 NOTE — Patient Instructions (Signed)
Visit Information  Goals Addressed            This Visit's Progress     Patient Stated   . "I want to keep my blood sugar under control" (pt-stated)       CARE PLAN ENTRY (see longitudinal plan of care for additional care plan information)  Current Barriers:  . Chronic Disease Management support and education needs related to diabetes.  Nurse Case Manager Clinical Goal(s):  Marland Kitchen Over the next 45 days, patient will work with Consulting civil engineer to address needs related to diabetes . Over the next 30 days, patient will schedule a routine follow-up appointment with Dr Livia Snellen  Interventions:  . Inter-disciplinary care team collaboration (see longitudinal plan of care) . Chart reviewed . Talked with patient by telephone . Reviewed and discussed medications: Novolin 70/30 25 units BID . Discussed home blood sugar readings o Typically checking only in the morning. Tends to forget to check it in the evening.  Date (AM/PM) Blood Sugar  03/31/20 AM 146  03/30/20 AM 136  03/29/20 AM 164  03/26/20 AM 129 .  Encouraged patient to check and record blood sugar twice a day and to call PCP at [phone] with any readings outside of the recommended range . Discussed typical diet o Encouraged lean proteins and complex carbs . Discussed activity level o Patient is very active around her home and yard and does all of her own house and yard work . Reminded patient that she needs to schedule a follow-up appointment with Dr Livia Snellen and that there are orders in place to have drawn before the appointment if she prefers o Patient will have daughter call to schedule the appointment so that it fits in with her schedule  Patient Self Care Activities:  . Performs ADL's independently . Performs IADL's independently  Initial goal documentation        The patient verbalized understanding of instructions provided today and declined a print copy of patient instruction materials.   Follow-up Plan The care  management team will reach out to the patient again over the next 45 days.   Chong Sicilian, BSN, RN-BC Embedded Chronic Care Manager [facility] / [place] Management Direct Dial: [phone]

## 2020-04-23 ENCOUNTER — Other Ambulatory Visit: Payer: Self-pay

## 2020-04-23 ENCOUNTER — Other Ambulatory Visit: Payer: Medicare Other

## 2020-04-23 DIAGNOSIS — I10 Essential (primary) hypertension: Secondary | ICD-10-CM

## 2020-04-23 DIAGNOSIS — Z794 Long term (current) use of insulin: Secondary | ICD-10-CM | POA: Diagnosis not present

## 2020-04-23 DIAGNOSIS — E039 Hypothyroidism, unspecified: Secondary | ICD-10-CM

## 2020-04-23 DIAGNOSIS — E782 Mixed hyperlipidemia: Secondary | ICD-10-CM | POA: Diagnosis not present

## 2020-04-23 DIAGNOSIS — E119 Type 2 diabetes mellitus without complications: Secondary | ICD-10-CM | POA: Diagnosis not present

## 2020-04-23 LAB — BAYER DCA HB A1C WAIVED: HB A1C (BAYER DCA - WAIVED): 5.8 % (ref <7.0)

## 2020-04-24 LAB — CMP14+EGFR
ALT: 10 IU/L (ref 0–32)
AST: 17 IU/L (ref 0–40)
Albumin/Globulin Ratio: 1.5 (ref 1.2–2.2)
Albumin: 4 g/dL (ref 3.7–4.7)
Alkaline Phosphatase: 98 IU/L (ref 48–121)
BUN/Creatinine Ratio: 13 (ref 12–28)
BUN: 22 mg/dL (ref 8–27)
Bilirubin Total: 0.4 mg/dL (ref 0.0–1.2)
CO2: 18 mmol/L — ABNORMAL LOW (ref 20–29)
Calcium: 9.7 mg/dL (ref 8.7–10.3)
Chloride: 106 mmol/L (ref 96–106)
Creatinine, Ser: 1.72 mg/dL — ABNORMAL HIGH (ref 0.57–1.00)
GFR calc Af Amer: 34 mL/min/{1.73_m2} — ABNORMAL LOW (ref >59)
GFR calc non Af Amer: 29 mL/min/{1.73_m2} — ABNORMAL LOW (ref >59)
Globulin, Total: 2.7 g/dL (ref 1.5–4.5)
Glucose: 128 mg/dL — ABNORMAL HIGH (ref 65–99)
Potassium: 4.1 mmol/L (ref 3.5–5.2)
Sodium: 140 mmol/L (ref 134–144)
Total Protein: 6.7 g/dL (ref 6.0–8.5)

## 2020-04-24 LAB — CBC WITH DIFFERENTIAL/PLATELET
Basophils Absolute: 0.1 10*3/uL (ref 0.0–0.2)
Basos: 1 %
EOS (ABSOLUTE): 0.3 10*3/uL (ref 0.0–0.4)
Eos: 4 %
Hematocrit: 31.8 % — ABNORMAL LOW (ref 34.0–46.6)
Hemoglobin: 10.5 g/dL — ABNORMAL LOW (ref 11.1–15.9)
Immature Grans (Abs): 0 10*3/uL (ref 0.0–0.1)
Immature Granulocytes: 0 %
Lymphocytes Absolute: 1.6 10*3/uL (ref 0.7–3.1)
Lymphs: 25 %
MCH: 28.1 pg (ref 26.6–33.0)
MCHC: 33 g/dL (ref 31.5–35.7)
MCV: 85 fL (ref 79–97)
Monocytes Absolute: 0.5 10*3/uL (ref 0.1–0.9)
Monocytes: 8 %
Neutrophils Absolute: 3.9 10*3/uL (ref 1.4–7.0)
Neutrophils: 62 %
Platelets: 297 10*3/uL (ref 150–450)
RBC: 3.74 x10E6/uL — ABNORMAL LOW (ref 3.77–5.28)
RDW: 12.3 % (ref 11.7–15.4)
WBC: 6.3 10*3/uL (ref 3.4–10.8)

## 2020-04-24 LAB — LIPID PANEL
Chol/HDL Ratio: 2.5 ratio (ref 0.0–4.4)
Cholesterol, Total: 140 mg/dL (ref 100–199)
HDL: 57 mg/dL (ref >39)
LDL Chol Calc (NIH): 57 mg/dL (ref 0–99)
Triglycerides: 154 mg/dL — ABNORMAL HIGH (ref 0–149)
VLDL Cholesterol Cal: 26 mg/dL (ref 5–40)

## 2020-04-24 LAB — THYROID PANEL WITH TSH
Free Thyroxine Index: 3.9 (ref 1.2–4.9)
T3 Uptake Ratio: 35 % (ref 24–39)
T4, Total: 11.2 ug/dL (ref 4.5–12.0)
TSH: 0.028 u[IU]/mL — ABNORMAL LOW (ref 0.450–4.500)

## 2020-05-01 ENCOUNTER — Other Ambulatory Visit: Payer: Self-pay | Admitting: Family Medicine

## 2020-05-10 ENCOUNTER — Other Ambulatory Visit: Payer: Self-pay | Admitting: Family Medicine

## 2020-05-11 ENCOUNTER — Ambulatory Visit (INDEPENDENT_AMBULATORY_CARE_PROVIDER_SITE_OTHER): Payer: Medicare Other | Admitting: *Deleted

## 2020-05-11 ENCOUNTER — Encounter: Payer: Self-pay | Admitting: *Deleted

## 2020-05-11 DIAGNOSIS — Z794 Long term (current) use of insulin: Secondary | ICD-10-CM | POA: Diagnosis not present

## 2020-05-11 DIAGNOSIS — E119 Type 2 diabetes mellitus without complications: Secondary | ICD-10-CM

## 2020-05-11 DIAGNOSIS — I10 Essential (primary) hypertension: Secondary | ICD-10-CM | POA: Diagnosis not present

## 2020-05-11 NOTE — Patient Instructions (Signed)
Visit Information  Goals Addressed              This Visit's Progress     Patient Stated   .  "I need to follow-up with Dr Livia Snellen" (pt-stated)        [place] (see longitudinal plan of care for additional care plan information)  Current Barriers:  . Care Coordination needs related to appointment scheduling in a patient with DM, hypothyroidism, HLD, and depression (disease states) . Transportation barriers  Nurse Case Manager Clinical Goal(s):  Marland Kitchen Over the next 14 days, patient will have an office visit with PCP  Interventions:  . Inter-disciplinary care team collaboration (see longitudinal plan of care) . Chart reviewed including recent office notes and lab results o Patient had labs drawn on 04/23/20 - Needs to have repeat BMP and CBC and an FOBT due to decrease in Hgb . Again discussed that patient is overdue for an office visit with Dr Livia Snellen . Encouraged patient to schedule appointment and she agreed to let me collaborate with [facility] clinical staff to schedule a visit for 05/20/20 at 12:55 o She will talk with her daughter and make sure she can bring her at this time o Advised patient to have daughter call and reschedule if this doesn't work for her . Discussed need for repeat BMP due to decreased kidney function . Discussed need for repeat CBC and FOBT due to decrease in Hgb . Talked with patient about kidney function and anemia o Denies any symptoms and says that she feels fine . Encouraged patient to reach out to PCP with any new symptoms . Provided with RNCM contact information and encouraged to reach out as needed  Patient Self Care Activities:  . Performs ADL's independently . Performs IADL's independently . Unable to independently drive  Initial goal documentation     .  "I want to keep my blood sugar under control" (pt-stated)        CARE PLAN ENTRY (see longitudinal plan of care for additional care plan information)  Current Barriers:  . Chronic  Disease Management support and education needs related to diabetes.  Nurse Case Manager Clinical Goal(s):  Marland Kitchen Over the next 45 days, patient will work with Consulting civil engineer to address needs related to diabetes . Over the next 30 days, patient will schedule a routine follow-up appointment with Dr Livia Snellen  Interventions:  . Inter-disciplinary care team collaboration (see longitudinal plan of care) . Chart reviewed including recent lab results . Talked with patient by telephone . Discussed home blood sugar readings . Encouraged patient to check and record blood sugar twice a day and to call PCP at [phone] with any readings outside of the recommended range . Discussed typical diet o Encouraged lean proteins and complex carbs . Discussed activity level o Patient is very active around her home and yard and does all of her own house and yard work . Collaborated with PCP office to schedule visit with Dr Livia Snellen on 05/20/20 o  Needs BMP, CBC, and FOBT per last lab result notes . Previously provided with [facility] contact information and encouraged to reach out as needed  Patient Self Care Activities:  . Performs ADL's independently . Performs IADL's independently  Please see past updates related to this goal by clicking on the "Past Updates" button in the selected goal         Patient verbalizes understanding of instructions provided today.   Follow-up Plan The care management team will reach out  to the patient again over the next 30 days.  Next PCP appointment scheduled for: 05/20/20  Chong Sicilian, BSN, RN-BC [place] / [place] Management Direct Dial: [phone]

## 2020-05-11 NOTE — Chronic Care Management (AMB) (Signed)
Chronic Care Management   Follow Up Note   05/11/2020 Name: Brianna Weber MRN: [id number] DOB: 1946-04-18  Referred by: [name], MD Reason for referral : Chronic Care Management (RN follow up)   Brianna Weber is a 74 y.o. year old female who is a primary care patient of [name], [name], MD. The CCM team was consulted for assistance with chronic disease management and care coordination needs.    Review of patient status, including review of consultants reports, relevant laboratory and other test results, and collaboration with appropriate care team members and the patient's provider was performed as part of comprehensive patient evaluation and provision of chronic care management services.    I spoke with Brianna Weber by telephone today regarding management of her chronic medical conditions.  SDOH (Social Determinants of Health) assessments performed: Yes-transportation-relies on family  See Care Plan activities for detailed interventions related to [facility])     Outpatient Encounter Medications as of 05/11/2020  Medication Sig  . amLODipine (NORVASC) 10 MG tablet TAKE 1 TABLET ONCE DAILY  . aspirin 81 MG tablet Take 81 mg by mouth daily.    . benazepril (LOTENSIN) 20 MG tablet TAKE 1 TABLET ONCE DAILY  . diphenhydrAMINE (ALLERGY RELIEF) 25 MG tablet Take 25 mg by mouth daily.  . furosemide (LASIX) 40 MG tablet TAKE 1 TABLET DAILY  . glucose blood (ONETOUCH ULTRA) test strip Test blood sugar twice daily  . insulin NPH-regular Human (NOVOLIN 70/30) (70-30) 100 UNIT/ML injection Inject 25 Units into the skin 2 (two) times daily with a meal. With breakfast and supper  . isosorbide mononitrate (IMDUR) 60 MG 24 hr tablet TAKE 1 TABLET ONCE DAILY  . levothyroxine (SYNTHROID) 137 MCG tablet TAKE 1 TABLET EVERY MORNING BEFORE BREAKFAST  . simvastatin (ZOCOR) 10 MG tablet TAKE 1 TABLET ONCE A DAY  . vitamin B-12 (CYANOCOBALAMIN) 500 MCG tablet Take 500 mcg by mouth daily.  . vitamin  E (VITAMIN E) 200 UNIT capsule Take 200 Units by mouth daily.   No facility-administered encounter medications on file as of 05/11/2020.    Lab Results  Component Value Date   HGBA1C 5.8 04/23/2020   HGBA1C 6.5 01/27/2019   HGBA1C 6.8 (H) 08/27/2018   Lab Results  Component Value Date   MICROALBUR neg 09/16/2014   LDLCALC 57 04/23/2020   CREATININE 1.72 (H) 04/23/2020   Lab Results  Component Value Date   WBC 6.3 04/23/2020   HGB 10.5 (L) 04/23/2020   HCT 31.8 (L) 04/23/2020   MCV 85 04/23/2020   PLT 297 04/23/2020    RN Care Plan   .  "I need to follow-up with Dr Livia Snellen" (pt-stated)        [place] (see longitudinal plan of care for additional care plan information)  Current Barriers:  . Care Coordination needs related to appointment scheduling in a patient with DM, hypothyroidism, HLD, and depression (disease states) . Transportation barriers  Nurse Case Manager Clinical Goal(s):  Marland Kitchen Over the next 14 days, patient will have an office visit with PCP  Interventions:  . Inter-disciplinary care team collaboration (see longitudinal plan of care) . Chart reviewed including recent office notes and lab results o Patient had labs drawn on 04/23/20 - Needs to have repeat BMP and CBC and an FOBT due to decrease in Hgb . Again discussed that patient is overdue for an office visit with Dr Livia Snellen . Encouraged patient to schedule appointment and she agreed to let me collaborate with [facility]  clinical staff to schedule a visit for 05/20/20 at 12:55 o She will talk with her daughter and make sure she can bring her at this time o Advised patient to have daughter call and reschedule if this doesn't work for her . Discussed need for repeat BMP due to decreased kidney function . Discussed need for repeat CBC and FOBT due to decrease in Hgb . Talked with patient about kidney function and anemia o Denies any symptoms and says that she feels fine . Encouraged patient to reach out to PCP  with any new symptoms . Provided with RNCM contact information and encouraged to reach out as needed  Patient Self Care Activities:  . Performs ADL's independently . Performs IADL's independently . Unable to independently drive  Initial goal documentation     .  "I want to keep my blood sugar under control" (pt-stated)        CARE PLAN ENTRY (see longitudinal plan of care for additional care plan information)  Current Barriers:  . Chronic Disease Management support and education needs related to diabetes.  Nurse Case Manager Clinical Goal(s):  Marland Kitchen Over the next 45 days, patient will work with Consulting civil engineer to address needs related to diabetes . Over the next 30 days, patient will schedule a routine follow-up appointment with Dr Livia Snellen  Interventions:  . Inter-disciplinary care team collaboration (see longitudinal plan of care) . Chart reviewed including recent lab results . Talked with patient by telephone . Discussed home blood sugar readings . Encouraged patient to check and record blood sugar twice a day and to call PCP at [phone] with any readings outside of the recommended range . Discussed typical diet o Encouraged lean proteins and complex carbs . Discussed activity level o Patient is very active around her home and yard and does all of her own house and yard work . Collaborated with PCP office to schedule visit with Dr Livia Snellen on 05/20/20 o  Needs BMP, CBC, and FOBT per last lab result notes . Previously provided with [facility] contact information and encouraged to reach out as needed  Patient Self Care Activities:  . Performs ADL's independently . Performs IADL's independently  Please see past updates related to this goal by clicking on the "Past Updates" button in the selected goal          Plan:  The care management team will reach out to the patient again over the next 30 days. \ Appointment with Dr Livia Snellen on 05/20/20 at 12:55   Chong Sicilian, BSN,  RN-BC [place] / [place] Management Direct Dial: [phone]

## 2020-05-20 ENCOUNTER — Ambulatory Visit: Payer: Medicare Other | Admitting: Family Medicine

## 2020-05-26 ENCOUNTER — Other Ambulatory Visit: Payer: Self-pay | Admitting: Family Medicine

## 2020-05-30 NOTE — Telephone Encounter (Signed)
Authorize 30 days only. Then contact the patient letting them know that they will need an appointment before any further prescriptions can be sent in. 

## 2020-06-04 ENCOUNTER — Telehealth: Payer: Self-pay | Admitting: Family Medicine

## 2020-06-04 NOTE — Telephone Encounter (Signed)
Pharmacy on chart is [facility].

## 2020-06-07 ENCOUNTER — Telehealth: Payer: Self-pay | Admitting: Family Medicine

## 2020-06-07 NOTE — Telephone Encounter (Signed)
Created a Family Medicine Sticky comment regarding this

## 2020-06-26 ENCOUNTER — Other Ambulatory Visit: Payer: Self-pay | Admitting: Family Medicine

## 2020-07-27 ENCOUNTER — Other Ambulatory Visit: Payer: Self-pay

## 2020-07-27 ENCOUNTER — Encounter: Payer: Self-pay | Admitting: Family Medicine

## 2020-07-27 ENCOUNTER — Telehealth: Payer: Self-pay | Admitting: Family Medicine

## 2020-07-27 ENCOUNTER — Ambulatory Visit (INDEPENDENT_AMBULATORY_CARE_PROVIDER_SITE_OTHER): Payer: Medicare Other | Admitting: Family Medicine

## 2020-07-27 VITALS — BP 119/56 | HR 73 | Temp 97.4°F | Resp 20 | Ht 62.5 in | Wt 124.0 lb

## 2020-07-27 DIAGNOSIS — E559 Vitamin D deficiency, unspecified: Secondary | ICD-10-CM

## 2020-07-27 DIAGNOSIS — E785 Hyperlipidemia, unspecified: Secondary | ICD-10-CM | POA: Diagnosis not present

## 2020-07-27 DIAGNOSIS — I10 Essential (primary) hypertension: Secondary | ICD-10-CM

## 2020-07-27 DIAGNOSIS — E039 Hypothyroidism, unspecified: Secondary | ICD-10-CM

## 2020-07-27 DIAGNOSIS — Z794 Long term (current) use of insulin: Secondary | ICD-10-CM | POA: Diagnosis not present

## 2020-07-27 DIAGNOSIS — E119 Type 2 diabetes mellitus without complications: Secondary | ICD-10-CM | POA: Diagnosis not present

## 2020-07-27 LAB — BAYER DCA HB A1C WAIVED: HB A1C (BAYER DCA - WAIVED): 5.8 % (ref <7.0)

## 2020-07-27 MED ORDER — AMLODIPINE BESYLATE 10 MG PO TABS
10.0000 mg | ORAL_TABLET | Freq: Every day | ORAL | 1 refills | Status: DC
Start: 2020-07-27 — End: 2020-11-24

## 2020-07-27 MED ORDER — FUROSEMIDE 40 MG PO TABS
40.0000 mg | ORAL_TABLET | Freq: Every day | ORAL | 1 refills | Status: DC
Start: 2020-07-27 — End: 2020-11-29

## 2020-07-27 MED ORDER — SIMVASTATIN 10 MG PO TABS
10.0000 mg | ORAL_TABLET | Freq: Every day | ORAL | 1 refills | Status: DC
Start: 2020-07-27 — End: 2020-11-24

## 2020-07-27 MED ORDER — ISOSORBIDE MONONITRATE ER 60 MG PO TB24
60.0000 mg | ORAL_TABLET | Freq: Every day | ORAL | 1 refills | Status: DC
Start: 2020-07-27 — End: 2020-11-24

## 2020-07-27 MED ORDER — NOVOLIN 70/30 (70-30) 100 UNIT/ML ~~LOC~~ SUSP: 20.0000 [IU] | Freq: Two times a day (BID) | SUBCUTANEOUS | 11 refills | Status: DC

## 2020-07-27 MED ORDER — BENAZEPRIL HCL 20 MG PO TABS
20.0000 mg | ORAL_TABLET | Freq: Every day | ORAL | 1 refills | Status: DC
Start: 2020-07-27 — End: 2020-11-24

## 2020-07-27 NOTE — Progress Notes (Signed)
Subjective:  Patient ID: Brianna Weber, female    DOB: 12-Jul-1946  Age: 74 y.o. MRN: [id number]  CC: Medical Management of Chronic Issues   HPI Brianna Weber presents forFollow-up of diabetes. Patient checks blood sugar at home.  Recent readings returned are in the low 100s.  These of been checked fasting over the last 10 days. Patient denies symptoms such as polyuria, polydipsia, excessive hunger, nausea No significant hypoglycemic spells noted. Medications reviewed. Pt reports taking them regularly without complication/adverse reaction being reported today.  Patient presents for follow-up on  thyroid. The patient has a history of hypothyroidism for many years. It has been stable recently. Pt. denies any change in  voice, loss of hair, heat or cold intolerance. Energy level has been adequate to good. Patient denies constipation and diarrhea. No myxedema. Medication is as noted below. Verified that pt is taking it daily on an empty stomach. Well tolerated. Patient in for follow-up of elevated cholesterol. Doing well without complaints on current medication. Denies side effects of statin including myalgia and arthralgia and nausea. Also in today for liver function testing. Currently no chest pain, shortness of breath or other cardiovascular related symptoms noted.  Follow-up of hypertension. Patient has no history of headache chest pain or shortness of breath or recent cough. Patient also denies symptoms of TIA such as numbness weakness lateralizing. Patient checks  blood pressure at home and has not had any elevated readings recently. Patient denies side effects from his medication. States taking it regularly.     History Brianna Weber has a past medical history of Allergy, Arrhythmia, Breast cancer ([place]), Diabetes mellitus, Diverticulosis of colon, Hyperlipidemia, Hypertension, NSTEMI (non-ST elevated myocardial infarction) ([place]), Peripheral edema, Thyroid disease, and Vitamin D  deficiency.   She has a past surgical history that includes Mastectomy; Cholecystectomy; and Abdominal hysterectomy.   Her family history includes Cancer in her maternal aunt; Diabetes in her daughter; Heart attack in her father; Heart disease in her brother, brother, maternal aunt, maternal grandmother, maternal uncle, mother, sister, sister, and sister; Hyperlipidemia in her maternal aunt and maternal uncle; Hypertension in her maternal aunt, maternal uncle, mother, and another family member.She reports that she has never smoked. She has never used smokeless tobacco. She reports that she does not drink alcohol and does not use drugs.  Current Outpatient Medications on File Prior to Visit  Medication Sig Dispense Refill  . aspirin 81 MG tablet Take 81 mg by mouth daily.      Marland Kitchen glucose blood (ONETOUCH ULTRA) test strip Test blood sugar twice daily 200 strip 0  . levothyroxine (SYNTHROID) 137 MCG tablet TAKE 1 TABLET EVERY MORNING BEFORE BREAKFAST 90 tablet 1  . vitamin B-12 (CYANOCOBALAMIN) 500 MCG tablet Take 500 mcg by mouth daily.    . vitamin E (VITAMIN E) 200 UNIT capsule Take 200 Units by mouth daily.    . diphenhydrAMINE (ALLERGY RELIEF) 25 MG tablet Take 25 mg by mouth daily. (Patient not taking: Reported on 07/27/2020)     No current facility-administered medications on file prior to visit.    ROS Review of Systems  Constitutional: Negative.   HENT: Negative for congestion.   Eyes: Negative for visual disturbance.  Respiratory: Negative for shortness of breath.   Cardiovascular: Negative for chest pain.  Gastrointestinal: Negative for abdominal pain, constipation, diarrhea, nausea and vomiting.  Genitourinary: Negative for difficulty urinating.  Musculoskeletal: Negative for arthralgias and myalgias.  Neurological: Negative for headaches.  Psychiatric/Behavioral: Negative for sleep disturbance.  Objective:  BP (!) 119/56   Pulse 73   Temp (!) 97.4 F (36.3 C) (Temporal)    Resp 20   Ht 5' 2.5" (1.588 m)   Wt 124 lb (56.2 kg)   SpO2 100%   BMI 22.32 kg/m   BP Readings from Last 3 Encounters:  07/27/20 (!) 119/56  01/27/19 111/67  11/15/18 128/62    Wt Readings from Last 3 Encounters:  07/27/20 124 lb (56.2 kg)  06/04/19 133 lb 6 oz (60.5 kg)  01/27/19 136 lb 6 oz (61.9 kg)     Physical Exam Constitutional:      General: She is not in acute distress.    Appearance: She is well-developed.  HENT:     Head: Normocephalic and atraumatic.  Eyes:     Conjunctiva/sclera: Conjunctivae normal.     Pupils: Pupils are equal, round, and reactive to light.  Neck:     Thyroid: No thyromegaly.  Cardiovascular:     Rate and Rhythm: Normal rate and regular rhythm.     Heart sounds: Normal heart sounds. No murmur heard.   Pulmonary:     Effort: Pulmonary effort is normal. No respiratory distress.     Breath sounds: Normal breath sounds. No wheezing or rales.  Abdominal:     General: Bowel sounds are normal. There is no distension.     Palpations: Abdomen is soft.     Tenderness: There is no abdominal tenderness.  Musculoskeletal:        General: Normal range of motion.     Cervical back: Normal range of motion and neck supple.  Lymphadenopathy:     Cervical: No cervical adenopathy.  Skin:    General: Skin is warm and dry.  Neurological:     Mental Status: She is alert and oriented to person, place, and time.  Psychiatric:        Behavior: Behavior normal.        Thought Content: Thought content normal.        Judgment: Judgment normal.       Assessment & Plan:   Brianna Weber was seen today for medical management of chronic issues.  Diagnoses and all orders for this visit:  Hypothyroidism, unspecified type -     Microalbumin / creatinine urine ratio -     Bayer DCA Hb A1c Waived -     CBC with Differential/Platelet -     CMP14+EGFR -     Lipid panel -     Thyroid Panel With TSH  Hyperlipidemia with target LDL less than 100 -      Microalbumin / creatinine urine ratio -     Bayer DCA Hb A1c Waived -     CBC with Differential/Platelet -     CMP14+EGFR -     Lipid panel -     Thyroid Panel With TSH  Essential hypertension -     Microalbumin / creatinine urine ratio -     Bayer DCA Hb A1c Waived -     CBC with Differential/Platelet -     CMP14+EGFR -     Lipid panel -     Thyroid Panel With TSH  Type 2 diabetes mellitus without complication, with long-term current use of insulin (HCC) -     Microalbumin / creatinine urine ratio -     Bayer DCA Hb A1c Waived -     CBC with Differential/Platelet -     CMP14+EGFR -     Lipid panel -  Thyroid Panel With TSH  Vitamin D deficiency -     Microalbumin / creatinine urine ratio -     Bayer DCA Hb A1c Waived -     CBC with Differential/Platelet -     CMP14+EGFR -     Lipid panel -     Thyroid Panel With TSH -     VITAMIN D 25 Hydroxy (Vit-D Deficiency, Fractures)  Other orders -     insulin NPH-regular Human (NOVOLIN 70/30) (70-30) 100 UNIT/ML injection; Inject 20 Units into the skin 2 (two) times daily with a meal. With breakfast and supper -     amLODipine (NORVASC) 10 MG tablet; Take 1 tablet (10 mg total) by mouth daily. -     benazepril (LOTENSIN) 20 MG tablet; Take 1 tablet (20 mg total) by mouth daily. -     furosemide (LASIX) 40 MG tablet; Take 1 tablet (40 mg total) by mouth daily. -     isosorbide mononitrate (IMDUR) 60 MG 24 hr tablet; Take 1 tablet (60 mg total) by mouth daily. -     simvastatin (ZOCOR) 10 MG tablet; Take 1 tablet (10 mg total) by mouth daily.      I have changed Brianna Weber. Brianna Weber's NovoLIN 70/30, amLODipine, benazepril, furosemide, isosorbide mononitrate, and simvastatin. I am also having her maintain her aspirin, vitamin B-12, vitamin E, diphenhydrAMINE, OneTouch Ultra, and levothyroxine.  Meds ordered this encounter  Medications  . insulin NPH-regular Human (NOVOLIN 70/30) (70-30) 100 UNIT/ML injection    Sig: Inject 20  Units into the skin 2 (two) times daily with a meal. With breakfast and supper    Dispense:  12 mL    Refill:  11  . amLODipine (NORVASC) 10 MG tablet    Sig: Take 1 tablet (10 mg total) by mouth daily.    Dispense:  90 tablet    Refill:  1  . benazepril (LOTENSIN) 20 MG tablet    Sig: Take 1 tablet (20 mg total) by mouth daily.    Dispense:  90 tablet    Refill:  1  . furosemide (LASIX) 40 MG tablet    Sig: Take 1 tablet (40 mg total) by mouth daily.    Dispense:  90 tablet    Refill:  1  . isosorbide mononitrate (IMDUR) 60 MG 24 hr tablet    Sig: Take 1 tablet (60 mg total) by mouth daily.    Dispense:  90 tablet    Refill:  1  . simvastatin (ZOCOR) 10 MG tablet    Sig: Take 1 tablet (10 mg total) by mouth daily.    Dispense:  90 tablet    Refill:  1     Follow-up: No follow-ups on file.  [name], M.D.

## 2020-07-27 NOTE — Telephone Encounter (Signed)
Please send a new prescription for patient's insulin to [facility] with the correct dosing instructions that were changed to 20 units BID today

## 2020-07-28 LAB — CBC WITH DIFFERENTIAL/PLATELET
Basophils Absolute: 0.1 10*3/uL (ref 0.0–0.2)
Basos: 1 %
EOS (ABSOLUTE): 0.4 10*3/uL (ref 0.0–0.4)
Eos: 5 %
Hematocrit: 30 % — ABNORMAL LOW (ref 34.0–46.6)
Hemoglobin: 10 g/dL — ABNORMAL LOW (ref 11.1–15.9)
Immature Grans (Abs): 0 10*3/uL (ref 0.0–0.1)
Immature Granulocytes: 0 %
Lymphocytes Absolute: 1.8 10*3/uL (ref 0.7–3.1)
Lymphs: 24 %
MCH: 28.3 pg (ref 26.6–33.0)
MCHC: 33.3 g/dL (ref 31.5–35.7)
MCV: 85 fL (ref 79–97)
Monocytes Absolute: 0.6 10*3/uL (ref 0.1–0.9)
Monocytes: 8 %
Neutrophils Absolute: 4.7 10*3/uL (ref 1.4–7.0)
Neutrophils: 62 %
Platelets: 328 10*3/uL (ref 150–450)
RBC: 3.53 x10E6/uL — ABNORMAL LOW (ref 3.77–5.28)
RDW: 12.9 % (ref 11.7–15.4)
WBC: 7.5 10*3/uL (ref 3.4–10.8)

## 2020-07-28 LAB — LIPID PANEL
Chol/HDL Ratio: 2.6 ratio (ref 0.0–4.4)
Cholesterol, Total: 125 mg/dL (ref 100–199)
HDL: 49 mg/dL (ref >39)
LDL Chol Calc (NIH): 56 mg/dL (ref 0–99)
Triglycerides: 111 mg/dL (ref 0–149)
VLDL Cholesterol Cal: 20 mg/dL (ref 5–40)

## 2020-07-28 LAB — CMP14+EGFR
ALT: 11 IU/L (ref 0–32)
AST: 14 IU/L (ref 0–40)
Albumin/Globulin Ratio: 1.6 (ref 1.2–2.2)
Albumin: 4 g/dL (ref 3.7–4.7)
Alkaline Phosphatase: 107 IU/L (ref 44–121)
BUN/Creatinine Ratio: 30 — ABNORMAL HIGH (ref 12–28)
BUN: 66 mg/dL — ABNORMAL HIGH (ref 8–27)
Bilirubin Total: 0.2 mg/dL (ref 0.0–1.2)
CO2: 18 mmol/L — ABNORMAL LOW (ref 20–29)
Calcium: 9.7 mg/dL (ref 8.7–10.3)
Chloride: 103 mmol/L (ref 96–106)
Creatinine, Ser: 2.2 mg/dL — ABNORMAL HIGH (ref 0.57–1.00)
GFR calc Af Amer: 25 mL/min/{1.73_m2} — ABNORMAL LOW (ref >59)
GFR calc non Af Amer: 22 mL/min/{1.73_m2} — ABNORMAL LOW (ref >59)
Globulin, Total: 2.5 g/dL (ref 1.5–4.5)
Glucose: 141 mg/dL — ABNORMAL HIGH (ref 65–99)
Potassium: 4.6 mmol/L (ref 3.5–5.2)
Sodium: 137 mmol/L (ref 134–144)
Total Protein: 6.5 g/dL (ref 6.0–8.5)

## 2020-07-28 LAB — THYROID PANEL WITH TSH
Free Thyroxine Index: 4.1 (ref 1.2–4.9)
T3 Uptake Ratio: 35 % (ref 24–39)
T4, Total: 11.8 ug/dL (ref 4.5–12.0)
TSH: 0.014 u[IU]/mL — ABNORMAL LOW (ref 0.450–4.500)

## 2020-07-28 LAB — MICROALBUMIN / CREATININE URINE RATIO
Creatinine, Urine: 58.8 mg/dL
Microalb/Creat Ratio: <5 mg/g creat (ref 0–29)
Microalbumin, Urine: <3 ug/mL

## 2020-07-28 LAB — VITAMIN D 25 HYDROXY (VIT D DEFICIENCY, FRACTURES): Vit D, 25-Hydroxy: 31 ng/mL (ref 30.0–100.0)

## 2020-07-29 ENCOUNTER — Telehealth: Payer: Self-pay | Admitting: Family Medicine

## 2020-07-29 NOTE — Telephone Encounter (Signed)
Daughter aware of lab results per dpr.  Would like for Dr. Livia Snellen to review her TSH and advise if medication changed is needed.

## 2020-07-29 NOTE — Telephone Encounter (Signed)
I think she wold benefit, but her daughter stated that the higher dose caused hallucinations.

## 2020-07-29 NOTE — Telephone Encounter (Signed)
Daughter aware and states she would like the higher dose sent in.

## 2020-07-30 ENCOUNTER — Other Ambulatory Visit: Payer: Self-pay | Admitting: Family Medicine

## 2020-07-30 DIAGNOSIS — Z1231 Encounter for screening mammogram for malignant neoplasm of breast: Secondary | ICD-10-CM

## 2020-07-30 MED ORDER — LEVOTHYROXINE SODIUM 150 MCG PO TABS
150.0000 ug | ORAL_TABLET | Freq: Every day | ORAL | 1 refills | Status: DC
Start: 2020-07-30 — End: 2020-11-24

## 2020-07-30 NOTE — Telephone Encounter (Signed)
Please let the patient know that I sent their prescription to their pharmacy. Thanks, WS 

## 2020-08-02 NOTE — Telephone Encounter (Signed)
Patients daughter aware

## 2020-08-11 ENCOUNTER — Other Ambulatory Visit: Payer: Medicare Other

## 2020-08-11 ENCOUNTER — Other Ambulatory Visit: Payer: Self-pay

## 2020-08-11 DIAGNOSIS — I10 Essential (primary) hypertension: Secondary | ICD-10-CM | POA: Diagnosis not present

## 2020-08-11 LAB — CBC WITH DIFFERENTIAL/PLATELET
Basophils Absolute: 0.1 10*3/uL (ref 0.0–0.2)
Basos: 1 %
EOS (ABSOLUTE): 0.2 10*3/uL (ref 0.0–0.4)
Eos: 3 %
Hematocrit: 26.7 % — ABNORMAL LOW (ref 34.0–46.6)
Hemoglobin: 9 g/dL — ABNORMAL LOW (ref 11.1–15.9)
Immature Grans (Abs): 0 10*3/uL (ref 0.0–0.1)
Immature Granulocytes: 0 %
Lymphocytes Absolute: 1.5 10*3/uL (ref 0.7–3.1)
Lymphs: 17 %
MCH: 28.6 pg (ref 26.6–33.0)
MCHC: 33.7 g/dL (ref 31.5–35.7)
MCV: 85 fL (ref 79–97)
Monocytes Absolute: 0.6 10*3/uL (ref 0.1–0.9)
Monocytes: 7 %
Neutrophils Absolute: 6.2 10*3/uL (ref 1.4–7.0)
Neutrophils: 72 %
Platelets: 330 10*3/uL (ref 150–450)
RBC: 3.15 x10E6/uL — ABNORMAL LOW (ref 3.77–5.28)
RDW: 13.1 % (ref 11.7–15.4)
WBC: 8.5 10*3/uL (ref 3.4–10.8)

## 2020-08-11 LAB — BMP8+EGFR
BUN/Creatinine Ratio: 15 (ref 12–28)
BUN: 24 mg/dL (ref 8–27)
CO2: 19 mmol/L — ABNORMAL LOW (ref 20–29)
Calcium: 9.7 mg/dL (ref 8.7–10.3)
Chloride: 107 mmol/L — ABNORMAL HIGH (ref 96–106)
Creatinine, Ser: 1.6 mg/dL — ABNORMAL HIGH (ref 0.57–1.00)
GFR calc Af Amer: 37 mL/min/{1.73_m2} — ABNORMAL LOW (ref >59)
GFR calc non Af Amer: 32 mL/min/{1.73_m2} — ABNORMAL LOW (ref >59)
Glucose: 92 mg/dL (ref 65–99)
Potassium: 4.4 mmol/L (ref 3.5–5.2)
Sodium: 140 mmol/L (ref 134–144)

## 2020-08-11 NOTE — Telephone Encounter (Signed)
This encounter was created in error - please disregard.

## 2020-08-17 ENCOUNTER — Other Ambulatory Visit: Payer: Self-pay

## 2020-08-17 DIAGNOSIS — Z794 Long term (current) use of insulin: Secondary | ICD-10-CM

## 2020-08-17 DIAGNOSIS — E162 Hypoglycemia, unspecified: Secondary | ICD-10-CM

## 2020-08-17 DIAGNOSIS — I10 Essential (primary) hypertension: Secondary | ICD-10-CM

## 2020-08-17 DIAGNOSIS — E785 Hyperlipidemia, unspecified: Secondary | ICD-10-CM

## 2020-09-03 ENCOUNTER — Ambulatory Visit (INDEPENDENT_AMBULATORY_CARE_PROVIDER_SITE_OTHER): Payer: Medicare Other | Admitting: *Deleted

## 2020-09-03 VITALS — Ht 62.5 in | Wt 123.9 lb

## 2020-09-03 DIAGNOSIS — Z0000 Encounter for general adult medical examination without abnormal findings: Secondary | ICD-10-CM

## 2020-09-03 NOTE — Progress Notes (Signed)
MEDICARE ANNUAL WELLNESS VISIT  09/03/2020  Telephone Visit Disclaimer This Medicare AWV was conducted by telephone due to national recommendations for restrictions regarding the COVID-19 Pandemic (e.g. social distancing).  I verified, using two identifiers, that I am speaking with Brianna Weber or their authorized healthcare agent. I discussed the limitations, risks, security, and privacy concerns of performing an evaluation and management service by telephone and the potential availability of an in-person appointment in the future. The patient expressed understanding and agreed to proceed.  Location of Patient: Home Location of Provider (nurse):  Office  Subjective:    Brianna Weber is a 74 y.o. female patient of [name], [name], MD who had a Medicare Annual Wellness Visit today via telephone. Brianna Weber is Retired and lives alone. she has 4 children. she reports that she is socially active and does interact with friends/family regularly. she is not physically active and enjoys sewing.  Patient Care Team: [name], MD as PCP - General (Family Medicine) Ilean China, RN as Registered Nurse  Advanced Directives 09/03/2020 09/02/2019 08/27/2018 08/26/2018 10/02/2016 09/16/2014  Does Patient Have a Medical Advance Directive? Yes Yes Yes Yes Yes Yes  Type of Paramedic of [place];Living will Living will Living will Living will Living will;Healthcare Power of Attorney -  Does patient want to make changes to medical advance directive? No - Patient declined No - Patient declined No - Patient declined - - -  Copy of [place] in Chart? No - copy requested - - - No - copy requested No - copy requested  Would patient like information on creating a medical advance directive? - - No - Patient declined No - Patient declined - [facility] Utilization Over the Past 12 Months: # of hospitalizations or ER visits: 0 # of surgeries:  0  Review of Systems    Patient reports that her overall health is unchanged compared to last year.  History obtained from chart review and the patient General ROS: negative  Patient Reported Readings (BP, Pulse, CBG, Weight, etc) none  Pain Assessment Pain : No/denies pain     Current Medications & Allergies (verified) Allergies as of 09/03/2020   No Known Allergies     Medication List       Accurate as of September 03, 2020 10:10 AM. If you have any questions, ask your nurse or doctor.        Allergy Relief 25 MG tablet Generic drug: diphenhydrAMINE Take 25 mg by mouth daily.   amLODipine 10 MG tablet Commonly known as: NORVASC Take 1 tablet (10 mg total) by mouth daily.   aspirin 81 MG tablet Take 81 mg by mouth daily.   benazepril 20 MG tablet Commonly known as: LOTENSIN Take 1 tablet (20 mg total) by mouth daily.   furosemide 40 MG tablet Commonly known as: LASIX Take 1 tablet (40 mg total) by mouth daily.   isosorbide mononitrate 60 MG 24 hr tablet Commonly known as: IMDUR Take 1 tablet (60 mg total) by mouth daily.   levothyroxine 150 MCG tablet Commonly known as: SYNTHROID Take 1 tablet (150 mcg total) by mouth daily before breakfast.   NovoLIN 70/30 (70-30) 100 UNIT/ML injection Generic drug: insulin NPH-regular Human Inject 20 Units into the skin 2 (two) times daily with a meal. With breakfast and supper   OneTouch Ultra test strip Generic drug: glucose blood Test blood sugar twice daily   simvastatin 10 MG tablet Commonly  known as: ZOCOR Take 1 tablet (10 mg total) by mouth daily.   vitamin B-12 500 MCG tablet Commonly known as: CYANOCOBALAMIN Take 500 mcg by mouth daily.   vitamin E 200 UNIT capsule Generic drug: vitamin E Take 200 Units by mouth daily.       History (reviewed): Past Medical History:  Diagnosis Date  . Allergy   . Arrhythmia    bradycardia  . Breast cancer [facility])    s/p left mastectomy  . Diabetes mellitus    . Diverticulosis of colon   . Hyperlipidemia   . Hypertension   . NSTEMI (non-ST elevated myocardial infarction) ([place])    2004 secondary to stress induced cardiomyopathy. Normal LV function by repeatr echo.  . Peripheral edema   . Thyroid disease    hypothyroidism  . Vitamin D deficiency    Past Surgical History:  Procedure Laterality Date  . ABDOMINAL HYSTERECTOMY    . CHOLECYSTECTOMY    . MASTECTOMY     left   Family History  Problem Relation Age of Onset  . Hypertension Mother   . Heart disease Mother   . Heart attack Father   . Heart disease Sister   . Heart disease Brother   . Cancer Maternal Aunt        breast   . Heart disease Maternal Aunt   . Hyperlipidemia Maternal Aunt   . Hypertension Maternal Aunt   . Heart disease Maternal Uncle   . Hyperlipidemia Maternal Uncle   . Hypertension Maternal Uncle   . Heart disease Maternal Grandmother   . Heart disease Sister   . Heart disease Sister   . Heart disease Brother   . Diabetes Daughter   . Hypertension Other    Social History   Socioeconomic History  . Marital status: Widowed    Spouse name: Not on file  . Number of children: 4  . Years of education: 9  . Highest education level: 9th grade  Occupational History  . Occupation: retired  Tobacco Use  . Smoking status: Never Smoker  . Smokeless tobacco: Never Used  Vaping Use  . Vaping Use: Never used  Substance and Sexual Activity  . Alcohol use: No  . Drug use: No  . Sexual activity: Not Currently    Birth control/protection: Surgical  Other Topics Concern  . 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 Difficulty of Paying Living  Expenses: Not hard at all  Food Insecurity: No Food Insecurity  . Worried About Charity fundraiser in the Last Year: Never true  . Ran Out of Food in the Last Year: Never true  Transportation Needs: No Transportation Needs  . Lack of Transportation (Medical): No  . Lack of Transportation (Non-Medical): No  Physical Activity: Insufficiently Active  . Days of Exercise per Week: 4 days  . Minutes of Exercise per Session: 10 min  Stress: No Stress Concern Present  . Feeling of Stress : Not at all  Social Connections: Socially Isolated  . Frequency of Communication with Friends and Family: More than three times a  week  . Frequency of Social Gatherings with Friends and Family: More than three times a week  . Attends Religious Services: Never  . Active Member of Clubs or Organizations: No  . Attends Archivist Meetings: Never  . Marital Status: Widowed    Activities of Daily Living In your present state of health, do you have any difficulty performing the following activities: 09/03/2020 02/24/2020  Hearing? N N  Vision? N N  Difficulty concentrating or making decisions? Y N  Walking or climbing stairs? N N  Dressing or bathing? N N  Doing errands, shopping? Y N  Preparing Food and eating ? N N  Using the Toilet? N N  In the past six months, have you accidently leaked urine? Y N  Do you have problems with loss of bowel control? N N  Managing your Medications? N N  Managing your Finances? N N  Housekeeping or managing your Housekeeping? N N  Some recent data might be hidden    Patient Education/ Literacy How often do you need to have someone help you when you read instructions, pamphlets, or other written materials from your doctor or pharmacy?: 1 - Never What is the last grade level you completed in school?: 9th Grade  Exercise Current Exercise Habits: Home exercise routine, Type of exercise: walking, Time (Minutes): 10, Frequency (Times/Week): 4, Weekly Exercise  (Minutes/Week): 40, Intensity: Mild  Diet Patient reports consuming 3 meals a day and 1 snack(s) a day Patient reports that her primary diet is: Regular Patient reports that she does have regular access to food.   Depression Screen PHQ 2/9 Scores 07/27/2020 09/02/2019 01/27/2019 09/16/2018 08/30/2018 07/17/2018 03/19/2018  PHQ - 2 Score 0 0 2 2 2  0 1  PHQ- 9 Score - - 3 2 5  - -     Fall Risk Fall Risk  09/03/2020 07/27/2020 09/02/2019 06/04/2019 01/27/2019  Falls in the past year? 0 0 0 0 0  Comment - - - - -  Number falls in past yr: 0 - 0 0 -  Injury with Fall? 0 - 0 0 -  Risk for fall due to : No Fall Risks - Mental status change - -  Risk for fall due to: Comment - - pt has mild cognitive impairment which she follows up with Dr Delice Lesch for - -  Follow up Falls evaluation completed Falls evaluation completed Falls prevention discussed Falls evaluation completed -  Comment - - Get rid of all throw rugs in the house, adequate lighting in the walkways and grab bars in the bathroom - -     Objective:  Brianna Weber seemed alert and oriented and she participated appropriately during our telephone visit.  Blood Pressure Weight BMI  BP Readings from Last 3 Encounters:  07/27/20 (!) 119/56  01/27/19 111/67  11/15/18 128/62   Wt Readings from Last 3 Encounters:  09/03/20 123 lb 14.4 oz (56.2 kg)  07/27/20 124 lb (56.2 kg)  06/04/19 133 lb 6 oz (60.5 kg)   BMI Readings from Last 1 Encounters:  09/03/20 22.30 kg/m    *Unable to obtain current vital signs, weight, and BMI due to telephone visit type  Hearing/Vision  . Brianna Weber did  seem to have difficulty with hearing/understanding during the telephone conversation . Reports that she has not had a formal eye exam by an eye care professional within the past year . Reports that she has not had a formal hearing evaluation within the past year *Unable to fully  assess hearing and vision during telephone visit type  Cognitive  Function: 6CIT Screen 09/03/2020 09/02/2019  What Year? 0 points 0 points  What month? 0 points 0 points  What time? 0 points 0 points  Count back from 20 - 0 points  Months in reverse 4 points 4 points  Repeat phrase 10 points 6 points  Total Score - 10   (Normal:0-7, Significant for Dysfunction: >8)  Normal Cognitive Function Screening: No:    Immunization & Health Maintenance Record Immunization History  Administered Date(s) Administered  . Influenza, High Dose Seasonal PF 09/06/2017  . Influenza,inj,Quad PF,6+ Mos 09/16/2014, 08/12/2015, 08/22/2016  . Pneumococcal Conjugate-13 12/25/2014  . Pneumococcal Polysaccharide-23 03/17/2013    Health Maintenance  Topic Date Due  . Hepatitis C Screening  Never done  . COVID-19 Vaccine (1) Never done  . DEXA SCAN  03/25/2017  . FOOT EXAM  12/17/2018  . MAMMOGRAM  02/08/2019  . INFLUENZA VACCINE  06/13/2020  . OPHTHALMOLOGY EXAM  07/15/2020  . HEMOGLOBIN A1C  01/24/2021  . COLONOSCOPY  12/25/2021  . TETANUS/TDAP  11/01/2023  . PNA vac Low Risk Adult  Completed       Assessment  This is a routine wellness examination for Brianna Weber.  Health Maintenance: Due or Overdue Health Maintenance Due  Topic Date Due  . Hepatitis C Screening  Never done  . COVID-19 Vaccine (1) Never done  . DEXA SCAN  03/25/2017  . FOOT EXAM  12/17/2018  . MAMMOGRAM  02/08/2019  . INFLUENZA VACCINE  06/13/2020  . OPHTHALMOLOGY EXAM  07/15/2020    Brianna Weber does not need a referral for Community Assistance: Care Management:   no Social Work:    no Prescription Assistance:  no Nutrition/Diabetes Education:  no   Plan:  Personalized Goals Goals Addressed            This Visit's Progress   . Exercise 150 min/wk Moderate Activity       09/03/2020 AWV Goal: Exercise for General Health   Patient will verbalize understanding of the benefits of increased physical activity:  Exercising regularly is important. It will  improve your overall fitness, flexibility, and endurance.  Regular exercise also will improve your overall health. It can help you control your weight, reduce stress, and improve your bone density.  Over the next year, patient will increase physical activity as tolerated with a goal of at least 150 minutes of moderate physical activity per week.   You can tell that you are exercising at a moderate intensity if your heart starts beating faster and you start breathing faster but can still hold a conversation.  Moderate-intensity exercise ideas include:  Walking 1 mile (1.6 km) in about 15 minutes  Biking  Hiking  Golfing  Dancing  Water aerobics  Patient will verbalize understanding of everyday activities that increase physical activity by providing examples like the following: ? Yard work, such as: ? Pushing a Conservation officer, nature ? Raking and bagging leaves ? Washing your car ? Pushing a stroller ? Shoveling snow ? Gardening ? Washing windows or floors  Patient will be able to explain general safety guidelines for exercising:   Before you start a new exercise program, talk with your health care provider.  Do not exercise so much that you hurt yourself, feel dizzy, or get very short of breath.  Wear comfortable clothes and wear shoes with good support.  Drink plenty of water while you exercise to prevent dehydration or heat stroke.  Work out until your breathing and your heartbeat get faster.       Personalized Health Maintenance & Screening Recommendations  Influenza vaccine Screening mammography Screening Pap smear and pelvic exam  Bone densitometry screening Colorectal cancer screening Diabetes screening Glaucoma screening  Lung Cancer Screening Recommended: no (Low Dose CT Chest recommended if Age 55-80 years, 30 pack-year currently smoking OR have quit w/in past 15 years) Hepatitis C Screening recommended: no HIV Screening recommended: no  Advanced Directives:  Written information was not prepared per patient's request.  Referrals & Orders No orders of the defined types were placed in this encounter.   Follow-up Plan . Follow-up with [name], MD as planned    I have personally reviewed and noted the following in the patient's chart:   . Medical and social history . Use of alcohol, tobacco or illicit drugs  . Current medications and supplements . Functional ability and status . Nutritional status . Physical activity . Advanced directives . List of other physicians . Hospitalizations, surgeries, and ER visits in previous 12 months . Vitals . Screenings to include cognitive, depression, and falls . Referrals and appointments  In addition, I have reviewed and discussed with Brianna Weber certain preventive protocols, quality metrics, and best practice recommendations. A written personalized care plan for preventive services as well as general preventive health recommendations is available and can be mailed to the patient at her request.      Wardell Heath, LPN  [id number]  AVS printed and mailed to patient

## 2020-09-03 NOTE — Patient Instructions (Signed)
  [place] Maintenance Summary and Written Plan of Care  [name] ,  Thank you for allowing me to perform your Medicare Annual Wellness Visit and for your ongoing commitment to your health.   Health Maintenance & Immunization History Health Maintenance  Topic Date Due  . Hepatitis C Screening  Never done  . COVID-19 Vaccine (1) Never done  . DEXA SCAN  03/25/2017  . FOOT EXAM  12/17/2018  . MAMMOGRAM  02/08/2019  . INFLUENZA VACCINE  06/13/2020  . OPHTHALMOLOGY EXAM  07/15/2020  . HEMOGLOBIN A1C  01/24/2021  . COLONOSCOPY  12/25/2021  . TETANUS/TDAP  11/01/2023  . PNA vac Low Risk Adult  Completed   Immunization History  Administered Date(s) Administered  . Influenza, High Dose Seasonal PF 09/06/2017  . Influenza,inj,Quad PF,6+ Mos 09/16/2014, 08/12/2015, 08/22/2016  . Pneumococcal Conjugate-13 12/25/2014  . Pneumococcal Polysaccharide-23 03/17/2013    These are the patient goals that we discussed: Goals Addressed            This Visit's Progress   . Exercise 150 min/wk Moderate Activity       09/03/2020 AWV Goal: Exercise for General Health   Patient will verbalize understanding of the benefits of increased physical activity:  Exercising regularly is important. It will improve your overall fitness, flexibility, and endurance.  Regular exercise also will improve your overall health. It can help you control your weight, reduce stress, and improve your bone density.  Over the next year, patient will increase physical activity as tolerated with a goal of at least 150 minutes of moderate physical activity per week.   You can tell that you are exercising at a moderate intensity if your heart starts beating faster and you start breathing faster but can still hold a conversation.  Moderate-intensity exercise ideas include:  Walking 1 mile (1.6 km) in about 15 minutes  Biking  Hiking  Golfing  Dancing  Water aerobics  Patient  will verbalize understanding of everyday activities that increase physical activity by providing examples like the following: ? Yard work, such as: ? Pushing a Conservation officer, nature ? Raking and bagging leaves ? Washing your car ? Pushing a stroller ? Shoveling snow ? Gardening ? Washing windows or floors  Patient will be able to explain general safety guidelines for exercising:   Before you start a new exercise program, talk with your health care provider.  Do not exercise so much that you hurt yourself, feel dizzy, or get very short of breath.  Wear comfortable clothes and wear shoes with good support.  Drink plenty of water while you exercise to prevent dehydration or heat stroke.  Work out until your breathing and your heartbeat get faster.         This is a list of Health Maintenance Items that are overdue or due now: Health Maintenance Due  Topic Date Due  . Hepatitis C Screening  Never done  . COVID-19 Vaccine (1) Never done  . DEXA SCAN  03/25/2017  . FOOT EXAM  12/17/2018  . MAMMOGRAM  02/08/2019  . INFLUENZA VACCINE  06/13/2020  . OPHTHALMOLOGY EXAM  07/15/2020     Orders/Referrals Placed Today: No orders of the defined types were placed in this encounter.  (Contact our referral department at [phone] if you have not spoken with someone about your referral appointment within the next 5 days)

## 2020-09-21 ENCOUNTER — Other Ambulatory Visit: Payer: Medicare Other

## 2020-09-21 ENCOUNTER — Other Ambulatory Visit: Payer: Self-pay

## 2020-09-21 ENCOUNTER — Ambulatory Visit
Admission: RE | Admit: 2020-09-21 | Discharge: 2020-09-21 | Disposition: A | Discharge status: Home / Self Care | Payer: Medicare Other | Source: Ambulatory Visit | Attending: Family Medicine | Admitting: Family Medicine

## 2020-09-21 DIAGNOSIS — E162 Hypoglycemia, unspecified: Secondary | ICD-10-CM | POA: Diagnosis not present

## 2020-09-21 DIAGNOSIS — Z794 Long term (current) use of insulin: Secondary | ICD-10-CM

## 2020-09-21 DIAGNOSIS — I10 Essential (primary) hypertension: Secondary | ICD-10-CM | POA: Diagnosis not present

## 2020-09-21 DIAGNOSIS — E785 Hyperlipidemia, unspecified: Secondary | ICD-10-CM

## 2020-09-21 DIAGNOSIS — Z1231 Encounter for screening mammogram for malignant neoplasm of breast: Secondary | ICD-10-CM | POA: Diagnosis not present

## 2020-09-21 DIAGNOSIS — E119 Type 2 diabetes mellitus without complications: Secondary | ICD-10-CM | POA: Diagnosis not present

## 2020-09-21 LAB — BMP8+EGFR
BUN/Creatinine Ratio: 14 (ref 12–28)
BUN: 20 mg/dL (ref 8–27)
CO2: 21 mmol/L (ref 20–29)
Calcium: 10.1 mg/dL (ref 8.7–10.3)
Chloride: 106 mmol/L (ref 96–106)
Creatinine, Ser: 1.43 mg/dL — ABNORMAL HIGH (ref 0.57–1.00)
GFR calc Af Amer: 42 mL/min/{1.73_m2} — ABNORMAL LOW (ref >59)
GFR calc non Af Amer: 37 mL/min/{1.73_m2} — ABNORMAL LOW (ref >59)
Glucose: 104 mg/dL — ABNORMAL HIGH (ref 65–99)
Potassium: 4.1 mmol/L (ref 3.5–5.2)
Sodium: 141 mmol/L (ref 134–144)

## 2020-09-21 LAB — CBC WITH DIFFERENTIAL/PLATELET
Basophils Absolute: 0.1 10*3/uL (ref 0.0–0.2)
Basos: 1 %
EOS (ABSOLUTE): 0.7 10*3/uL — ABNORMAL HIGH (ref 0.0–0.4)
Eos: 7 %
Hematocrit: 32.9 % — ABNORMAL LOW (ref 34.0–46.6)
Hemoglobin: 10.5 g/dL — ABNORMAL LOW (ref 11.1–15.9)
Immature Grans (Abs): 0 10*3/uL (ref 0.0–0.1)
Immature Granulocytes: 0 %
Lymphocytes Absolute: 1.9 10*3/uL (ref 0.7–3.1)
Lymphs: 20 %
MCH: 27.9 pg (ref 26.6–33.0)
MCHC: 31.9 g/dL (ref 31.5–35.7)
MCV: 88 fL (ref 79–97)
Monocytes Absolute: 0.6 10*3/uL (ref 0.1–0.9)
Monocytes: 7 %
Neutrophils Absolute: 5.9 10*3/uL (ref 1.4–7.0)
Neutrophils: 65 %
Platelets: 370 10*3/uL (ref 150–450)
RBC: 3.76 x10E6/uL — ABNORMAL LOW (ref 3.77–5.28)
RDW: 12.6 % (ref 11.7–15.4)
WBC: 9.2 10*3/uL (ref 3.4–10.8)

## 2020-09-28 ENCOUNTER — Other Ambulatory Visit: Payer: Self-pay | Admitting: Family Medicine

## 2020-09-29 ENCOUNTER — Ambulatory Visit: Payer: Medicare Other

## 2020-09-29 DIAGNOSIS — Z794 Long term (current) use of insulin: Secondary | ICD-10-CM

## 2020-09-29 DIAGNOSIS — E119 Type 2 diabetes mellitus without complications: Secondary | ICD-10-CM

## 2020-09-29 DIAGNOSIS — I10 Essential (primary) hypertension: Secondary | ICD-10-CM

## 2020-09-29 NOTE — Chronic Care Management (AMB) (Signed)
Chronic Care Management   Follow Up Note   09/29/2020 Name: Brianna Weber MRN: [id number] DOB: 08/27/1946  Referred by: Brianna Fraise, MD Reason for referral : Chronic Care Management (RN Follow up)   Brianna Weber is a 74 y.o. year old female who is a primary care patient of [name], [name], MD. The CCM team was consulted for assistance with chronic disease management and care coordination needs.    Review of patient status, including review of consultants reports, relevant laboratory and other test results, and collaboration with appropriate care team members and the patient's provider was performed as part of comprehensive patient evaluation and provision of chronic care management services.    SDOH (Social Determinants of Health) assessments performed: No See Care Plan activities for detailed interventions related to [name])     Outpatient Encounter Medications as of 09/29/2020  Medication Sig Note  . amLODipine (NORVASC) 10 MG tablet Take 1 tablet (10 mg total) by mouth daily.   Marland Kitchen aspirin 81 MG tablet Take 81 mg by mouth daily.     . isosorbide mononitrate (IMDUR) 60 MG 24 hr tablet Take 1 tablet (60 mg total) by mouth daily.   Marland Kitchen levothyroxine (SYNTHROID) 150 MCG tablet Take 1 tablet (150 mcg total) by mouth daily before breakfast.   . simvastatin (ZOCOR) 10 MG tablet Take 1 tablet (10 mg total) by mouth daily.   . vitamin B-12 (CYANOCOBALAMIN) 500 MCG tablet Take 500 mcg by mouth daily.   . vitamin E (VITAMIN E) 200 UNIT capsule Take 200 Units by mouth daily.   . benazepril (LOTENSIN) 20 MG tablet Take 1 tablet (20 mg total) by mouth daily. (Patient not taking: Reported on 09/29/2020) 09/29/2020: 09/29/2020 being held due to decreased kidney function  . diphenhydrAMINE (ALLERGY RELIEF) 25 MG tablet Take 25 mg by mouth daily.    . furosemide (LASIX) 40 MG tablet Take 1 tablet (40 mg total) by mouth daily. (Patient not taking: Reported on 09/29/2020) 09/29/2020: 09/29/2020  Being held due to decreased kidney function  . glucose blood (ONETOUCH ULTRA) test strip CHECK BLOOD SUGAR 2 TIMES A DAY Dx E11.9   . insulin NPH-regular Human (NOVOLIN 70/30) (70-30) 100 UNIT/ML injection Inject 20 Units into the skin 2 (two) times daily with a meal. With breakfast and supper (Patient taking differently: Inject 10 Units into the skin 2 (two) times daily with a meal. With breakfast and supper)    No facility-administered encounter medications on file as of 09/29/2020.     Lab Results  Component Value Date   HGBA1C 5.8 07/27/2020   HGBA1C 5.8 04/23/2020   HGBA1C 6.5 01/27/2019   Lab Results  Component Value Date   MICROALBUR neg 09/16/2014   LDLCALC 56 07/27/2020   CREATININE 1.43 (H) 09/21/2020   BP Readings from Last 3 Encounters:  07/27/20 (!) 119/56  01/27/19 111/67  11/15/18 128/62    Goals Addressed              This Visit's Progress     Patient Stated   .  "I want to keep my blood sugar under control" (pt-stated)        CARE PLAN ENTRY (see longitudinal plan of care for additional care plan information)  Current Barriers:  . Chronic Disease Management support and education needs related to diabetes. . Inconsistent with meals  Nurse Case Manager Clinical Goal(s):  Marland Kitchen Over the next 45 days, patient will work with Consulting civil engineer to address needs related to diabetes .  Over the next 60 days, patient will monitor and record blood sugar 2 to 3 times daily and call PCP with any readings outside of recommended range  Interventions:  . Inter-disciplinary care team collaboration (see longitudinal plan of care) . Chart reviewed including recent office notes and lab results . Discussed recent lab results including kidney functions, hgb, and A1C . Discussed home blood sugar readings and management o Checking blood sugar 2-3 times a day o Has had a few episodes of symptomatic hypoglycemia at night o One CBG of 38. Daughter noticed she wasn't acting right  and gave her a peanut butter sandwich which brought CBG up.  . Reviewed and discussed medications with patient/daughter o Was taking 20 units of 70/30 insulin BID with meals. Has decreased to 10 units BID with meals because of nighttime hypoglycemia . Discussed diet and meal planning o Daughter is staying with her 3 weeks a month and is preparing 3 meals a day o Eating cereal, fruit, and Glucerna shake for breakfast . Discussed that blood sugar readings having been much more stable since eating more regularly and decreasing insulin to 10 units BID o 127 fasting yesterday and 136 last night . Updated medication list to reflect current insulin dose . RNCM will collaborate with PCP and PharmD regarding insulin and whether any changes are needed to avoid hypoglycemia.  o A1C of 5.8 in Sept shows pretty tight blood sugar control . Reviewed and discussed future appt with Dr Livia Snellen in 01/2020 . Recommended to reach out to PCP with any new or worsening problems and with any blood sugar readings outside of recommended range . Provided with RNCM contact number and encouraged to reach out as needed  Patient Self Care Activities:  . Performs ADL's independently . Performs IADL's independently  Please see past updates related to this goal by clicking on the "Past Updates" button in the selected goal        Other   .  RN Care Manager Goal: Blood Pressure Control        CARE PLAN ENTRY (see longitudinal plan of care for additional care plan information)  Current Barriers:  . Chronic Disease Management support and education needs related to hypertension . Benazepril and Lasix being held due to decreased kidney function and related anemia  . Not checking blood pressure regularly at home  Nurse Case Manager Clinical Goal(s):  Marland Kitchen Over the next 30 days, patient/daughter will check and record blood pressure daily and call PCP with any readings outside of recommended range . Over the next 7 days,  patient/daughter will talk with RN Care Manager regarding home blood pressure readings  Interventions:  . Inter-disciplinary care team collaboration (see longitudinal plan of care) . Chart reviewed including recent office notes and lab results . Reviewed and discussed recent kidney function and hgb results o Both are stable . Reviewed and discussed recent in office blood pressure results . Medications reviewed and discussed o Instructed to hold benazepril and lasix in Sept due to decreased kidney functions o Has not restarted o Taking amlodipine as instructed . Questioned about lower extremity edema and patient/daughter report no increase in swelling since d/c lasix. Edema has improved since switching to a different type of sock.  . Discussed home blood pressure monitoring o Daughter brought a monitor to her home but has not been checking BP o Recommended that she check and record blood pressure daily at varying times . Documented on med list that patient is not taking benazepril  and lasix and that she is holding it due to elevated kidney functions . Encouraged patient/daughter to reach out to PCP with any readings outside of recommended range . RN Care Manger with f/u with patient/daughter over the next 7 days regarding home blood pressure readings . RNCM will collaborate with PCP after obtaining BP readings regarding whether she should continue to hold benazepril and lasix  Patient Self Care Activities:  . Performs ADL's independently . Has assistance from daughters with medication management  Initial goal documentation         Plan:   Telephone follow up appointment with care management team member scheduled for: 10/04/20 with RN Care Manager  The patient has been provided with contact information for the care management team and has been advised to call with any health related questions or concerns.   Follow up with provider re: blood pressure or blood sugar readings outside  of recommended range  Next PCP appointment scheduled for: 01/25/21 with Dr Livia Snellen   Chong Sicilian, BSN, RN-BC [place] [facility] [place] [facility] Direct Dial: [phone]

## 2020-09-29 NOTE — Patient Instructions (Signed)
Visit Information  Goals Addressed              This Visit's Progress     Patient Stated   .  "I want to keep my blood sugar under control" (pt-stated)        CARE PLAN ENTRY (see longitudinal plan of care for additional care plan information)  Current Barriers:  . Chronic Disease Management support and education needs related to diabetes. . Inconsistent with meals  Nurse Case Manager Clinical Goal(s):  Marland Kitchen Over the next 45 days, patient will work with Consulting civil engineer to address needs related to diabetes . Over the next 60 days, patient will monitor and record blood sugar 2 to 3 times daily and call PCP with any readings outside of recommended range  Interventions:  . Inter-disciplinary care team collaboration (see longitudinal plan of care) . Chart reviewed including recent office notes and lab results . Discussed recent lab results including kidney functions, hgb, and A1C . Discussed home blood sugar readings and management o Checking blood sugar 2-3 times a day o Has had a few episodes of symptomatic hypoglycemia at night o One CBG of 38. Daughter noticed she wasn't acting right and gave her a peanut butter sandwich which brought CBG up.  . Reviewed and discussed medications with patient/daughter o Was taking 20 units of 70/30 insulin BID with meals. Has decreased to 10 units BID with meals because of nighttime hypoglycemia . Discussed diet and meal planning o Daughter is staying with her 3 weeks a month and is preparing 3 meals a day o Eating cereal, fruit, and Glucerna shake for breakfast . Discussed that blood sugar readings having been much more stable since eating more regularly and decreasing insulin to 10 units BID o 127 fasting yesterday and 136 last night . Updated medication list to reflect current insulin dose . RNCM will collaborate with PCP and PharmD regarding insulin and whether any changes are needed to avoid hypoglycemia.  o A1C of 5.8 in Sept shows pretty  tight blood sugar control . Reviewed and discussed future appt with Dr Livia Snellen in 01/2020 . Recommended to reach out to PCP with any new or worsening problems and with any blood sugar readings outside of recommended range . Provided with RNCM contact number and encouraged to reach out as needed  Patient Self Care Activities:  . Performs ADL's independently . Performs IADL's independently  Please see past updates related to this goal by clicking on the "Past Updates" button in the selected goal        Other   .  RN Care Manager Goal: Blood Pressure Control        CARE PLAN ENTRY (see longitudinal plan of care for additional care plan information)  Current Barriers:  . Chronic Disease Management support and education needs related to hypertension . Benazepril and Lasix being held due to decreased kidney function and related anemia  . Not checking blood pressure regularly at home  Nurse Case Manager Clinical Goal(s):  Marland Kitchen Over the next 30 days, patient/daughter will check and record blood pressure daily and call PCP with any readings outside of recommended range . Over the next 7 days, patient/daughter will talk with RN Care Manager regarding home blood pressure readings  Interventions:  . Inter-disciplinary care team collaboration (see longitudinal plan of care) . Chart reviewed including recent office notes and lab results . Reviewed and discussed recent kidney function and hgb results o Both are stable . Reviewed and discussed  recent in office blood pressure results . Medications reviewed and discussed o Instructed to hold benazepril and lasix in Sept due to decreased kidney functions o Has not restarted o Taking amlodipine as instructed . Questioned about lower extremity edema and patient/daughter report no increase in swelling since d/c lasix. Edema has improved since switching to a different type of sock.  . Discussed home blood pressure monitoring o Daughter brought a monitor  to her home but has not been checking BP o Recommended that she check and record blood pressure daily at varying times . Documented on med list that patient is not taking benazepril and lasix and that she is holding it due to elevated kidney functions . Encouraged patient/daughter to reach out to PCP with any readings outside of recommended range . RN Care Manger with f/u with patient/daughter over the next 7 days regarding home blood pressure readings . RNCM will collaborate with PCP after obtaining BP readings regarding whether she should continue to hold benazepril and lasix  Patient Self Care Activities:  . Performs ADL's independently . Has assistance from daughters with medication management  Initial goal documentation        The patient verbalized understanding of instructions, educational materials, and care plan provided today and declined offer to receive copy of patient instructions, educational materials, and care plan.   Follow-up Plan:   Telephone follow up appointment with care management team member scheduled for: 10/04/20 with RN Care Manager  The patient has been provided with contact information for the care management team and has been advised to call with any health related questions or concerns.   Follow up with provider re: blood pressure or blood sugar readings outside of recommended range  Next PCP appointment scheduled for: 01/25/21 with Dr Livia Snellen   Chong Sicilian, BSN, RN-BC [place] [facility] [place] [facility] Direct Dial: [phone]

## 2020-09-30 ENCOUNTER — Ambulatory Visit: Payer: Medicare Other | Admitting: *Deleted

## 2020-09-30 DIAGNOSIS — I10 Essential (primary) hypertension: Secondary | ICD-10-CM

## 2020-09-30 DIAGNOSIS — Z794 Long term (current) use of insulin: Secondary | ICD-10-CM

## 2020-09-30 NOTE — Chronic Care Management (AMB) (Signed)
  Chronic Care Management   Follow Up Note   09/30/2020 Name: Brianna Weber MRN: [id number] DOB: 11-01-1946  Referred by: [name], MD Reason for referral : Chronic Care Management (RN follow up)   Brianna Weber is a 74 y.o. year old female who is a primary care patient of [name], [name], MD. The CCM team was consulted for assistance with chronic disease management and care coordination needs.    Collaborated with Dr Livia Snellen regarding patient's blood sugar management and hypoglycemic events. Decided that a referral to Lottie Dawson, PharmD would be helpful. Almyra Free updated on patient as well. I reached out to front office staff and requested that they contact patient's daughter, Brianna Weber, to schedule a CCM Pharm visit with Lottie Dawson, PharmD to review medications and made adjustments as needed.   Additional Goals Addressed    .  "I want to make sure my blood pressure is under control" (pt-stated)        CARE PLAN ENTRY (see longitudinal plan of care for additional care plan information)  Current Barriers:  . Chronic Disease Management support and education needs related to hypertension in a patient with kidney disease and diabetes  Nurse Case Manager Clinical Goal(s):  Marland Kitchen Over the next 7 days, patient will talk with RN Care Manager regarding home blood pressure readings . Over the next 7 days, patient will check and record blood pressure daily and call PCP with any readings outside of recommended range  Interventions:  . Inter-disciplinary care team collaboration (see longitudinal plan of care) . Chart reviewed including relevant office notes and lab results o Elevated kidney functions over last several blood draws . Reviewed and discussed medications o Amlodipine for BP control o Benazepril and lasix was held due to elev kidney functions. Patient is still not taking those . Encouraged patient/daughter to check and record blood pressure daily and to vary the  times . Discussed home readings o Yesterday at 2:30 pm it was 131/62 P 69 . Discussed that she does not have any worsening edema since d/c lasix but does have better bladder control . Encouraged patient/daughter to reach out to PCP with any readings outside of recommended range . Reviewed upcoming appointments o Patient to talk with Lottie Dawson, PharmD regarding blood sugar management  Patient Self Care Activities:  . Performs ADL's independently . Performs IADL's independently  Initial goal documentation        Follow up plan: Telephone follow up appointment with care management team member scheduled for: 10/05/20 with RN Care Manager Follow up with provider re: bood sugar management  Chong Sicilian, BSN, RN-BC [place] [facility] [place] [facility] Direct Dial: [phone]

## 2020-09-30 NOTE — Patient Instructions (Signed)
Visit Information  Goals Addressed              This Visit's Progress     Patient Stated   .  "I want to keep my blood sugar under control" (pt-stated)        CARE PLAN ENTRY (see longitudinal plan of care for additional care plan information)  Current Barriers:  . Chronic Disease Management support and education needs related to diabetes. . Inconsistent with meals  Nurse Case Manager Clinical Goal(s):  Marland Kitchen Over the next 45 days, patient will work with Consulting civil engineer to address needs related to diabetes . Over the next 60 days, patient will monitor and record blood sugar 2 to 3 times daily and call PCP with any readings outside of recommended range  Interventions:  . Inter-disciplinary care team collaboration (see longitudinal plan of care) . Chart reviewed including recent office notes and lab results . Discussed recent lab results including kidney functions, hgb, and A1C . Discussed home blood sugar readings and management o Checking blood sugar 2-3 times a day o Has had a few episodes of symptomatic hypoglycemia at night o One CBG of 38. Daughter noticed she wasn't acting right and gave her a peanut butter sandwich which brought CBG up.  . Reviewed and discussed medications with patient/daughter o Was taking 20 units of 70/30 insulin BID with meals. Has decreased to 10 units BID with meals because of nighttime hypoglycemia . Discussed diet and meal planning o Daughter is staying with her 3 weeks a month and is preparing 3 meals a day o Eating cereal, fruit, and Glucerna shake for breakfast . Discussed that blood sugar readings having been much more stable since eating more regularly and decreasing insulin to 10 units BID o 127 fasting yesterday and 136 last night . Updated medication list to reflect current insulin dose . Collaborated with PCP and PharmD regarding insulin and whether any changes are needed to avoid hypoglycemia.  o A1C of 5.8 in Sept shows pretty tight blood  sugar control . Reached out to front desk to have them contact daughter, Hoyle Sauer, to schedule CCM Pharm visit with Lottie Dawson, PharmD . Reviewed and discussed future appt with Dr Livia Snellen in 01/2020 . Recommended to reach out to PCP with any new or worsening problems and with any blood sugar readings outside of recommended range . Provided with RNCM contact number and encouraged to reach out as needed  Patient Self Care Activities:  . Performs ADL's independently . Performs IADL's independently  Please see past updates related to this goal by clicking on the "Past Updates" button in the selected goal      .  "I want to make sure my blood pressure is under control" (pt-stated)        CARE PLAN ENTRY (see longitudinal plan of care for additional care plan information)  Current Barriers:  . Chronic Disease Management support and education needs related to hypertension in a patient with kidney disease and diabetes  Nurse Case Manager Clinical Goal(s):  Marland Kitchen Over the next 7 days, patient will talk with RN Care Manager regarding home blood pressure readings . Over the next 7 days, patient will check and record blood pressure daily and call PCP with any readings outside of recommended range  Interventions:  . Inter-disciplinary care team collaboration (see longitudinal plan of care) . Chart reviewed including relevant office notes and lab results o Elevated kidney functions over last several blood draws . Reviewed and discussed medications  o Amlodipine for BP control o Benazepril and lasix was held due to elev kidney functions. Patient is still not taking those . Encouraged patient/daughter to check and record blood pressure daily and to vary the times . Discussed home readings o Yesterday at 2:30 pm it was 131/62 P 69 . Discussed that she does not have any worsening edema since d/c lasix but does have better bladder control . Encouraged patient/daughter to reach out to PCP with any readings  outside of recommended range . Reviewed upcoming appointments o Patient to talk with Lottie Dawson, PharmD regarding blood sugar management  Patient Self Care Activities:  . Performs ADL's independently . Performs IADL's independently  Initial goal documentation        The patient verbalized understanding of instructions, educational materials, and care plan provided today and declined offer to receive copy of patient instructions, educational materials, and care plan.    Follow up plan: Telephone follow up appointment with care management team member scheduled for: 10/05/20 with RN Care Manager Follow up with provider re: bood sugar management  Chong Sicilian, BSN, RN-BC [place] [facility] [place] [facility] Direct Dial: [phone]

## 2020-10-05 ENCOUNTER — Ambulatory Visit: Payer: Medicare Other | Admitting: *Deleted

## 2020-10-05 DIAGNOSIS — E119 Type 2 diabetes mellitus without complications: Secondary | ICD-10-CM

## 2020-10-05 DIAGNOSIS — Z794 Long term (current) use of insulin: Secondary | ICD-10-CM

## 2020-10-05 DIAGNOSIS — I10 Essential (primary) hypertension: Secondary | ICD-10-CM

## 2020-10-05 NOTE — Chronic Care Management (AMB) (Signed)
Chronic Care Management   Follow Up Note   10/05/2020 Name: Brianna Weber MRN: [id number] DOB: [birth year]  Referred by: [name], MD Reason for referral : Chronic Care Management (RN follow up)   Brianna Weber is a 74 y.o. year old female who is a primary care patient of [name], [name], MD. The CCM team was consulted for assistance with chronic disease management and care coordination needs.    Review of patient status, including review of consultants reports, relevant laboratory and other test results, and collaboration with appropriate care team members and the patient's provider was performed as part of comprehensive patient evaluation and provision of chronic care management services.    SDOH (Social Determinants of Health) assessments performed: No See Care Plan activities for detailed interventions related to [facility])     Outpatient Encounter Medications as of 10/05/2020  Medication Sig Note  . amLODipine (NORVASC) 10 MG tablet Take 1 tablet (10 mg total) by mouth daily.   Marland Kitchen aspirin 81 MG tablet Take 81 mg by mouth daily.     . benazepril (LOTENSIN) 20 MG tablet Take 1 tablet (20 mg total) by mouth daily. (Patient not taking: Reported on 09/29/2020) 09/29/2020: 09/29/2020 being held due to decreased kidney function  . diphenhydrAMINE (ALLERGY RELIEF) 25 MG tablet Take 25 mg by mouth daily.    . furosemide (LASIX) 40 MG tablet Take 1 tablet (40 mg total) by mouth daily. (Patient not taking: Reported on 09/29/2020) 09/29/2020: 09/29/2020 Being held due to decreased kidney function  . glucose blood (ONETOUCH ULTRA) test strip CHECK BLOOD SUGAR 2 TIMES A DAY Dx E11.9   . insulin NPH-regular Human (NOVOLIN 70/30) (70-30) 100 UNIT/ML injection Inject 20 Units into the skin 2 (two) times daily with a meal. With breakfast and supper (Patient taking differently: Inject 10 Units into the skin 2 (two) times daily with a meal. With breakfast and supper)   . isosorbide mononitrate  (IMDUR) 60 MG 24 hr tablet Take 1 tablet (60 mg total) by mouth daily.   Marland Kitchen levothyroxine (SYNTHROID) 150 MCG tablet Take 1 tablet (150 mcg total) by mouth daily before breakfast.   . simvastatin (ZOCOR) 10 MG tablet Take 1 tablet (10 mg total) by mouth daily.   . vitamin B-12 (CYANOCOBALAMIN) 500 MCG tablet Take 500 mcg by mouth daily.   . vitamin E (VITAMIN E) 200 UNIT capsule Take 200 Units by mouth daily.    No facility-administered encounter medications on file as of 10/05/2020.     Goals Addressed              This Visit's Progress     Patient Stated   .  "I want to keep my blood sugar under control" (pt-stated)        CARE PLAN ENTRY (see longitudinal plan of care for additional care plan information)  Current Barriers:  . Chronic Disease Management support and education needs related to diabetes. . Inconsistent with meals  Nurse Case Manager Clinical Goal(s):  Marland Kitchen Over the next 45 days, patient will work with Consulting civil engineer to address needs related to diabetes . Over the next 60 days, patient will monitor and record blood sugar 2 to 3 times daily and call PCP with any readings outside of recommended range . Over the next 2 days, patient will have a visit with clinical pharmacist to review medications and diabetes management  Interventions:  . Inter-disciplinary care team collaboration (see longitudinal plan of care) . Chart reviewed including recent  office notes and lab results . Discussed recent lab results including kidney functions, hgb, and A1C . Discussed home blood sugar readings and management o Checking blood sugar 2-3 times a day o Has had a few episodes of symptomatic hypoglycemia at night o One CBG of 38. Daughter noticed she wasn't acting right and gave her a peanut butter sandwich which brought CBG up.  . Reviewed and discussed medications with patient/daughter o Was taking 20 units of 70/30 insulin BID with meals. Has decreased to 10 units BID with meals  because of nighttime hypoglycemia . Discussed diet and meal planning o Daughter is staying with her 3 weeks a month and is preparing 3 meals a day o Eating cereal, fruit, and Glucerna shake for breakfast . Discussed that blood sugar readings having been much more stable since eating more regularly and decreasing insulin to 10 units BID . Reviewed and discussed future appt with Dr Livia Snellen in 01/2020 and with Lottie Dawson, PharmD tomorrow, 10/06/20 . Recommended to reach out to PCP with any new or worsening problems and with any blood sugar readings outside of recommended range . Provided with RNCM contact number and encouraged to reach out as needed  Patient Self Care Activities:  . Performs ADL's independently . Performs IADL's independently  Please see past updates related to this goal by clicking on the "Past Updates" button in the selected goal      .  "I want to make sure my blood pressure is under control" (pt-stated)        CARE PLAN ENTRY (see longitudinal plan of care for additional care plan information)  Current Barriers:  . Chronic Disease Management support and education needs related to hypertension in a patient with kidney disease and diabetes  Nurse Case Manager Clinical Goal(s):  Marland Kitchen Over the next 30 days, patient will talk with RN Care Manager regarding home blood pressure readings . Over the next 30 days, patient will check and record blood pressure daily and call PCP with any readings outside of recommended range  Interventions:  . Inter-disciplinary care team collaboration (see longitudinal plan of care) . Chart reviewed including relevant office notes and lab results o Elevated kidney functions over last several blood draws . Reviewed and discussed medications o Explained why she is taking Imdur o Amlodipine for BP control o Benazepril and lasix was held due to elev kidney functions. Patient is still not taking those . Discussed that daughter is organizing  medication in a pill box . Encouraged patient/daughter to check and record blood pressure daily and to vary the times . Discussed home readings o Yesterday 160/70 (after activity) o 10/03/20 131/62 during rest . Encouraged patient/daughter to reach out to PCP with any readings outside of recommended range . Reviewed upcoming appointments o Patient to talk with Lottie Dawson, PharmD regarding blood sugar management  Patient Self Care Activities:  . Performs ADL's independently . Performs IADL's independently  Please see past updates related to this goal by clicking on the "Past Updates" button in the selected goal           Plan:   The patient has been provided with contact information for the care management team and has been advised to call with any health related questions or concerns.  The care management team will reach out to the patient again over the next 30 days.  Telephone visit with Lottie Dawson, PharmD on 10/06/20   Chong Sicilian, BSN, RN-BC Embedded Chronic Care Manager [place]  [facility] [place] [facility] Direct Dial: [phone]

## 2020-10-05 NOTE — Patient Instructions (Signed)
Visit Information  Goals Addressed              This Visit's Progress     Patient Stated     "I want to keep my blood sugar under control" (pt-stated)        CARE PLAN ENTRY (see longitudinal plan of care for additional care plan information)  Current Barriers:   Chronic Disease Management support and education needs related to diabetes.  Inconsistent with meals  Nurse Case Manager Clinical Goal(s):   Over the next 45 days, patient will work with Consulting civil engineer to address needs related to diabetes  Over the next 60 days, patient will monitor and record blood sugar 2 to 3 times daily and call PCP with any readings outside of recommended range  Over the next 2 days, patient will have a visit with clinical pharmacist to review medications and diabetes management  Interventions:   Inter-disciplinary care team collaboration (see longitudinal plan of care)  Chart reviewed including recent office notes and lab results  Discussed recent lab results including kidney functions, hgb, and A1C  Discussed home blood sugar readings and management o Checking blood sugar 2-3 times a day o Has had a few episodes of symptomatic hypoglycemia at night o One CBG of 38. Daughter noticed she wasn't acting right and gave her a peanut butter sandwich which brought CBG up.   Reviewed and discussed medications with patient/daughter o Was taking 20 units of 70/30 insulin BID with meals. Has decreased to 10 units BID with meals because of nighttime hypoglycemia  Discussed diet and meal planning o Daughter is staying with her 3 weeks a month and is preparing 3 meals a day o Eating cereal, fruit, and Glucerna shake for breakfast  Discussed that blood sugar readings having been much more stable since eating more regularly and decreasing insulin to 10 units BID  Reviewed and discussed future appt with Dr Livia Snellen in 01/2020 and with Lottie Dawson, PharmD tomorrow, 10/06/20  Recommended to reach out  to PCP with any new or worsening problems and with any blood sugar readings outside of recommended range  Provided with RNCM contact number and encouraged to reach out as needed  Patient Self Care Activities:   Performs ADL's independently  Performs IADL's independently  Please see past updates related to this goal by clicking on the "Past Updates" button in the selected goal        "I want to make sure my blood pressure is under control" (pt-stated)        CARE PLAN ENTRY (see longitudinal plan of care for additional care plan information)  Current Barriers:   Chronic Disease Management support and education needs related to hypertension in a patient with kidney disease and diabetes  Nurse Case Manager Clinical Goal(s):   Over the next 30 days, patient will talk with RN Care Manager regarding home blood pressure readings  Over the next 30 days, patient will check and record blood pressure daily and call PCP with any readings outside of recommended range  Interventions:   Inter-disciplinary care team collaboration (see longitudinal plan of care)  Chart reviewed including relevant office notes and lab results o Elevated kidney functions over last several blood draws  Reviewed and discussed medications o Explained why she is taking Imdur o Amlodipine for BP control o Benazepril and lasix was held due to elev kidney functions. Patient is still not taking those  Discussed that daughter is organizing medication in a pill box  Encouraged patient/daughter to check and record blood pressure daily and to vary the times  Discussed home readings o Yesterday 160/70 (after activity) o 10/03/20 131/62 during rest  Encouraged patient/daughter to reach out to PCP with any readings outside of recommended range  Reviewed upcoming appointments o Patient to talk with Lottie Dawson, PharmD regarding blood sugar management  Patient Self Care Activities:   Performs ADL's  independently  Performs IADL's independently  Please see past updates related to this goal by clicking on the "Past Updates" button in the selected goal         The patient verbalized understanding of instructions, educational materials, and care plan provided today and declined offer to receive copy of patient instructions, educational materials, and care plan.   Follow-up Plan The patient has been provided with contact information for the care management team and has been advised to call with any health related questions or concerns.  The care management team will reach out to the patient again over the next 30 days.  Telephone visit with Lottie Dawson, PharmD on 10/06/20   Chong Sicilian, BSN, RN-BC [place] [facility] [place] Management Direct Dial: [phone]

## 2020-10-06 ENCOUNTER — Ambulatory Visit: Payer: Medicare Other | Admitting: Pharmacist

## 2020-10-06 DIAGNOSIS — E119 Type 2 diabetes mellitus without complications: Secondary | ICD-10-CM

## 2020-10-06 MED ORDER — LANTUS SOLOSTAR 100 UNIT/ML ~~LOC~~ SOPN: 12.0000 [IU] | PEN_INJECTOR | Freq: Every day | SUBCUTANEOUS | 11 refills | Status: DC

## 2020-10-06 NOTE — Progress Notes (Addendum)
Chronic Care Management   Follow Up Note DIABETES   10/06/2020 Name: Brianna Weber MRN: [id number] DOB: 03/23/1946  Referred by: [name], Warren, MD Reason for referral : Chronic care management-DIABETES    Brianna Weber is a 74 y.o. year old female who is a primary care patient of [name], Warren, MD. The CCM team was consulted for assistance with chronic disease management and care coordination needs.    Review of patient status, including review of consultants reports, relevant laboratory and other test results, and collaboration with appropriate care team members and the patient's provider was performed as part of comprehensive patient evaluation and provision of chronic care management services.    SDOH (Social Determinants of Health) assessments performed: No See Care Plan activities for detailed interventions related to SDOH)     Outpatient Encounter Medications as of 10/06/2020  Medication Sig Note  . amLODipine (NORVASC) 10 MG tablet Take 1 tablet (10 mg total) by mouth daily.   . aspirin 81 MG tablet Take 81 mg by mouth daily.     . benazepril (LOTENSIN) 20 MG tablet Take 1 tablet (20 mg total) by mouth daily. (Patient not taking: Reported on 09/29/2020) 09/29/2020: 09/29/2020 being held due to decreased kidney function  . diphenhydrAMINE (ALLERGY RELIEF) 25 MG tablet Take 25 mg by mouth daily.    . furosemide (LASIX) 40 MG tablet Take 1 tablet (40 mg total) by mouth daily. (Patient not taking: Reported on 09/29/2020) 09/29/2020: 09/29/2020 Being held due to decreased kidney function  . glucose blood (ONETOUCH ULTRA) test strip CHECK BLOOD SUGAR 2 TIMES A DAY Dx E11.9   . insulin glargine (LANTUS SOLOSTAR) 100 UNIT/ML Solostar Pen Inject 12 Units into the skin at bedtime. Discontinue 70/30 insulin.   . isosorbide mononitrate (IMDUR) 60 MG 24 hr tablet Take 1 tablet (60 mg total) by mouth daily.   . levothyroxine (SYNTHROID) 150 MCG tablet Take 1 tablet (150 mcg  total) by mouth daily before breakfast.   . simvastatin (ZOCOR) 10 MG tablet Take 1 tablet (10 mg total) by mouth daily.   . vitamin B-12 (CYANOCOBALAMIN) 500 MCG tablet Take 500 mcg by mouth daily.   . vitamin E (VITAMIN E) 200 UNIT capsule Take 200 Units by mouth daily.   . [DISCONTINUED] insulin NPH-regular Human (NOVOLIN 70/30) (70-30) 100 UNIT/ML injection Inject 20 Units into the skin 2 (two) times daily with a meal. With breakfast and supper (Patient taking differently: Inject 10 Units into the skin 2 (two) times daily with a meal. With breakfast and supper)    No facility-administered encounter medications on file as of 10/06/2020.     Objective:   Goals Addressed              This Visit's Progress     Patient Stated   .  I would like to avoid hypoglycemia (pt-stated)        CARE PLAN ENTRY (see longitudinal plan of care for additional care plan information)  Current Barriers:  . Social, financial, community barriers:  . Diabetes: T2DM; complicated by chronic medical conditions including HTN,HLD most recent A1c 5.8% . Most recent eGFR: 37 . Current antihyperglycemic regimen: insulin 70/30 decreased to 10 units twice daily . Reports hypoglycemic symptoms, including dizziness, lightheadedness, shaking, sweating--mostly at night, will transition patient to basal insulin at bedtime only. She has had rebound hyperglycemia when taken off of insulin in the past.  Not a great candidate for GLP1 . Denies hyperglycemic symptoms . Current meal patterns:   o Breakfast: n/a o Lunch:chili/crackers,porkchop sandwich, pb & j sandwiches o Supper: taco salads, bbq chicken--daughter is helping to prep better meals for patient  o Snacks:n/a o Drinks:n/a . Current exercise: n/a . Current blood glucose readings: FBG 130-160s, droppping low in the 60-70s if she is not eating steady meals (mostly at night time) . Cardiovascular risk reduction: o Current hypertensive regimen: ACEi on hold d/t  kidney, amlodipine o Current hyperlipidemia regimen: simvastatin o Current antiplatelet regimen: n/a  Pharmacist Clinical Goal(s):  . Over the next 90 days, patient will work with PharmD and primary care provider to address needs related to DM management; avoiding hypoglycemia  Interventions: . Comprehensive medication review performed, medication list updated in electronic medical record . Inter-disciplinary care team collaboration (see longitudinal plan of care) . Transitioning patient to basal insulin from 70/30--having hypoglycemia overnight or during the day when not eating  Patient Self Care Activities:  . Patient will check blood glucose 2 times daily , document, and provide at future appointments . Patient will focus on medication adherence by continuing to take medication as prescribed . Patient will take medications as prescribed . Patient will contact provider with any episodes of hypoglycemia . Patient will report any questions or concerns to provider   Initial goal documentation         Plan:   Telephone follow up appointment with CCM PharmD scheduled for:  10/13/2020   SIGNATURE   [place] , PharmD, BCPS Clinical Pharmacist, [facility] [place]  [facility] Phone [phone]    

## 2020-10-06 NOTE — Patient Instructions (Signed)
Visit Information  Goals Addressed              This Visit's Progress     Patient Stated     I would like to avoid hypoglycemia (pt-stated)        CARE PLAN ENTRY (see longitudinal plan of care for additional care plan information)  Current Barriers:   Social, financial, community barriers:   Diabetes: F1QR; complicated by chronic medical conditions including HTN,HLD most recent A1c 5.8%  Most recent eGFR: 37  Current antihyperglycemic regimen: insulin 70/30 decreased to 10 units twice daily  Reports hypoglycemic symptoms, including dizziness, lightheadedness, shaking, sweating--mostly at night, will transition patient to basal insulin at bedtime only. She has had rebound hyperglycemia when taken off of insulin in the past.  Not a great candidate for GLP1  Denies hyperglycemic symptoms  Current meal patterns: o Breakfast: n/a o Lunch:chili/crackers,porkchop sandwich, pb & j sandwiches o Supper: taco salads, bbq chicken--daughter is helping to prep better meals for patient  o Snacks:n/a o Drinks:n/a  Current exercise: n/a  Current blood glucose readings: FBG 130-160s, droppping low in the 60-70s if she is not eating steady meals (mostly at night time)  Cardiovascular risk reduction: o Current hypertensive regimen: ACEi on hold d/t kidney, amlodipine o Current hyperlipidemia regimen: simvastatin o Current antiplatelet regimen: n/a  Pharmacist Clinical Goal(s):   Over the next 90 days, patient will work with PharmD and primary care provider to address needs related to DM management; avoiding hypoglycemia  Interventions:  Comprehensive medication review performed, medication list updated in electronic medical record  Inter-disciplinary care team collaboration (see longitudinal plan of care)  Transitioning patient to basal insulin from 70/30--having hypoglycemia overnight or during the day when not eating  Patient Self Care Activities:   Patient will check blood  glucose 2 times daily , document, and provide at future appointments  Patient will focus on medication adherence by continuing to take medication as prescribed  Patient will take medications as prescribed  Patient will contact provider with any episodes of hypoglycemia  Patient will report any questions or concerns to provider   Initial goal documentation        The patient verbalized understanding of instructions, educational materials, and care plan provided today and agreed to receive a mailed copy of patient instructions, educational materials, and care plan.   Telephone follow up appointment with care management team member scheduled for: 10/13/2020  SIGNATURE Regina Eck, PharmD, BCPS Clinical Pharmacist, [place]  [facility] Phone [phone]

## 2020-10-11 ENCOUNTER — Telehealth: Payer: Self-pay

## 2020-10-11 ENCOUNTER — Telehealth: Payer: Medicare Other | Admitting: *Deleted

## 2020-10-11 MED ORDER — PEN NEEDLES 31G X 5 MM MISC: 3 refills | Status: DC

## 2020-10-11 NOTE — Telephone Encounter (Signed)
Pen needles sent to pharmacy.

## 2020-11-02 ENCOUNTER — Telehealth: Payer: Self-pay

## 2020-11-02 NOTE — Telephone Encounter (Signed)
Scheduled an apt for thyroid check 11/24/2020 with [name]

## 2020-11-02 NOTE — Telephone Encounter (Signed)
Lmtcb  Patient was last seen 07/27/20 NTBS Can not just come in for lab work- needs to schedule appointment with PCP Please schedule when patient returns call

## 2020-11-24 ENCOUNTER — Ambulatory Visit (INDEPENDENT_AMBULATORY_CARE_PROVIDER_SITE_OTHER): Payer: Medicare Other | Admitting: Family Medicine

## 2020-11-24 ENCOUNTER — Encounter: Payer: Self-pay | Admitting: Family Medicine

## 2020-11-24 ENCOUNTER — Other Ambulatory Visit: Payer: Self-pay

## 2020-11-24 VITALS — BP 139/68 | HR 71 | Temp 98.3°F | Resp 20 | Ht 62.5 in | Wt 131.2 lb

## 2020-11-24 DIAGNOSIS — Z794 Long term (current) use of insulin: Secondary | ICD-10-CM

## 2020-11-24 DIAGNOSIS — I10 Essential (primary) hypertension: Secondary | ICD-10-CM

## 2020-11-24 DIAGNOSIS — E785 Hyperlipidemia, unspecified: Secondary | ICD-10-CM

## 2020-11-24 DIAGNOSIS — E039 Hypothyroidism, unspecified: Secondary | ICD-10-CM | POA: Diagnosis not present

## 2020-11-24 DIAGNOSIS — E119 Type 2 diabetes mellitus without complications: Secondary | ICD-10-CM

## 2020-11-24 LAB — BAYER DCA HB A1C WAIVED: HB A1C (BAYER DCA - WAIVED): 6.2 % (ref <7.0)

## 2020-11-24 MED ORDER — LEVOTHYROXINE SODIUM 150 MCG PO TABS
150.0000 ug | ORAL_TABLET | Freq: Every day | ORAL | 1 refills | Status: DC
Start: 2020-11-24 — End: 2021-09-21

## 2020-11-24 MED ORDER — AMLODIPINE BESYLATE 10 MG PO TABS
10.0000 mg | ORAL_TABLET | Freq: Every day | ORAL | 1 refills | Status: DC
Start: 2020-11-24 — End: 2021-05-26

## 2020-11-24 MED ORDER — SIMVASTATIN 10 MG PO TABS
10.0000 mg | ORAL_TABLET | Freq: Every day | ORAL | 1 refills | Status: DC
Start: 2020-11-24 — End: 2021-03-02

## 2020-11-24 MED ORDER — BENAZEPRIL HCL 20 MG PO TABS
20.0000 mg | ORAL_TABLET | Freq: Every day | ORAL | 1 refills | Status: DC
Start: 2020-11-24 — End: 2020-11-29

## 2020-11-24 MED ORDER — ISOSORBIDE MONONITRATE ER 60 MG PO TB24
60.0000 mg | ORAL_TABLET | Freq: Every day | ORAL | 1 refills | Status: DC
Start: 2020-11-24 — End: 2021-09-21

## 2020-11-24 NOTE — Progress Notes (Addendum)
Subjective:  Patient ID: Brianna Weber, female    DOB: 1946/04/05  Age: 75 y.o. MRN: [id number]  CC: Medical Management of Chronic Issues   HPI Brianna Weber presents for patient presents for follow-up on  thyroid. The patient has a history of hypothyroidism for many years. It has been stable recently. Pt. denies any change in  voice, loss of hair, heat or cold intolerance. Energy level has been adequate to good. Patient denies constipation and diarrhea. No myxedema. Medication is as noted below. Verified that pt is taking it daily on an empty stomach. Well tolerated.  Follow-up of diabetes. Patient does not check blood sugar at home Patient denies symptoms such as polyuria, polydipsia, excessive hunger, nausea No significant hypoglycemic spells noted. Medications as noted below. Taking them regularly without complication/adverse reaction being reported today.   Follow-up of hypertension. Patient has no history of headache chest pain or shortness of breath or recent cough. Patient also denies symptoms of TIA such as numbness weakness lateralizing. Patient checks  blood pressure at home and has not had any elevated readings recently. Patient denies side effects from his medication. States taking it regularly.  Patient in for follow-up of elevated cholesterol. Doing well without complaints on current medication. Denies side effects of statin including myalgia and arthralgia and nausea. Also in today for liver function testing. Currently no chest pain, shortness of breath or other cardiovascular related symptoms noted.   Depression screen [facility] 2/9 11/24/2020 07/27/2020 09/02/2019  Decreased Interest 0 0 0  Down, Depressed, Hopeless 0 0 0  PHQ - 2 Score 0 0 0  Altered sleeping - - -  Tired, decreased energy - - -  Change in appetite - - -  Feeling bad or failure about yourself  - - -  Trouble concentrating - - -  Moving slowly or fidgety/restless - - -  Suicidal thoughts - - -   PHQ-9 Score - - -  Difficult doing work/chores - - -  Some recent data might be hidden    History Brianna Weber has a past medical history of Allergy, Arrhythmia, Breast cancer ([place]), Diabetes mellitus, Diverticulosis of colon, Hyperlipidemia, Hypertension, NSTEMI (non-ST elevated myocardial infarction) ([place]), Peripheral edema, Thyroid disease, and Vitamin D deficiency.   She has a past surgical history that includes Mastectomy; Cholecystectomy; and Abdominal hysterectomy.   Her family history includes Cancer in her maternal aunt; Diabetes in her daughter; Heart attack in her father; Heart disease in her brother, brother, maternal aunt, maternal grandmother, maternal uncle, mother, sister, sister, and sister; Hyperlipidemia in her maternal aunt and maternal uncle; Hypertension in her maternal aunt, maternal uncle, mother, and another family member.She reports that she has never smoked. She has never used smokeless tobacco. She reports that she does not drink alcohol and does not use drugs.    ROS Review of Systems  Constitutional: Negative.   HENT: Negative.   Eyes: Negative for visual disturbance.  Respiratory: Negative for shortness of breath.   Cardiovascular: Negative for chest pain.  Gastrointestinal: Negative for abdominal pain.  Musculoskeletal: Negative for arthralgias.    Objective:  BP 139/68   Pulse 71   Temp 98.3 F (36.8 C) (Temporal)   Resp 20   Ht 5' 2.5" (1.588 m)   Wt 131 lb 4 oz (59.5 kg)   SpO2 97%   BMI 23.62 kg/m   BP Readings from Last 3 Encounters:  11/24/20 139/68  07/27/20 (!) 119/56  01/27/19 111/67    Wt Readings from  Last 3 Encounters:  11/24/20 131 lb 4 oz (59.5 kg)  09/03/20 123 lb 14.4 oz (56.2 kg)  07/27/20 124 lb (56.2 kg)     Physical Exam Constitutional:      General: She is not in acute distress.    Appearance: She is well-developed and well-nourished.  Cardiovascular:     Rate and Rhythm: Normal rate and regular rhythm.   Pulmonary:     Breath sounds: Normal breath sounds.  Skin:    General: Skin is warm and dry.  Neurological:     Mental Status: She is alert and oriented to person, place, and time.  Psychiatric:        Mood and Affect: Mood and affect normal.       Assessment & Plan:   Brianna Weber was seen today for medical management of chronic issues.  Diagnoses and all orders for this visit:  Type 2 diabetes mellitus without complication, with long-term current use of insulin (HCC) -     Bayer DCA Hb A1c Waived -     CBC with Differential/Platelet -     CMP14+EGFR -     Lipid panel  Essential hypertension -     CBC with Differential/Platelet -     CMP14+EGFR -     Lipid panel  Hypothyroidism, unspecified type -     CBC with Differential/Platelet -     CMP14+EGFR -     Lipid panel -     Thyroid Panel With TSH  Hyperlipidemia with target LDL less than 100 -     CBC with Differential/Platelet -     CMP14+EGFR -     Lipid panel  Other orders -     simvastatin (ZOCOR) 10 MG tablet; Take 1 tablet (10 mg total) by mouth daily. -     levothyroxine (SYNTHROID) 150 MCG tablet; Take 1 tablet (150 mcg total) by mouth daily before breakfast. -     isosorbide mononitrate (IMDUR) 60 MG 24 hr tablet; Take 1 tablet (60 mg total) by mouth daily. -     Discontinue: benazepril (LOTENSIN) 20 MG tablet; Take 1 tablet (20 mg total) by mouth daily. -     amLODipine (NORVASC) 10 MG tablet; Take 1 tablet (10 mg total) by mouth daily. -     furosemide (LASIX) 40 MG tablet; Take 1 tablet (40 mg total) by mouth daily.       I have discontinued [name]. Brianna Weber's benazepril and benazepril. I am also having her maintain her aspirin, vitamin B-12, vitamin E, diphenhydrAMINE, OneTouch Ultra, Lantus SoloStar, Pen Needles, cholecalciferol, simvastatin, levothyroxine, isosorbide mononitrate, amLODipine, and furosemide.  Allergies as of 11/24/2020   No Known Allergies     Medication List        Accurate as of November 24, 2020 11:59 PM. If you have any questions, ask your nurse or doctor.        STOP taking these medications   benazepril 20 MG tablet Commonly known as: LOTENSIN Stopped by: [name], MD     TAKE these medications   amLODipine 10 MG tablet Commonly known as: NORVASC Take 1 tablet (10 mg total) by mouth daily.   aspirin 81 MG tablet Take 81 mg by mouth daily.   cholecalciferol 25 MCG (1000 UNIT) tablet Commonly known as: VITAMIN D3 Take 1,000 Units by mouth daily.   diphenhydrAMINE 25 MG tablet Commonly known as: BENADRYL Take 25 mg by mouth daily.   furosemide 40 MG tablet Commonly known as: LASIX  Take 1 tablet (40 mg total) by mouth daily.   isosorbide mononitrate 60 MG 24 hr tablet Commonly known as: IMDUR Take 1 tablet (60 mg total) by mouth daily.   Lantus SoloStar 100 UNIT/ML Solostar Pen Generic drug: insulin glargine Inject 12 Units into the skin at bedtime. Discontinue 70/30 insulin.   levothyroxine 150 MCG tablet Commonly known as: SYNTHROID Take 1 tablet (150 mcg total) by mouth daily before breakfast.   OneTouch Ultra test strip Generic drug: glucose blood CHECK BLOOD SUGAR 2 TIMES A DAY Dx E11.9   Pen Needles 31G X 5 MM Misc Use to give insulin daily Dx E11.9   simvastatin 10 MG tablet Commonly known as: ZOCOR Take 1 tablet (10 mg total) by mouth daily.   vitamin B-12 500 MCG tablet Commonly known as: CYANOCOBALAMIN Take 500 mcg by mouth daily.   vitamin E 200 UNIT capsule Take 200 Units by mouth daily.        Follow-up: Return in about 3 months (around 02/22/2021).  [name], M.D.

## 2020-11-25 LAB — CMP14+EGFR
ALT: 14 IU/L (ref 0–32)
AST: 18 IU/L (ref 0–40)
Albumin/Globulin Ratio: 1.6 (ref 1.2–2.2)
Albumin: 4.1 g/dL (ref 3.7–4.7)
Alkaline Phosphatase: 137 IU/L — ABNORMAL HIGH (ref 44–121)
BUN/Creatinine Ratio: 22 (ref 12–28)
BUN: 27 mg/dL (ref 8–27)
Bilirubin Total: 0.3 mg/dL (ref 0.0–1.2)
CO2: 23 mmol/L (ref 20–29)
Calcium: 9.5 mg/dL (ref 8.7–10.3)
Chloride: 104 mmol/L (ref 96–106)
Creatinine, Ser: 1.24 mg/dL — ABNORMAL HIGH (ref 0.57–1.00)
GFR calc Af Amer: 50 mL/min/{1.73_m2} — ABNORMAL LOW (ref >59)
GFR calc non Af Amer: 43 mL/min/{1.73_m2} — ABNORMAL LOW (ref >59)
Globulin, Total: 2.5 g/dL (ref 1.5–4.5)
Glucose: 169 mg/dL — ABNORMAL HIGH (ref 65–99)
Potassium: 4.3 mmol/L (ref 3.5–5.2)
Sodium: 140 mmol/L (ref 134–144)
Total Protein: 6.6 g/dL (ref 6.0–8.5)

## 2020-11-25 LAB — CBC WITH DIFFERENTIAL/PLATELET
Basophils Absolute: 0.1 10*3/uL (ref 0.0–0.2)
Basos: 1 %
EOS (ABSOLUTE): 0.4 10*3/uL (ref 0.0–0.4)
Eos: 5 %
Hematocrit: 35.6 % (ref 34.0–46.6)
Hemoglobin: 11.7 g/dL (ref 11.1–15.9)
Immature Grans (Abs): 0 10*3/uL (ref 0.0–0.1)
Immature Granulocytes: 0 %
Lymphocytes Absolute: 1.5 10*3/uL (ref 0.7–3.1)
Lymphs: 21 %
MCH: 27.5 pg (ref 26.6–33.0)
MCHC: 32.9 g/dL (ref 31.5–35.7)
MCV: 84 fL (ref 79–97)
Monocytes Absolute: 0.6 10*3/uL (ref 0.1–0.9)
Monocytes: 8 %
Neutrophils Absolute: 4.5 10*3/uL (ref 1.4–7.0)
Neutrophils: 65 %
Platelets: 326 10*3/uL (ref 150–450)
RBC: 4.26 x10E6/uL (ref 3.77–5.28)
RDW: 12.5 % (ref 11.7–15.4)
WBC: 7 10*3/uL (ref 3.4–10.8)

## 2020-11-25 LAB — LIPID PANEL
Chol/HDL Ratio: 2.3 ratio (ref 0.0–4.4)
Cholesterol, Total: 146 mg/dL (ref 100–199)
HDL: 63 mg/dL (ref >39)
LDL Chol Calc (NIH): 56 mg/dL (ref 0–99)
Triglycerides: 160 mg/dL — ABNORMAL HIGH (ref 0–149)
VLDL Cholesterol Cal: 27 mg/dL (ref 5–40)

## 2020-11-25 LAB — THYROID PANEL WITH TSH
Free Thyroxine Index: 3.7 (ref 1.2–4.9)
T3 Uptake Ratio: 33 % (ref 24–39)
T4, Total: 11.1 ug/dL (ref 4.5–12.0)
TSH: 0.012 u[IU]/mL — ABNORMAL LOW (ref 0.450–4.500)

## 2020-11-26 ENCOUNTER — Telehealth: Payer: Self-pay | Admitting: Family Medicine

## 2020-11-26 NOTE — Telephone Encounter (Signed)
Daughter is aware Dr Livia Snellen will review her labs and then we will call back once they have been reviewed. Is pt supposed to be on Furosemide and Benazepril?

## 2020-11-26 NOTE — Telephone Encounter (Signed)
Pt was told to stop taking benazepril and furosemide but daughter was calling to let us know that they were given a refill when they picked up her other medications. They also would like to discuss thyroid levels with the nurse

## 2020-11-29 ENCOUNTER — Other Ambulatory Visit: Payer: Self-pay | Admitting: Family Medicine

## 2020-11-29 MED ORDER — FUROSEMIDE 40 MG PO TABS
40.0000 mg | ORAL_TABLET | Freq: Every day | ORAL | 1 refills | Status: DC
Start: 2020-11-29 — End: 2020-11-29

## 2020-11-29 NOTE — Telephone Encounter (Signed)
Please let the patient know that the labs look better. I recommend holding off on the meds for now. If kidney function stays this good, it may be possible to resume them.

## 2020-11-29 NOTE — Addendum Note (Signed)
Addended by: [name] on: 11/29/2020 02:48 PM   Modules accepted: Orders

## 2020-11-29 NOTE — Telephone Encounter (Signed)
Left detailed message per DPR  

## 2020-11-30 NOTE — Progress Notes (Signed)
Hello Fayne,  Your lab result is normal and/or stable.Some minor variations that are not significant are commonly marked abnormal, but do not represent any medical problem for you.  Best regards, [name], M.D.

## 2020-12-01 ENCOUNTER — Other Ambulatory Visit: Payer: Self-pay | Admitting: Family Medicine

## 2020-12-01 ENCOUNTER — Telehealth: Payer: Self-pay

## 2020-12-01 MED ORDER — BLOOD GLUCOSE MONITOR KIT: PACK | 0 refills | Status: DC

## 2020-12-01 MED ORDER — ONETOUCH ULTRA VI STRP
ORAL_STRIP | 3 refills | Status: DC
Start: 2020-12-01 — End: 2021-10-03

## 2020-12-01 MED ORDER — ONETOUCH ULTRA VI STRP
ORAL_STRIP | 3 refills | Status: DC
Start: 2020-12-01 — End: 2020-12-01

## 2020-12-01 NOTE — Telephone Encounter (Signed)
Please let the patient know that I sent their prescription to their pharmacy. Thanks, WS 

## 2020-12-02 NOTE — Telephone Encounter (Signed)
Daughter notified 

## 2021-01-25 ENCOUNTER — Ambulatory Visit: Payer: Medicare Other | Admitting: Family Medicine

## 2021-02-22 ENCOUNTER — Ambulatory Visit: Payer: Medicare Other | Admitting: Family Medicine

## 2021-03-02 ENCOUNTER — Other Ambulatory Visit: Payer: Self-pay | Admitting: Family Medicine

## 2021-03-03 ENCOUNTER — Encounter: Payer: Self-pay | Admitting: Family Medicine

## 2021-03-07 ENCOUNTER — Other Ambulatory Visit: Payer: Self-pay

## 2021-03-07 ENCOUNTER — Other Ambulatory Visit: Payer: Medicare Other

## 2021-03-15 ENCOUNTER — Ambulatory Visit: Payer: Medicare Other | Admitting: Family Medicine

## 2021-03-24 ENCOUNTER — Ambulatory Visit (INDEPENDENT_AMBULATORY_CARE_PROVIDER_SITE_OTHER): Payer: Medicare Other | Admitting: Family Medicine

## 2021-03-24 ENCOUNTER — Encounter: Payer: Self-pay | Admitting: Family Medicine

## 2021-03-24 DIAGNOSIS — E039 Hypothyroidism, unspecified: Secondary | ICD-10-CM

## 2021-03-24 DIAGNOSIS — E782 Mixed hyperlipidemia: Secondary | ICD-10-CM

## 2021-03-24 DIAGNOSIS — Z794 Long term (current) use of insulin: Secondary | ICD-10-CM

## 2021-03-24 DIAGNOSIS — I10 Essential (primary) hypertension: Secondary | ICD-10-CM

## 2021-03-24 DIAGNOSIS — E119 Type 2 diabetes mellitus without complications: Secondary | ICD-10-CM | POA: Diagnosis not present

## 2021-03-24 NOTE — Progress Notes (Signed)
Subjective:  Patient ID: Brianna Weber, female    DOB: 29-Sep-1946  Age: 75 y.o. MRN: [id number]  CC: No chief complaint on file.   HPI MARISAH LAKER presents forFollow-up of diabetes. Patient checks blood sugar at home.   116 fasting and 160s postprandial Patient denies symptoms such as polyuria, polydipsia, excessive hunger, nausea No significant hypoglycemic spells noted. Medications reviewed. Pt reports taking them regularly without complication/adverse reaction being reported today.    History Charda has a past medical history of Allergy, Arrhythmia, Breast cancer (HCC), Diabetes mellitus, Diverticulosis of colon, Hyperlipidemia, Hypertension, NSTEMI (non-ST elevated myocardial infarction) (HCC), Peripheral edema, Thyroid disease, and Vitamin D deficiency.   She has a past surgical history that includes Mastectomy; Cholecystectomy; and Abdominal hysterectomy.   Her family history includes Cancer in her maternal aunt; Diabetes in her daughter; Heart attack in her father; Heart disease in her brother, brother, maternal aunt, maternal grandmother, maternal uncle, mother, sister, sister, and sister; Hyperlipidemia in her maternal aunt and maternal uncle; Hypertension in her maternal aunt, maternal uncle, mother, and another family member.She reports that she has never smoked. She has never used smokeless tobacco. She reports that she does not drink alcohol and does not use drugs.  Current Outpatient Medications on File Prior to Visit  Medication Sig Dispense Refill  . amLODipine (NORVASC) 10 MG tablet Take 1 tablet (10 mg total) by mouth daily. 90 tablet 1  . aspirin 81 MG tablet Take 81 mg by mouth daily.    . blood glucose meter kit and supplies KIT Dispense based on patient and insurance preference. Use up to four times daily as directed. One touch ultra preferred. (FOR ICD-10 : E11.9 1 each 0  . cholecalciferol (VITAMIN D3) 25 MCG (1000 UNIT) tablet Take 1,000 Units by  mouth daily.    . diphenhydrAMINE (BENADRYL) 25 MG tablet Take 25 mg by mouth daily.     Marland Kitchen glucose blood (ONETOUCH ULTRA) test strip CHECK BLOOD SUGAR 2 TIMES A DAY Dx E11.9 200 strip 3  . insulin glargine (LANTUS SOLOSTAR) 100 UNIT/ML Solostar Pen Inject 12 Units into the skin at bedtime. Discontinue 70/30 insulin. 15 mL 11  . Insulin Pen Needle (PEN NEEDLES) 31G X 5 MM MISC Use to give insulin daily Dx E11.9 100 each 3  . isosorbide mononitrate (IMDUR) 60 MG 24 hr tablet Take 1 tablet (60 mg total) by mouth daily. 90 tablet 1  . levothyroxine (SYNTHROID) 150 MCG tablet Take 1 tablet (150 mcg total) by mouth daily before breakfast. 90 tablet 1  . simvastatin (ZOCOR) 10 MG tablet TAKE 1 TABLET DAILY 90 tablet 0  . vitamin B-12 (CYANOCOBALAMIN) 500 MCG tablet Take 500 mcg by mouth daily.    . vitamin E 200 UNIT capsule Take 200 Units by mouth daily.     No current facility-administered medications on file prior to visit.    ROS Review of Systems  Constitutional: Negative.   HENT: Negative.   Eyes: Negative for visual disturbance.  Respiratory: Negative for shortness of breath.   Cardiovascular: Negative for chest pain.  Gastrointestinal: Negative for abdominal pain.  Musculoskeletal: Negative for arthralgias.    Objective:  There were no vitals taken for this visit.  BP Readings from Last 3 Encounters:  11/24/20 139/68  07/27/20 (!) 119/56  01/27/19 111/67    Exam deferred. Pt. Harboring due to COVID 19. Phone visit performed.    Assessment & Plan:   Diagnoses and all orders for this visit:  Essential hypertension -     CBC with Differential/Platelet -     CMP14+EGFR  Hypothyroidism, unspecified type -     CBC with Differential/Platelet -     CMP14+EGFR -     TSH + free T4  Type 2 diabetes mellitus without complication, with long-term current use of insulin (HCC) -     Bayer DCA Hb A1c Waived -     CBC with Differential/Platelet -     CMP14+EGFR  Mixed  hyperlipidemia -     Lipid panel      I am having [name]. [name] maintain her aspirin, vitamin B-12, vitamin E, diphenhydrAMINE, Lantus SoloStar, Pen Needles, cholecalciferol, levothyroxine, isosorbide mononitrate, amLODipine, OneTouch Ultra, blood glucose meter kit and supplies, and simvastatin.  Virtual Visit via telephone Note  I discussed the limitations, risks, security and privacy concerns of performing an evaluation and management service by telephone and the availability of in person appointments. I also discussed with the patient that there may be a patient responsible charge related to this service. The patient expressed understanding and agreed to proceed. Pt. Is at home. Dr. Livia Snellen is in his office.  Follow Up Instructions:   I discussed the assessment and treatment plan with the patient. The patient was provided an opportunity to ask questions and all were answered. The patient agreed with the plan and demonstrated an understanding of the instructions.   The patient was advised to call back or seek an in-person evaluation if the symptoms worsen or if the condition fails to improve as anticipated.  Total minutes including chart review and phone contact time: 26    Follow-up: Return in about 4 months (around 07/25/2021) for Hypothyroidism, hypertension, diabetes, cholesterol.  [name], M.D.

## 2021-04-08 ENCOUNTER — Telehealth: Payer: Self-pay | Admitting: Family Medicine

## 2021-04-08 NOTE — Telephone Encounter (Signed)
Just an FYI for provider

## 2021-04-08 NOTE — Telephone Encounter (Signed)
Pt wants to report vitals for today bp 104/54 Heart rate 58 Weight 122 BMI 22.31 She did not get to come to her ov because she did not have a ride

## 2021-04-22 DIAGNOSIS — R6889 Other general symptoms and signs: Secondary | ICD-10-CM | POA: Diagnosis not present

## 2021-04-22 DIAGNOSIS — R531 Weakness: Secondary | ICD-10-CM | POA: Diagnosis not present

## 2021-04-22 DIAGNOSIS — R55 Syncope and collapse: Secondary | ICD-10-CM | POA: Diagnosis not present

## 2021-04-22 DIAGNOSIS — R9431 Abnormal electrocardiogram [ECG] [EKG]: Secondary | ICD-10-CM | POA: Diagnosis not present

## 2021-04-22 DIAGNOSIS — R42 Dizziness and giddiness: Secondary | ICD-10-CM | POA: Diagnosis not present

## 2021-04-22 DIAGNOSIS — R404 Transient alteration of awareness: Secondary | ICD-10-CM | POA: Diagnosis not present

## 2021-04-22 DIAGNOSIS — Z743 Need for continuous supervision: Secondary | ICD-10-CM | POA: Diagnosis not present

## 2021-05-26 ENCOUNTER — Encounter: Payer: Self-pay | Admitting: Family Medicine

## 2021-05-26 ENCOUNTER — Ambulatory Visit (INDEPENDENT_AMBULATORY_CARE_PROVIDER_SITE_OTHER): Payer: Medicare Other | Admitting: Family Medicine

## 2021-05-26 DIAGNOSIS — E119 Type 2 diabetes mellitus without complications: Secondary | ICD-10-CM | POA: Diagnosis not present

## 2021-05-26 DIAGNOSIS — Z794 Long term (current) use of insulin: Secondary | ICD-10-CM | POA: Diagnosis not present

## 2021-05-26 DIAGNOSIS — R55 Syncope and collapse: Secondary | ICD-10-CM

## 2021-05-26 DIAGNOSIS — I10 Essential (primary) hypertension: Secondary | ICD-10-CM

## 2021-05-26 NOTE — Progress Notes (Signed)
Subjective:    Patient ID: Brianna Weber, female    DOB: 16-Jan-1946, 75 y.o.   MRN: [id number]   HPI: Brianna Weber is a 75 y.o. female presenting for  blood pressure dropping. When she gets woozy, and checks BP it is low. Had a syncope episode a few days ago. No prodrome. Was just moving around in the kitchen. Was out momentarily. Has happened. Sugar doing well. Home nurse says she needs to stop the amlodipine because the readings she gets are low. Pt. Checks BP at home too. Doesn't recall numbers, but they are low.  Glucose running 108-139 fasting. Not low at times that she passed out or when she feels bad.    Depression screen [facility] 2/9 11/24/2020 07/27/2020 09/02/2019 01/27/2019 09/16/2018  Decreased Interest 0 0 0 1 2  Down, Depressed, Hopeless 0 0 0 1 0  PHQ - 2 Score 0 0 0 2 2  Altered sleeping - - - 0 0  Tired, decreased energy - - - 0 0  Change in appetite - - - 1 0  Feeling bad or failure about yourself  - - - 0 0  Trouble concentrating - - - 0 0  Moving slowly or fidgety/restless - - - 0 0  Suicidal thoughts - - - 0 0  PHQ-9 Score - - - 3 2  Difficult doing work/chores - - - - Somewhat difficult  Some recent data might be hidden     Relevant past medical, surgical, family and social history reviewed and updated as indicated.  Interim medical history since our last visit reviewed. Allergies and medications reviewed and updated.  ROS:  Review of Systems  Constitutional: Negative.   HENT: Negative.    Eyes:  Negative for visual disturbance.  Respiratory:  Negative for shortness of breath.   Cardiovascular:  Negative for chest pain.  Gastrointestinal:  Negative for abdominal pain.  Musculoskeletal:  Negative for arthralgias.  Neurological:  Positive for syncope.    Social History   Tobacco Use  Smoking Status Never  Smokeless Tobacco Never       Objective:     Wt Readings from Last 3 Encounters:  11/24/20 131 lb 4 oz (59.5 kg)  09/03/20 123 lb  14.4 oz (56.2 kg)  07/27/20 124 lb (56.2 kg)     Exam deferred. Pt. Harboring due to COVID 19. Phone visit performed.   Assessment & Plan:   1. Essential hypertension   2. Type 2 diabetes mellitus without complication, with long-term current use of insulin ([place])   3. Syncope, unspecified syncope type     DC amlodipine.  Monitor blood pressure to make sure it does not go back above 130/80 while resting.  She should check it daily.  Also, if she has any more syncopal episodes she should notify me immediately.  However at the time she should check her blood sugar and blood pressure as soon as she comes to.   Home insurance nurse is going to be checking on her regularly and checking blood pressure and sugar in the meantime.  She has a follow-up appointment scheduled for mid September.  She will need to keep that.  However if any problems continue she will need to be seen sooner.    Virtual Visit via telephone Note  I discussed the limitations, risks, security and privacy concerns of performing an evaluation and management service by telephone and the availability of in person appointments. The patient was identified with two identifiers.  Pt.expressed understanding and agreed to proceed. Pt. Is at home. Dr. Livia Snellen is in his office.  Follow Up Instructions:   I discussed the assessment and treatment plan with the patient. The patient was provided an opportunity to ask questions and all were answered. The patient agreed with the plan and demonstrated an understanding of the instructions.   The patient was advised to call back or seek an in-person evaluation if the symptoms worsen or if the condition fails to improve as anticipated.   Total minutes including chart review and phone contact time: 26   Follow up plan: No follow-ups on file.  [name], MD [place]

## 2021-06-24 DIAGNOSIS — Z28310 Unvaccinated for covid-19: Secondary | ICD-10-CM | POA: Diagnosis not present

## 2021-06-24 DIAGNOSIS — E860 Dehydration: Secondary | ICD-10-CM | POA: Diagnosis not present

## 2021-06-24 DIAGNOSIS — E039 Hypothyroidism, unspecified: Secondary | ICD-10-CM | POA: Diagnosis not present

## 2021-06-24 DIAGNOSIS — Z853 Personal history of malignant neoplasm of breast: Secondary | ICD-10-CM | POA: Diagnosis not present

## 2021-06-24 DIAGNOSIS — E119 Type 2 diabetes mellitus without complications: Secondary | ICD-10-CM | POA: Diagnosis not present

## 2021-06-24 DIAGNOSIS — I959 Hypotension, unspecified: Secondary | ICD-10-CM | POA: Diagnosis not present

## 2021-06-24 DIAGNOSIS — R531 Weakness: Secondary | ICD-10-CM | POA: Diagnosis not present

## 2021-06-24 DIAGNOSIS — E785 Hyperlipidemia, unspecified: Secondary | ICD-10-CM | POA: Diagnosis not present

## 2021-06-24 DIAGNOSIS — I10 Essential (primary) hypertension: Secondary | ICD-10-CM | POA: Diagnosis not present

## 2021-06-24 DIAGNOSIS — R55 Syncope and collapse: Secondary | ICD-10-CM | POA: Diagnosis not present

## 2021-06-24 DIAGNOSIS — R42 Dizziness and giddiness: Secondary | ICD-10-CM | POA: Diagnosis not present

## 2021-06-24 DIAGNOSIS — I2510 Atherosclerotic heart disease of native coronary artery without angina pectoris: Secondary | ICD-10-CM | POA: Diagnosis not present

## 2021-06-27 ENCOUNTER — Telehealth: Payer: Self-pay | Admitting: Family Medicine

## 2021-06-27 DIAGNOSIS — Z794 Long term (current) use of insulin: Secondary | ICD-10-CM

## 2021-06-27 DIAGNOSIS — E039 Hypothyroidism, unspecified: Secondary | ICD-10-CM

## 2021-06-27 DIAGNOSIS — E782 Mixed hyperlipidemia: Secondary | ICD-10-CM

## 2021-06-27 DIAGNOSIS — I10 Essential (primary) hypertension: Secondary | ICD-10-CM

## 2021-06-27 NOTE — Telephone Encounter (Signed)
Pt. Needs to be seen for this.Try to get her in sooner than her next routine follow up Thanks, WS

## 2021-06-27 NOTE — Telephone Encounter (Signed)
Can she have the blood work before appt?

## 2021-06-28 NOTE — Telephone Encounter (Signed)
Sounds like a great plan. Thanks

## 2021-06-28 NOTE — Telephone Encounter (Signed)
Patient's daughter aware that lab work has been ordered.  Patient has an appointment on Friday and daughter will bring her in for labs tomorrow, two days before appointment.

## 2021-06-28 NOTE — Telephone Encounter (Signed)
She can have it 1-2 days before, but not as a separate evaluation event

## 2021-06-28 NOTE — Telephone Encounter (Signed)
Looks like she is due for her normal lab work, CBC, CMP, Lipid, A1C, and thyroid.  Are there any others you would like [place] to order?

## 2021-06-29 ENCOUNTER — Other Ambulatory Visit: Payer: Self-pay

## 2021-06-29 ENCOUNTER — Other Ambulatory Visit: Payer: Medicare Other

## 2021-06-29 DIAGNOSIS — Z794 Long term (current) use of insulin: Secondary | ICD-10-CM | POA: Diagnosis not present

## 2021-06-29 DIAGNOSIS — E119 Type 2 diabetes mellitus without complications: Secondary | ICD-10-CM | POA: Diagnosis not present

## 2021-06-29 DIAGNOSIS — I10 Essential (primary) hypertension: Secondary | ICD-10-CM

## 2021-06-29 DIAGNOSIS — E039 Hypothyroidism, unspecified: Secondary | ICD-10-CM

## 2021-06-29 DIAGNOSIS — E782 Mixed hyperlipidemia: Secondary | ICD-10-CM | POA: Diagnosis not present

## 2021-06-29 LAB — BAYER DCA HB A1C WAIVED: HB A1C (BAYER DCA - WAIVED): 6.2 % (ref <7.0)

## 2021-06-30 LAB — CBC WITH DIFFERENTIAL/PLATELET
Basophils Absolute: 0.1 10*3/uL (ref 0.0–0.2)
Basos: 1 %
EOS (ABSOLUTE): 0.4 10*3/uL (ref 0.0–0.4)
Eos: 6 %
Hematocrit: 31.3 % — ABNORMAL LOW (ref 34.0–46.6)
Hemoglobin: 10.6 g/dL — ABNORMAL LOW (ref 11.1–15.9)
Immature Grans (Abs): 0 10*3/uL (ref 0.0–0.1)
Immature Granulocytes: 0 %
Lymphocytes Absolute: 1.8 10*3/uL (ref 0.7–3.1)
Lymphs: 25 %
MCH: 28.8 pg (ref 26.6–33.0)
MCHC: 33.9 g/dL (ref 31.5–35.7)
MCV: 85 fL (ref 79–97)
Monocytes Absolute: 0.5 10*3/uL (ref 0.1–0.9)
Monocytes: 8 %
Neutrophils Absolute: 4.3 10*3/uL (ref 1.4–7.0)
Neutrophils: 60 %
Platelets: 322 10*3/uL (ref 150–450)
RBC: 3.68 x10E6/uL — ABNORMAL LOW (ref 3.77–5.28)
RDW: 12.7 % (ref 11.7–15.4)
WBC: 7.1 10*3/uL (ref 3.4–10.8)

## 2021-06-30 LAB — CMP14+EGFR
ALT: 13 IU/L (ref 0–32)
AST: 16 IU/L (ref 0–40)
Albumin/Globulin Ratio: 1.7 (ref 1.2–2.2)
Albumin: 4.1 g/dL (ref 3.7–4.7)
Alkaline Phosphatase: 124 IU/L — ABNORMAL HIGH (ref 44–121)
BUN/Creatinine Ratio: 22 (ref 12–28)
BUN: 26 mg/dL (ref 8–27)
Bilirubin Total: 0.4 mg/dL (ref 0.0–1.2)
CO2: 21 mmol/L (ref 20–29)
Calcium: 10.1 mg/dL (ref 8.7–10.3)
Chloride: 102 mmol/L (ref 96–106)
Creatinine, Ser: 1.18 mg/dL — ABNORMAL HIGH (ref 0.57–1.00)
Globulin, Total: 2.4 g/dL (ref 1.5–4.5)
Glucose: 161 mg/dL — ABNORMAL HIGH (ref 65–99)
Potassium: 4.2 mmol/L (ref 3.5–5.2)
Sodium: 142 mmol/L (ref 134–144)
Total Protein: 6.5 g/dL (ref 6.0–8.5)
eGFR: 49 mL/min/{1.73_m2} — ABNORMAL LOW (ref >59)

## 2021-06-30 LAB — LIPID PANEL
Chol/HDL Ratio: 2.7 ratio (ref 0.0–4.4)
Cholesterol, Total: 145 mg/dL (ref 100–199)
HDL: 53 mg/dL (ref >39)
LDL Chol Calc (NIH): 67 mg/dL (ref 0–99)
Triglycerides: 145 mg/dL (ref 0–149)
VLDL Cholesterol Cal: 25 mg/dL (ref 5–40)

## 2021-06-30 LAB — THYROID PANEL WITH TSH
Free Thyroxine Index: 3.7 (ref 1.2–4.9)
T3 Uptake Ratio: 33 % (ref 24–39)
T4, Total: 11.3 ug/dL (ref 4.5–12.0)
TSH: 0.009 u[IU]/mL — ABNORMAL LOW (ref 0.450–4.500)

## 2021-07-01 ENCOUNTER — Other Ambulatory Visit: Payer: Self-pay

## 2021-07-01 ENCOUNTER — Encounter: Payer: Self-pay | Admitting: Family Medicine

## 2021-07-01 ENCOUNTER — Ambulatory Visit (INDEPENDENT_AMBULATORY_CARE_PROVIDER_SITE_OTHER): Payer: Medicare Other | Admitting: Family Medicine

## 2021-07-01 VITALS — BP 116/67 | HR 73 | Temp 98.2°F | Ht 62.5 in | Wt 120.0 lb

## 2021-07-01 DIAGNOSIS — R296 Repeated falls: Secondary | ICD-10-CM | POA: Diagnosis not present

## 2021-07-01 DIAGNOSIS — Z794 Long term (current) use of insulin: Secondary | ICD-10-CM | POA: Diagnosis not present

## 2021-07-01 DIAGNOSIS — R42 Dizziness and giddiness: Secondary | ICD-10-CM | POA: Diagnosis not present

## 2021-07-01 DIAGNOSIS — G301 Alzheimer's disease with late onset: Secondary | ICD-10-CM

## 2021-07-01 DIAGNOSIS — F0280 Dementia in other diseases classified elsewhere without behavioral disturbance: Secondary | ICD-10-CM

## 2021-07-01 DIAGNOSIS — E119 Type 2 diabetes mellitus without complications: Secondary | ICD-10-CM | POA: Diagnosis not present

## 2021-07-01 DIAGNOSIS — E039 Hypothyroidism, unspecified: Secondary | ICD-10-CM | POA: Diagnosis not present
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
## 2021-07-05 ENCOUNTER — Telehealth: Payer: Self-pay | Admitting: Family Medicine

## 2021-07-05 NOTE — Addendum Note (Signed)
Addended by: [name] on: 07/05/2021 12:35 PM   Modules accepted: Orders

## 2021-07-05 NOTE — Telephone Encounter (Signed)
Patient's daughter is calling to see if they can get Home Health for Brianna Weber.  She is having memory issues and needs help with her medicines and eating.  Is this something they can get?  Please call.

## 2021-07-05 NOTE — Telephone Encounter (Signed)
Great idea. We actually discussed it at her office visit. I submitted a referral.

## 2021-07-05 NOTE — Telephone Encounter (Signed)
RETURNED CALL, NO ANSWER, LEFT MESSAGE TO CALL BACK 

## 2021-07-20 NOTE — Telephone Encounter (Signed)
No return call will close encounter. 

## 2021-07-25 ENCOUNTER — Ambulatory Visit: Payer: Medicare Other | Admitting: Family Medicine

## 2021-07-30 DIAGNOSIS — Z0100 Encounter for examination of eyes and vision without abnormal findings: Secondary | ICD-10-CM | POA: Diagnosis not present

## 2021-07-30 DIAGNOSIS — E109 Type 1 diabetes mellitus without complications: Secondary | ICD-10-CM | POA: Diagnosis not present

## 2021-08-10 ENCOUNTER — Telehealth: Payer: Self-pay | Admitting: Family Medicine

## 2021-08-10 NOTE — Telephone Encounter (Signed)
Patients daughter called to check on referral for Home Health.  Has not heard anything and mother is in need of some type of Home Health care.  If you would please call and update her on the status of this.

## 2021-09-05 ENCOUNTER — Ambulatory Visit (INDEPENDENT_AMBULATORY_CARE_PROVIDER_SITE_OTHER): Payer: Medicare Other

## 2021-09-05 VITALS — BP 139/62 | HR 61 | Ht 63.0 in | Wt 120.0 lb

## 2021-09-05 DIAGNOSIS — Z0000 Encounter for general adult medical examination without abnormal findings: Secondary | ICD-10-CM

## 2021-09-05 NOTE — Progress Notes (Signed)
Subjective:   Brianna Weber is a 74 y.o. female who presents for Medicare Annual (Subsequent) preventive examination.  Virtual Visit via Telephone Note  I connected with  Brianna Weber on 09/05/21 at 10:30 AM EDT by telephone and verified that I am speaking with the correct person using two identifiers.  Location: Patient: Home Provider: [facility] Persons participating in the virtual visit: patient/Nurse Health Advisor   I discussed the limitations, risks, security and privacy concerns of performing an evaluation and management service by telephone and the availability of in person appointments. The patient expressed understanding and agreed to proceed.  Interactive audio and video telecommunications were attempted between this nurse and patient, however failed, due to patient having technical difficulties OR patient did not have access to video capability.  We continued and completed visit with audio only.  Some vital signs may be absent or patient reported.   Brianna Jensen E Bronco Mcgrory, LPN   Review of Systems     Cardiac Risk Factors include: advanced age (>34men, >49 women);diabetes mellitus;sedentary lifestyle;dyslipidemia;hypertension     Objective:    Today's Vitals   09/05/21 1034  BP: 139/62  Pulse: 61  Weight: 120 lb (54.4 kg)  Height: $Remove'5\' 3"'jWmeKrq$  (1.6 m)   Body mass index is 21.26 kg/m.  Advanced Directives 09/05/2021 09/03/2020 09/02/2019 08/27/2018 08/26/2018 10/02/2016 09/16/2014  Does Patient Have a Medical Advance Directive? Yes Yes Yes Yes Yes Yes Yes  Type of Paramedic of [place];Living will [place];Living will Living will Living will Living will Living will;Healthcare Power of Attorney -  Does patient want to make changes to medical advance directive? - No - Patient declined No - Patient declined No - Patient declined - - -  Copy of [place] in Chart? No - copy requested No - copy requested - - - No  - copy requested No - copy requested  Would patient like information on creating a medical advance directive? - - - No - Patient declined No - Patient declined - -    Current Medications (verified) Outpatient Encounter Medications as of 09/05/2021  Medication Sig   aspirin 81 MG tablet Take 81 mg by mouth daily.   cholecalciferol (VITAMIN D3) 25 MCG (1000 UNIT) tablet Take 1,000 Units by mouth daily.   glucose blood (ONETOUCH ULTRA) test strip CHECK BLOOD SUGAR 2 TIMES A DAY Dx E11.9   insulin glargine (LANTUS SOLOSTAR) 100 UNIT/ML Solostar Pen Inject 12 Units into the skin at bedtime. Discontinue 70/30 insulin.   Insulin Pen Needle (PEN NEEDLES) 31G X 5 MM MISC Use to give insulin daily Dx E11.9   isosorbide mononitrate (IMDUR) 60 MG 24 hr tablet Take 1 tablet (60 mg total) by mouth daily.   levothyroxine (SYNTHROID) 150 MCG tablet Take 1 tablet (150 mcg total) by mouth daily before breakfast.   simvastatin (ZOCOR) 10 MG tablet TAKE 1 TABLET DAILY   vitamin B-12 (CYANOCOBALAMIN) 500 MCG tablet Take 500 mcg by mouth daily.   vitamin E 200 UNIT capsule Take 200 Units by mouth daily.   No facility-administered encounter medications on file as of 09/05/2021.    Allergies (verified) Patient has no known allergies.   History: Past Medical History:  Diagnosis Date   Allergy    Arrhythmia    bradycardia   Breast cancer ([place])    s/p left mastectomy   Diabetes mellitus    Diverticulosis of colon    Hyperlipidemia    Hypertension  NSTEMI (non-ST elevated myocardial infarction) ([place])    2004 secondary to stress induced cardiomyopathy. Normal LV function by repeatr echo.   Peripheral edema    Thyroid disease    hypothyroidism   Vitamin D deficiency    Past Surgical History:  Procedure Laterality Date   ABDOMINAL HYSTERECTOMY     CHOLECYSTECTOMY     MASTECTOMY     left   Family History  Problem Relation Age of Onset   Hypertension Mother    Heart disease Mother    Heart  attack Father    Heart disease Sister    Heart disease Brother    Cancer Maternal Aunt        breast    Heart disease Maternal Aunt    Hyperlipidemia Maternal Aunt    Hypertension Maternal Aunt    Heart disease Maternal Uncle    Hyperlipidemia Maternal Uncle    Hypertension Maternal Uncle    Heart disease Maternal Grandmother    Heart disease Sister    Heart disease Sister    Heart disease Brother    Diabetes Daughter    Hypertension Other    Social History   Socioeconomic History   Marital status: Widowed    Spouse name: Not on file   Number of children: 4   Years of education: 9   Highest education level: 9th grade  Occupational History   Occupation: retired  Tobacco Use   Smoking status: Never   Smokeless tobacco: Never  Vaping Use   Vaping Use: Never used  Substance and Sexual Activity   Alcohol use: No   Drug use: No   Sexual activity: Not Currently    Birth control/protection: Surgical  Other Topics Concern   Not on file  Social History Narrative   Pt is right handed   Has 4 biological children and 4 step children   9th grade education   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, water quality at [employer]      09/02/19-pt states she lives alone    Has 9th grade education      No longer drives - daughter and sister take her to appointments and shopping   Social Determinants of Health   Financial Resource Strain: Low Risk    Difficulty of Paying Living Expenses: Not hard at all  Food Insecurity: No Food Insecurity   Worried About Charity fundraiser in the Last Year: Never true   Arboriculturist in the Last Year: Never true  Transportation Needs: No Transportation Needs   Lack of Transportation (Medical): No   Lack of Transportation (Non-Medical): No  Physical Activity: Insufficiently Active   Days of Exercise per Week: 7 days   Minutes of Exercise per Session: 10 min  Stress: No Stress Concern Present   Feeling of Stress : Only a little  Social Connections: Socially Isolated    Frequency of Communication with Friends and Family: More than three times a week   Frequency of Social Gatherings with Friends and Family: Once a week   Attends Religious Services: Never   Marine scientist or Organizations: No   Attends Archivist Meetings: Never   Marital Status: Widowed    Tobacco Counseling Counseling given: Not Answered   Clinical Intake:  Pre-visit preparation completed: Yes  Pain : No/denies pain     BMI - recorded: 21.26 Nutritional Status: BMI of 19-24  Normal Nutritional Risks: None Diabetes: Yes CBG done?: No Did pt. bring in CBG monitor from home?: No  How often do you need to have someone help you when you read instructions, pamphlets, or other written materials from your doctor or pharmacy?: 1 - Never  Diabetic? Yes Nutrition Risk Assessment:  Has the patient had any N/V/D within the last 2 months?  No  Does the patient have any non-healing wounds?  No  Has the patient had any unintentional weight loss or weight gain?  No   Diabetes:  Is the patient diabetic?  Yes  If diabetic, was a CBG obtained today?  No  Did the patient bring in their glucometer from home?  No  How often do you monitor your CBG's? BID.   Financial Strains and Diabetes Management:  Are you having any financial strains with the device, your supplies or your medication? No .  Does the patient want to be seen by Chronic Care Management for management of their diabetes?  No  Would the patient like to be referred to a Nutritionist or for Diabetic Management?  No   Diabetic Exams:  Diabetic Eye Exam: Completed 07/17/2019. Overdue for diabetic eye exam. Pt has been advised about the importance in completing this exam.   Diabetic Foot Exam: Completed 12/17/2017. Pt has been advised about the importance in completing this exam. Pt is scheduled for diabetic foot exam on next visit.    Interpreter Needed?: No  Information entered by :: Linda Biehn,  LPN   Activities of Daily Living In your present state of health, do you have any difficulty performing the following activities: 09/05/2021  Hearing? N  Vision? N  Difficulty concentrating or making decisions? Y  Comment she writes everything down  Walking or climbing stairs? Y  Dressing or bathing? N  Doing errands, shopping? Y  Comment doesn't drive - sister or daughter drive her  Preparing Food and eating ? N  Using the Toilet? N  In the past six months, have you accidently leaked urine? N  Do you have problems with loss of bowel control? N  Managing your Medications? N  Managing your Finances? N  Housekeeping or managing your Housekeeping? N  Some recent data might be hidden    Patient Care Team: [name], MD as PCP - General (Family Medicine) Ilean China, RN as Case Manager [place], Royce Macadamia, [facility] (Pharmacist)  Indicate any recent Medical Services you may have received from other than [facility] providers in the past year (date may be approximate).     Assessment:   This is a routine wellness examination for Marlise.  Hearing/Vision screen Hearing Screening - Comments:: Denies hearing difficulties Vision Screening - Comments:: Wears reading glasses prn only - behind on annual eye exams  Dietary issues and exercise activities discussed: Current Exercise Habits: Home exercise routine, Type of exercise: walking, Time (Minutes): 10, Frequency (Times/Week): 7, Weekly Exercise (Minutes/Week): 70, Intensity: Mild, Exercise limited by: cardiac condition(s)   Goals Addressed               This Visit's Progress     Exercise 150 min/wk Moderate Activity   Not on track     09/03/2020 AWV Goal: Exercise for General Health  Patient will verbalize understanding of the benefits of increased physical activity: Exercising regularly is important. It will improve your overall fitness, flexibility, and endurance. Regular exercise also will improve your overall health. It  can help you control your weight, reduce stress, and improve your bone density. Over the next year, patient will increase physical activity as tolerated with a goal of  at least 150 minutes of moderate physical activity per week.  You can tell that you are exercising at a moderate intensity if your heart starts beating faster and you start breathing faster but can still hold a conversation. Moderate-intensity exercise ideas include: Walking 1 mile (1.6 km) in about 15 minutes Biking Hiking Golfing Dancing Water aerobics Patient will verbalize understanding of everyday activities that increase physical activity by providing examples like the following: Yard work, such as: Sales promotion account executive Gardening Washing windows or floors Patient will be able to explain general safety guidelines for exercising:  Before you start a new exercise program, talk with your health care provider. Do not exercise so much that you hurt yourself, feel dizzy, or get very short of breath. Wear comfortable clothes and wear shoes with good support. Drink plenty of water while you exercise to prevent dehydration or heat stroke. Work out until your breathing and your heartbeat get faster.       I would like to avoid hypoglycemia (pt-stated)   On track     [place] (see longitudinal plan of care for additional care plan information)  Current Barriers:  Social, financial, community barriers:  Diabetes: T3MI; complicated by chronic medical conditions including HTN,HLD most recent A1c 5.8% Most recent eGFR: 37 Current antihyperglycemic regimen: insulin 70/30 decreased to 10 units twice daily Reports hypoglycemic symptoms, including dizziness, lightheadedness, shaking, sweating--mostly at night, will transition patient to basal insulin at bedtime only. She has had rebound hyperglycemia when taken off of insulin in the past.  Not a  great candidate for GLP1 Denies hyperglycemic symptoms Current meal patterns: Breakfast: n/a Lunch:chili/crackers,porkchop sandwich, pb & j sandwiches Supper: taco salads, bbq chicken--daughter is helping to prep better meals for patient  Snacks:n/a Drinks:n/a Current exercise: n/a Current blood glucose readings: FBG 130-160s, droppping low in the 60-70s if she is not eating steady meals (mostly at night time) Cardiovascular risk reduction: Current hypertensive regimen: ACEi on hold d/t kidney, amlodipine Current hyperlipidemia regimen: simvastatin Current antiplatelet regimen: n/a  Pharmacist Clinical Goal(s):  Over the next 90 days, patient will work with PharmD and primary care provider to address needs related to DM management; avoiding hypoglycemia  Interventions: Comprehensive medication review performed, medication list updated in electronic medical record Inter-disciplinary care team collaboration (see longitudinal plan of care) Transitioning patient to basal insulin from 70/30--having hypoglycemia overnight or during the day when not eating  Patient Self Care Activities:  Patient will check blood glucose 2 times daily , document, and provide at future appointments Patient will focus on medication adherence by continuing to take medication as prescribed Patient will take medications as prescribed Patient will contact provider with any episodes of hypoglycemia Patient will report any questions or concerns to provider   Initial goal documentation        Depression Screen PHQ 2/9 Scores 09/05/2021 11/24/2020 07/27/2020 09/02/2019 01/27/2019 09/16/2018 08/30/2018  PHQ - 2 Score 0 0 0 0 $R'2 2 2  'xz$ PHQ- 9 Score - - - - $R'3 2 5    'Qg$ Fall Risk Fall Risk  09/05/2021 11/24/2020 09/03/2020 07/27/2020 09/02/2019  Falls in the past year? 1 0 0 0 0  Comment - - - - -  Number falls in past yr: 0 - 0 - 0  Injury with Fall? 0 - 0 - 0  Risk for fall due to : History of fall(s);Mental status  change - No Fall Risks -  Mental status change  Risk for fall due to: Comment - - - - pt has mild cognitive impairment which she follows up with Dr Delice Lesch for  Follow up Falls prevention discussed Falls evaluation completed Falls evaluation completed Falls evaluation completed Falls prevention discussed  Comment - - - - Get rid of all throw rugs in the house, adequate lighting in the walkways and grab bars in the bathroom    [place]:  Any stairs in or around the home? No  If so, are there any without handrails? No  Home free of loose throw rugs in walkways, pet beds, electrical cords, etc? Yes  Adequate lighting in your home to reduce risk of falls? Yes   ASSISTIVE DEVICES UTILIZED TO PREVENT FALLS:  Life alert? No  Use of a cane, walker or w/c?  Prn uses walker Grab bars in the bathroom? Yes  Shower chair or bench in shower? Yes  Elevated toilet seat or a handicapped toilet? Yes   TIMED UP AND GO:  Was the test performed? No . Telephonic visit  Cognitive Function: MMSE - Mini Mental State Exam 07/01/2021 11/15/2018 10/02/2016 10/01/2015  Orientation to time 3 4 5 4   Orientation to Place 3 5 5 5   Registration 3 3 3 3   Attention/ Calculation 4 4 5 4   Recall 2 0 0 2  Language- name 2 objects 2 2 2 2   Language- repeat 1 1 1 1   Language- follow 3 step command 2 3 3 3   Language- read & follow direction 1 1 1 1   Write a sentence 1 1 1 1   Copy design 1 1 1 1   Total score 23 25 27 27    Montreal Cognitive Assessment  06/04/2019  Attention: Read list of digits (0/2) 1  Attention: Read list of letters (0/1) 1  Attention: Serial 7 subtraction starting at 100 (0/3) 0  Language: Repeat phrase (0/2) 0  Language : Fluency (0/1) 1  Abstraction (0/2) 2  Delayed Recall (0/5) 0  Orientation (0/6) 5   6CIT Screen 09/05/2021 09/03/2020 09/02/2019  What Year? 0 points 0 points 0 points  What month? 0 points 0 points 0 points  What time? 0 points 0 points 0  points  Count back from 20 0 points - 0 points  Months in reverse 4 points 4 points 4 points  Repeat phrase 8 points 10 points 6 points  Total Score 12 - 10    Immunizations Immunization History  Administered Date(s) Administered   Influenza, High Dose Seasonal PF 09/06/2017   Influenza,inj,Quad PF,6+ Mos 09/16/2014, 08/12/2015, 08/22/2016   Pneumococcal Conjugate-13 12/25/2014   Pneumococcal Polysaccharide-23 03/17/2013    TDAP status: Due, Education has been provided regarding the importance of this vaccine. Advised may receive this vaccine at local pharmacy or Health Dept. Aware to provide a copy of the vaccination record if obtained from local pharmacy or Health Dept. Verbalized acceptance and understanding.  Flu Vaccine status: Due, Education has been provided regarding the importance of this vaccine. Advised may receive this vaccine at local pharmacy or Health Dept. Aware to provide a copy of the vaccination record if obtained from local pharmacy or Health Dept. Verbalized acceptance and understanding.  Pneumococcal vaccine status: Up to date  Covid-19 vaccine status: Information provided on how to obtain vaccines.   Qualifies for Shingles Vaccine? Yes   Zostavax completed No   Shingrix Completed?: No.    Education has been provided regarding the importance of this  vaccine. Patient has been advised to call insurance company to determine out of pocket expense if they have not yet received this vaccine. Advised may also receive vaccine at local pharmacy or Health Dept. Verbalized acceptance and understanding.  Screening Tests Health Maintenance  Topic Date Due   COVID-19 Vaccine (1) Never done   Hepatitis C Screening  Never done   Zoster Vaccines- Shingrix (1 of 2) Never done   DEXA SCAN  03/25/2017   FOOT EXAM  12/17/2018   OPHTHALMOLOGY EXAM  07/15/2020   INFLUENZA VACCINE  06/13/2021   URINE MICROALBUMIN  07/27/2021   MAMMOGRAM  09/21/2021   COLONOSCOPY (Pts 45-62yrs  Insurance coverage will need to be confirmed)  12/25/2021   HEMOGLOBIN A1C  12/30/2021   TETANUS/TDAP  11/01/2023   Pneumonia Vaccine 31+ Years old  Completed   HPV VACCINES  Aged Out    Health Maintenance  Health Maintenance Due  Topic Date Due   COVID-19 Vaccine (1) Never done   Hepatitis C Screening  Never done   Zoster Vaccines- Shingrix (1 of 2) Never done   DEXA SCAN  03/25/2017   FOOT EXAM  12/17/2018   OPHTHALMOLOGY EXAM  07/15/2020   INFLUENZA VACCINE  06/13/2021   URINE MICROALBUMIN  07/27/2021    Colorectal cancer screening: Type of screening: Colonoscopy. Completed 12/26/2011. Repeat every 10 years  Mammogram status: Completed 09/21/2020. Repeat every year  Bone Density status: Completed 03/26/2015. Results reflect: Bone density results: OSTEOPOROSIS. Repeat every 2 years.  Lung Cancer Screening: (Low Dose CT Chest recommended if Age 28-80 years, 30 pack-year currently smoking OR have quit w/in 15years.) does not qualify.   Additional Screening:  Hepatitis C Screening: does qualify; DUE  Vision Screening: Recommended annual ophthalmology exams for early detection of glaucoma and other disorders of the eye. Is the patient up to date with their annual eye exam?  No  Who is the provider or what is the name of the office in which the patient attends annual eye exams? unknown If pt is not established with a provider, would they like to be referred to a provider to establish care? No .   Dental Screening: Recommended annual dental exams for proper oral hygiene  Community Resource Referral / Chronic Care Management: CRR required this visit?  No   CCM required this visit?  No      Plan:     I have personally reviewed and noted the following in the patient's chart:   Medical and social history Use of alcohol, tobacco or illicit drugs  Current medications and supplements including opioid prescriptions.  Functional ability and status Nutritional status Physical  activity Advanced directives List of other physicians Hospitalizations, surgeries, and ER visits in previous 12 months Vitals Screenings to include cognitive, depression, and falls Referrals and appointments  In addition, I have reviewed and discussed with patient certain preventive protocols, quality metrics, and best practice recommendations. A written personalized care plan for preventive services as well as general preventive health recommendations were provided to patient.     Sandrea Hammond, LPN   [id number]   Nurse Notes: Not sure how accurate this visit is, as patient was alone and seems to have memory deficit.

## 2021-09-05 NOTE — Patient Instructions (Signed)
Brianna Weber , Thank you for taking time to come for your Medicare Wellness Visit. I appreciate your ongoing commitment to your health goals. Please review the following plan we discussed and let me know if I can assist you in the future.   Screening recommendations/referrals: Colonoscopy: Done 12/26/2011 - Repeat in 10 years  Mammogram: Done 09/21/2020 - Repeat annually Bone Density: Done 03/26/2015 - Repeat every 2 years  Recommended yearly ophthalmology/optometry visit for glaucoma screening and checkup Recommended yearly dental visit for hygiene and checkup  Vaccinations: Influenza vaccine: Due. Every fall Pneumococcal vaccine: Done 03/17/2013 & 12/25/2014 Tdap vaccine: Due. Every 10 years Shingles vaccine: Due   Covid-19: Due  Advanced directives: Please bring a copy of your health care power of attorney and living will to the office to be added to your chart at your convenience.   Conditions/risks identified: Aim for 30 minutes of exercise or brisk walking each day, drink 6-8 glasses of water and eat lots of fruits and vegetables.   Next appointment: Follow up in one year for your annual wellness visit    Preventive Care 65 Years and Older, Female Preventive care refers to lifestyle choices and visits with your health care provider that can promote health and wellness. What does preventive care include? A yearly physical exam. This is also called an annual well check. Dental exams once or twice a year. Routine eye exams. Ask your health care provider how often you should have your eyes checked. Personal lifestyle choices, including: Daily care of your teeth and gums. Regular physical activity. Eating a healthy diet. Avoiding tobacco and drug use. Limiting alcohol use. Practicing safe sex. Taking low-dose aspirin every day. Taking vitamin and mineral supplements as recommended by your health care provider. What happens during an annual well check? The services and screenings  done by your health care provider during your annual well check will depend on your age, overall health, lifestyle risk factors, and family history of disease. Counseling  Your health care provider may ask you questions about your: Alcohol use. Tobacco use. Drug use. Emotional well-being. Home and relationship well-being. Sexual activity. Eating habits. History of falls. Memory and ability to understand (cognition). Work and work Statistician. Reproductive health. Screening  You may have the following tests or measurements: Height, weight, and BMI. Blood pressure. Lipid and cholesterol levels. These may be checked every 5 years, or more frequently if you are over 69 years old. Skin check. Lung cancer screening. You may have this screening every year starting at age 7 if you have a 30-pack-year history of smoking and currently smoke or have quit within the past 15 years. Fecal occult blood test (FOBT) of the stool. You may have this test every year starting at age 76. Flexible sigmoidoscopy or colonoscopy. You may have a sigmoidoscopy every 5 years or a colonoscopy every 10 years starting at age 15. Hepatitis C blood test. Hepatitis B blood test. Sexually transmitted disease (STD) testing. Diabetes screening. This is done by checking your blood sugar (glucose) after you have not eaten for a while (fasting). You may have this done every 1-3 years. Bone density scan. This is done to screen for osteoporosis. You may have this done starting at age 58. Mammogram. This may be done every 1-2 years. Talk to your health care provider about how often you should have regular mammograms. Talk with your health care provider about your test results, treatment options, and if necessary, the need for more tests. Vaccines  Your health  care provider may recommend certain vaccines, such as: Influenza vaccine. This is recommended every year. Tetanus, diphtheria, and acellular pertussis (Tdap, Td)  vaccine. You may need a Td booster every 10 years. Zoster vaccine. You may need this after age 47. Pneumococcal 13-valent conjugate (PCV13) vaccine. One dose is recommended after age 34. Pneumococcal polysaccharide (PPSV23) vaccine. One dose is recommended after age 45. Talk to your health care provider about which screenings and vaccines you need and how often you need them. This information is not intended to replace advice given to you by your health care provider. Make sure you discuss any questions you have with your health care provider. Document Released: 11/26/2015 Document Revised: 07/19/2016 Document Reviewed: 08/31/2015 Elsevier Interactive Patient Education  2017 [place] Prevention in the Home Falls can cause injuries. They can happen to people of all ages. There are many things you can do to make your home safe and to help prevent falls. What can I do on the outside of my home? Regularly fix the edges of walkways and driveways and fix any cracks. Remove anything that might make you trip as you walk through a door, such as a raised step or threshold. Trim any bushes or trees on the path to your home. Use bright outdoor lighting. Clear any walking paths of anything that might make someone trip, such as rocks or tools. Regularly check to see if handrails are loose or broken. Make sure that both sides of any steps have handrails. Any raised decks and porches should have guardrails on the edges. Have any leaves, snow, or ice cleared regularly. Use sand or salt on walking paths during winter. Clean up any spills in your garage right away. This includes oil or grease spills. What can I do in the bathroom? Use night lights. Install grab bars by the toilet and in the tub and shower. Do not use towel bars as grab bars. Use non-skid mats or decals in the tub or shower. If you need to sit down in the shower, use a plastic, non-slip stool. Keep the floor dry. Clean up any  water that spills on the floor as soon as it happens. Remove soap buildup in the tub or shower regularly. Attach bath mats securely with double-sided non-slip rug tape. Do not have throw rugs and other things on the floor that can make you trip. What can I do in the bedroom? Use night lights. Make sure that you have a light by your bed that is easy to reach. Do not use any sheets or blankets that are too big for your bed. They should not hang down onto the floor. Have a firm chair that has side arms. You can use this for support while you get dressed. Do not have throw rugs and other things on the floor that can make you trip. What can I do in the kitchen? Clean up any spills right away. Avoid walking on wet floors. Keep items that you use a lot in easy-to-reach places. If you need to reach something above you, use a strong step stool that has a grab bar. Keep electrical cords out of the way. Do not use floor polish or wax that makes floors slippery. If you must use wax, use non-skid floor wax. Do not have throw rugs and other things on the floor that can make you trip. What can I do with my stairs? Do not leave any items on the stairs. Make sure that there are handrails on  both sides of the stairs and use them. Fix handrails that are broken or loose. Make sure that handrails are as long as the stairways. Check any carpeting to make sure that it is firmly attached to the stairs. Fix any carpet that is loose or worn. Avoid having throw rugs at the top or bottom of the stairs. If you do have throw rugs, attach them to the floor with carpet tape. Make sure that you have a light switch at the top of the stairs and the bottom of the stairs. If you do not have them, ask someone to add them for you. What else can I do to help prevent falls? Wear shoes that: Do not have high heels. Have rubber bottoms. Are comfortable and fit you well. Are closed at the toe. Do not wear sandals. If you use a  stepladder: Make sure that it is fully opened. Do not climb a closed stepladder. Make sure that both sides of the stepladder are locked into place. Ask someone to hold it for you, if possible. Clearly mark and make sure that you can see: Any grab bars or handrails. First and last steps. Where the edge of each step is. Use tools that help you move around (mobility aids) if they are needed. These include: Canes. Walkers. Scooters. Crutches. Turn on the lights when you go into a dark area. Replace any light bulbs as soon as they burn out. Set up your furniture so you have a clear path. Avoid moving your furniture around. If any of your floors are uneven, fix them. If there are any pets around you, be aware of where they are. Review your medicines with your doctor. Some medicines can make you feel dizzy. This can increase your chance of falling. Ask your doctor what other things that you can do to help prevent falls. This information is not intended to replace advice given to you by your health care provider. Make sure you discuss any questions you have with your health care provider. Document Released: 08/26/2009 Document Revised: 04/06/2016 Document Reviewed: 12/04/2014 Elsevier Interactive Patient Education  2017 [employer].

## 2021-09-21 ENCOUNTER — Other Ambulatory Visit: Payer: Self-pay | Admitting: Family Medicine

## 2021-09-26 ENCOUNTER — Other Ambulatory Visit: Payer: Self-pay | Admitting: Family Medicine

## 2021-09-26 DIAGNOSIS — Z1231 Encounter for screening mammogram for malignant neoplasm of breast: Secondary | ICD-10-CM

## 2021-10-03 ENCOUNTER — Other Ambulatory Visit: Payer: Self-pay

## 2021-10-03 ENCOUNTER — Encounter: Payer: Self-pay | Admitting: Family Medicine

## 2021-10-03 ENCOUNTER — Ambulatory Visit (INDEPENDENT_AMBULATORY_CARE_PROVIDER_SITE_OTHER): Payer: Medicare Other | Admitting: Family Medicine

## 2021-10-03 VITALS — BP 150/66 | HR 73 | Temp 98.3°F | Ht 63.0 in | Wt 128.6 lb

## 2021-10-03 DIAGNOSIS — E119 Type 2 diabetes mellitus without complications: Secondary | ICD-10-CM | POA: Diagnosis not present

## 2021-10-03 DIAGNOSIS — E039 Hypothyroidism, unspecified: Secondary | ICD-10-CM

## 2021-10-03 DIAGNOSIS — Z23 Encounter for immunization: Secondary | ICD-10-CM | POA: Diagnosis not present

## 2021-10-03 DIAGNOSIS — Z794 Long term (current) use of insulin: Secondary | ICD-10-CM

## 2021-10-03 DIAGNOSIS — I10 Essential (primary) hypertension: Secondary | ICD-10-CM

## 2021-10-03 DIAGNOSIS — E782 Mixed hyperlipidemia: Secondary | ICD-10-CM | POA: Diagnosis not present

## 2021-10-03 LAB — CMP14+EGFR
ALT: 14 IU/L (ref 0–32)
AST: 19 IU/L (ref 0–40)
Albumin/Globulin Ratio: 1.7 (ref 1.2–2.2)
Albumin: 3.9 g/dL (ref 3.7–4.7)
Alkaline Phosphatase: 123 IU/L — ABNORMAL HIGH (ref 44–121)
BUN/Creatinine Ratio: 22 (ref 12–28)
BUN: 26 mg/dL (ref 8–27)
Bilirubin Total: 0.4 mg/dL (ref 0.0–1.2)
CO2: 24 mmol/L (ref 20–29)
Calcium: 9.6 mg/dL (ref 8.7–10.3)
Chloride: 107 mmol/L — ABNORMAL HIGH (ref 96–106)
Creatinine, Ser: 1.19 mg/dL — ABNORMAL HIGH (ref 0.57–1.00)
Globulin, Total: 2.3 g/dL (ref 1.5–4.5)
Glucose: 162 mg/dL — ABNORMAL HIGH (ref 70–99)
Potassium: 4.4 mmol/L (ref 3.5–5.2)
Sodium: 143 mmol/L (ref 134–144)
Total Protein: 6.2 g/dL (ref 6.0–8.5)
eGFR: 48 mL/min/{1.73_m2} — ABNORMAL LOW (ref >59)

## 2021-10-03 LAB — CBC WITH DIFFERENTIAL/PLATELET
Basophils Absolute: 0.1 10*3/uL (ref 0.0–0.2)
Basos: 1 %
EOS (ABSOLUTE): 0.4 10*3/uL (ref 0.0–0.4)
Eos: 6 %
Hematocrit: 35 % (ref 34.0–46.6)
Hemoglobin: 11.6 g/dL (ref 11.1–15.9)
Immature Grans (Abs): 0 10*3/uL (ref 0.0–0.1)
Immature Granulocytes: 0 %
Lymphocytes Absolute: 1.5 10*3/uL (ref 0.7–3.1)
Lymphs: 24 %
MCH: 28 pg (ref 26.6–33.0)
MCHC: 33.1 g/dL (ref 31.5–35.7)
MCV: 85 fL (ref 79–97)
Monocytes Absolute: 0.5 10*3/uL (ref 0.1–0.9)
Monocytes: 9 %
Neutrophils Absolute: 3.6 10*3/uL (ref 1.4–7.0)
Neutrophils: 60 %
Platelets: 316 10*3/uL (ref 150–450)
RBC: 4.14 x10E6/uL (ref 3.77–5.28)
RDW: 11.9 % (ref 11.7–15.4)
WBC: 6.1 10*3/uL (ref 3.4–10.8)

## 2021-10-03 LAB — LIPID PANEL
Chol/HDL Ratio: 2.4 ratio (ref 0.0–4.4)
Cholesterol, Total: 166 mg/dL (ref 100–199)
HDL: 68 mg/dL (ref >39)
LDL Chol Calc (NIH): 82 mg/dL (ref 0–99)
Triglycerides: 86 mg/dL (ref 0–149)
VLDL Cholesterol Cal: 16 mg/dL (ref 5–40)

## 2021-10-03 LAB — TSH+FREE T4
Free T4: 1.5 ng/dL (ref 0.82–1.77)
TSH: 0.054 u[IU]/mL — ABNORMAL LOW (ref 0.450–4.500)

## 2021-10-03 LAB — BAYER DCA HB A1C WAIVED: HB A1C (BAYER DCA - WAIVED): 4.7 % — ABNORMAL LOW (ref 4.8–5.6)

## 2021-10-03 MED ORDER — PEN NEEDLES 31G X 5 MM MISC: 3 refills | Status: AC

## 2021-10-03 MED ORDER — SIMVASTATIN 10 MG PO TABS: 10.0000 mg | ORAL_TABLET | Freq: Every day | ORAL | 3 refills | Status: DC

## 2021-10-03 MED ORDER — LANTUS SOLOSTAR 100 UNIT/ML ~~LOC~~ SOPN
10.0000 [IU] | PEN_INJECTOR | Freq: Every day | SUBCUTANEOUS | 11 refills | Status: DC
Start: 2021-10-03 — End: 2021-10-03

## 2021-10-03 MED ORDER — BENAZEPRIL HCL 20 MG PO TABS: 20.0000 mg | ORAL_TABLET | Freq: Every day | ORAL | 2 refills | Status: DC

## 2021-10-03 MED ORDER — LEVOTHYROXINE SODIUM 150 MCG PO TABS: 150.0000 ug | ORAL_TABLET | Freq: Every day | ORAL | 2 refills | Status: DC

## 2021-10-03 MED ORDER — ISOSORBIDE MONONITRATE ER 60 MG PO TB24: 60.0000 mg | ORAL_TABLET | Freq: Every day | ORAL | 2 refills | Status: AC

## 2021-10-03 MED ORDER — ONETOUCH ULTRA VI STRP: ORAL_STRIP | 3 refills | Status: AC

## 2021-10-03 NOTE — Progress Notes (Signed)
Subjective:  Patient ID: Brianna Weber,  female    DOB: [birth year]  Age: 75 y.o.    CC: Medical Management of Chronic Issues   HPI Brianna Weber presents for  follow-up of hypertension. Patient has no history of headache chest pain or shortness of breath or recent cough. Patient also denies symptoms of TIA such as numbness weakness lateralizing. Patient denies side effects from medication. States taking it regularly.  Patient also  in for follow-up of elevated cholesterol. Doing well without complaints on current medication. Denies side effects  including myalgia and arthralgia and nausea. Also in today for liver function testing. Currently no chest pain, shortness of breath or other cardiovascular related symptoms noted.  Follow-up of diabetes. Patient does check blood sugar at home. Doing great. Log not done. Patient denies symptoms such as excessive hunger or urinary frequency, excessive hunger, nausea No significant hypoglycemic spells noted. Medications reviewed. Pt reports taking them regularly. Pt. denies complication/adverse reaction today.   Feels sad over inability to reach kids. Daughter with her today. Requesting status of home health aide. [name] didn't recognize her stepson today.  Crocheting a lot.  History Brianna Weber has a past medical history of Allergy, Arrhythmia, Breast cancer (HCC), Diabetes mellitus, Diverticulosis of colon, Hyperlipidemia, Hypertension, NSTEMI (non-ST elevated myocardial infarction) (HCC), Peripheral edema, Thyroid disease, and Vitamin D deficiency.   She has a past surgical history that includes Mastectomy; Cholecystectomy; and Abdominal hysterectomy.   Her family history includes Cancer in her maternal aunt; Diabetes in her daughter; Heart attack in her father; Heart disease in her brother, brother, maternal aunt, maternal grandmother, maternal uncle, mother, sister, sister, and sister; Hyperlipidemia in her maternal aunt and  maternal uncle; Hypertension in her maternal aunt, maternal uncle, mother, and another family member.She reports that she has never smoked. She has never used smokeless tobacco. She reports that she does not drink alcohol and does not use drugs.  Current Outpatient Medications on File Prior to Visit  Medication Sig Dispense Refill   aspirin 81 MG tablet Take 81 mg by mouth daily.     cholecalciferol (VITAMIN D3) 25 MCG (1000 UNIT) tablet Take 1,000 Units by mouth daily.     vitamin B-12 (CYANOCOBALAMIN) 500 MCG tablet Take 500 mcg by mouth daily.     vitamin E 200 UNIT capsule Take 200 Units by mouth daily.     No current facility-administered medications on file prior to visit.    ROS Review of Systems  Constitutional: Negative.   HENT: Negative.    Eyes:  Negative for visual disturbance.  Respiratory:  Negative for shortness of breath.   Cardiovascular:  Negative for chest pain.  Gastrointestinal:  Negative for abdominal pain.  Musculoskeletal:  Negative for arthralgias.   Objective:  BP (!) 150/66   Pulse 73   Temp 98.3 F (36.8 C)   Ht 5\' 3"  (1.6 m)   Wt 128 lb 9.6 oz (58.3 kg)   SpO2 98%   BMI 22.78 kg/m   BP Readings from Last 3 Encounters:  10/03/21 (!) 150/66  09/05/21 139/62  07/01/21 116/67    Wt Readings from Last 3 Encounters:  10/03/21 128 lb 9.6 oz (58.3 kg)  09/05/21 120 lb (54.4 kg)  07/01/21 120 lb (54.4 kg)     Physical Exam Constitutional:      General: She is not in acute distress.    Appearance: She is well-developed.  Cardiovascular:     Rate and Rhythm: Normal rate and  regular rhythm.  Pulmonary:     Breath sounds: Normal breath sounds.  Musculoskeletal:        General: Normal range of motion.  Skin:    General: Skin is warm and dry.  Neurological:     Mental Status: She is alert and oriented to person, place, and time.    Diabetic Foot Exam - Simple   No data filed       Assessment & Plan:   Brianna Weber was seen today for  medical management of chronic issues.  Diagnoses and all orders for this visit:  Type 2 diabetes mellitus without complication, with long-term current use of insulin (HCC) -     Bayer DCA Hb A1c Waived  Hypothyroidism, unspecified type -     TSH + free T4  Essential hypertension -     CBC with Differential/Platelet -     CMP14+EGFR  Mixed hyperlipidemia -     Lipid panel  Need for immunization against influenza -     Flu Vaccine QUAD High Dose(Fluad)  Other orders -     benazepril (LOTENSIN) 20 MG tablet; Take 1 tablet (20 mg total) by mouth daily. -     Discontinue: insulin glargine (LANTUS SOLOSTAR) 100 UNIT/ML Solostar Pen; Inject 10 Units into the skin at bedtime. -     Insulin Pen Needle (PEN NEEDLES) 31G X 5 MM MISC; Use to give insulin daily Dx E11.9 -     glucose blood (ONETOUCH ULTRA) test strip; CHECK BLOOD SUGAR 2 TIMES A DAY Dx E11.9 -     isosorbide mononitrate (IMDUR) 60 MG 24 hr tablet; Take 1 tablet (60 mg total) by mouth daily. -     levothyroxine (SYNTHROID) 150 MCG tablet; Take 1 tablet (150 mcg total) by mouth daily before breakfast. -     simvastatin (ZOCOR) 10 MG tablet; Take 1 tablet (10 mg total) by mouth daily.  I have discontinued [name]. Brianna Weber's Lantus SoloStar and Lantus SoloStar. I have also changed her benazepril, isosorbide mononitrate, levothyroxine, and simvastatin. Additionally, I am having her maintain her aspirin, vitamin B-12, vitamin E, cholecalciferol, Pen Needles, and OneTouch Ultra.  Meds ordered this encounter  Medications   benazepril (LOTENSIN) 20 MG tablet    Sig: Take 1 tablet (20 mg total) by mouth daily.    Dispense:  90 tablet    Refill:  2   DISCONTD: insulin glargine (LANTUS SOLOSTAR) 100 UNIT/ML Solostar Pen    Sig: Inject 10 Units into the skin at bedtime.    Dispense:  15 mL    Refill:  11   Insulin Pen Needle (PEN NEEDLES) 31G X 5 MM MISC    Sig: Use to give insulin daily Dx E11.9    Dispense:  100 each     Refill:  3   glucose blood (ONETOUCH ULTRA) test strip    Sig: CHECK BLOOD SUGAR 2 TIMES A DAY Dx E11.9    Dispense:  200 strip    Refill:  3   isosorbide mononitrate (IMDUR) 60 MG 24 hr tablet    Sig: Take 1 tablet (60 mg total) by mouth daily.    Dispense:  90 tablet    Refill:  2   levothyroxine (SYNTHROID) 150 MCG tablet    Sig: Take 1 tablet (150 mcg total) by mouth daily before breakfast.    Dispense:  90 tablet    Refill:  2   simvastatin (ZOCOR) 10 MG tablet    Sig: Take 1 tablet (  10 mg total) by mouth daily.    Dispense:  90 tablet    Refill:  3     Follow-up: Return in about 3 months (around 01/03/2022).  [name], M.D.

## 2021-10-03 NOTE — Patient Instructions (Signed)
Check Blood pressure daily. The goal is to stay under 130/80.

## 2021-10-04 NOTE — Progress Notes (Signed)
Hello Fayne,  Your lab result is normal and/or stable.Some minor variations that are not significant are commonly marked abnormal, but do not represent any medical problem for you.  Best regards, [name], M.D.

## 2021-10-21 ENCOUNTER — Telehealth: Payer: Self-pay | Admitting: Family Medicine

## 2021-10-24 NOTE — Telephone Encounter (Signed)
It would be helpful if you would let patients know when their referral is declined, please.

## 2021-10-24 NOTE — Telephone Encounter (Signed)
Pt is not eligible for PCS services, she does not have Medicaid insurance

## 2021-10-24 NOTE — Telephone Encounter (Signed)
Daughter aware that pt not eligible for hh services. Pt has medicaid that cover medicare premium only. Daughter aware to call social worker to fix medicaid for medical services.

## 2021-10-24 NOTE — Telephone Encounter (Signed)
It is helpful to patients if you can inform them of the status of their referral. In this case that it was declined. Thanks, [employer]

## 2021-10-24 NOTE — Telephone Encounter (Signed)
Please let her know that the home health agencies in our area declined the referral.

## 2021-10-25 NOTE — Telephone Encounter (Signed)
Daughter aware to let us know if she gets approved for MCD coverage so that we can file out a PCS form on pt. She is needing more aide type help than skilled nursing or PT.

## 2021-11-01 ENCOUNTER — Telehealth: Payer: Self-pay | Admitting: Family Medicine

## 2021-11-02 ENCOUNTER — Other Ambulatory Visit: Payer: Self-pay | Admitting: Family Medicine

## 2021-11-02 DIAGNOSIS — G3184 Mild cognitive impairment, so stated: Secondary | ICD-10-CM

## 2021-11-02 NOTE — Telephone Encounter (Signed)
Does she want the neurology referral?

## 2021-11-02 NOTE — Telephone Encounter (Signed)
Referral placed, as requested WS 

## 2021-11-02 NOTE — Telephone Encounter (Signed)
I can't order PET for that, but I can refer to neurology for further evaluation

## 2021-11-09 ENCOUNTER — Ambulatory Visit
Admission: RE | Admit: 2021-11-09 | Discharge: 2021-11-09 | Disposition: A | Discharge status: Home / Self Care | Payer: Medicare Other | Source: Ambulatory Visit | Attending: Family Medicine | Admitting: Family Medicine

## 2021-11-09 ENCOUNTER — Other Ambulatory Visit: Payer: Self-pay | Admitting: Family Medicine

## 2021-11-09 DIAGNOSIS — Z1231 Encounter for screening mammogram for malignant neoplasm of breast: Secondary | ICD-10-CM | POA: Diagnosis not present

## 2021-11-21 ENCOUNTER — Telehealth: Payer: Self-pay | Admitting: Family Medicine

## 2021-11-22 ENCOUNTER — Telehealth: Payer: Self-pay | Admitting: Family Medicine

## 2021-11-22 NOTE — Telephone Encounter (Signed)
Patients daughter called to check status of Referral for pt to see a Neurologist. Says they have been trying to get patient a referral since December. Explained to daughter that it is still being worked on and that we are waiting for PCP to place new referral so that we can send to previous neurologist. Daughter voiced understanding and said that she would call back tomorrow afternoon to check status again.

## 2021-12-02 ENCOUNTER — Ambulatory Visit: Payer: Medicare Other | Admitting: Physician Assistant

## 2022-01-04 ENCOUNTER — Ambulatory Visit (INDEPENDENT_AMBULATORY_CARE_PROVIDER_SITE_OTHER): Payer: Medicare Other | Admitting: Family Medicine

## 2022-01-04 ENCOUNTER — Encounter: Payer: Self-pay | Admitting: Family Medicine

## 2022-01-04 VITALS — BP 167/79 | HR 74 | Temp 98.4°F | Ht 63.0 in | Wt 129.6 lb

## 2022-01-04 DIAGNOSIS — E039 Hypothyroidism, unspecified: Secondary | ICD-10-CM

## 2022-01-04 DIAGNOSIS — E119 Type 2 diabetes mellitus without complications: Secondary | ICD-10-CM | POA: Diagnosis not present

## 2022-01-04 DIAGNOSIS — E782 Mixed hyperlipidemia: Secondary | ICD-10-CM

## 2022-01-04 DIAGNOSIS — Z794 Long term (current) use of insulin: Secondary | ICD-10-CM

## 2022-01-04 DIAGNOSIS — I10 Essential (primary) hypertension: Secondary | ICD-10-CM

## 2022-01-04 LAB — LIPID PANEL

## 2022-01-04 LAB — BAYER DCA HB A1C WAIVED: HB A1C (BAYER DCA - WAIVED): 6.5 % — ABNORMAL HIGH (ref 4.8–5.6)

## 2022-01-04 NOTE — Progress Notes (Signed)
Subjective:  Patient ID: Brianna Weber, female    DOB: 1946/06/13  Age: 76 y.o. MRN: [id number]  CC: Medical Management of Chronic Issues   HPI MINAL STULLER presents forFollow-up of diabetes. Patient checks blood sugar at home.   Real good, but not too low, 80-90s fasting and not checking postprandial Patient denies symptoms such as polyuria, polydipsia, excessive hunger, nausea No significant hypoglycemic spells noted. Medications reviewed. Pt reports taking them regularly without complication/adverse reaction being reported today.    History Panagiota has a past medical history of Allergy, Arrhythmia, Breast cancer ([place]), Diabetes mellitus, Diverticulosis of colon, Hyperlipidemia, Hypertension, NSTEMI (non-ST elevated myocardial infarction) ([employer]), Peripheral edema, Thyroid disease, and Vitamin D deficiency.   She has a past surgical history that includes Mastectomy; Cholecystectomy; and Abdominal hysterectomy.   Her family history includes Cancer in her maternal aunt; Diabetes in her daughter; Heart attack in her father; Heart disease in her brother, brother, maternal aunt, maternal grandmother, maternal uncle, mother, sister, sister, and sister; Hyperlipidemia in her maternal aunt and maternal uncle; Hypertension in her maternal aunt, maternal uncle, mother, and another family member.She reports that she has never smoked. She has never used smokeless tobacco. She reports that she does not drink alcohol and does not use drugs.  Current Outpatient Medications on File Prior to Visit  Medication Sig Dispense Refill   aspirin 81 MG tablet Take 81 mg by mouth daily.     benazepril (LOTENSIN) 20 MG tablet Take 1 tablet (20 mg total) by mouth daily. 90 tablet 2   cholecalciferol (VITAMIN D3) 25 MCG (1000 UNIT) tablet Take 1,000 Units by mouth daily.     glucose blood (ONETOUCH ULTRA) test strip CHECK BLOOD SUGAR 2 TIMES A DAY Dx E11.9 200 strip 3   Insulin Pen Needle (PEN NEEDLES)  31G X 5 MM MISC Use to give insulin daily Dx E11.9 100 each 3   isosorbide mononitrate (IMDUR) 60 MG 24 hr tablet Take 1 tablet (60 mg total) by mouth daily. 90 tablet 2   levothyroxine (SYNTHROID) 150 MCG tablet Take 1 tablet (150 mcg total) by mouth daily before breakfast. 90 tablet 2   simvastatin (ZOCOR) 10 MG tablet Take 1 tablet (10 mg total) by mouth daily. 90 tablet 3   vitamin B-12 (CYANOCOBALAMIN) 500 MCG tablet Take 500 mcg by mouth daily.     vitamin E 200 UNIT capsule Take 200 Units by mouth daily.     No current facility-administered medications on file prior to visit.    ROS Review of Systems  Constitutional:  Negative for fever.  HENT:  Negative for congestion, rhinorrhea and sore throat.   Respiratory:  Negative for cough and shortness of breath.   Cardiovascular:  Negative for chest pain and palpitations.  Gastrointestinal:  Negative for abdominal pain.  Musculoskeletal:  Negative for arthralgias and myalgias.   Objective:  BP (!) 167/79    Pulse 74    Temp 98.4 F (36.9 C)    Ht $R'5\' 3"'nD$  (1.6 m)    Wt 129 lb 9.6 oz (58.8 kg)    SpO2 97%    BMI 22.96 kg/m   BP Readings from Last 3 Encounters:  01/04/22 (!) 167/79  10/03/21 (!) 150/66  09/05/21 139/62    Wt Readings from Last 3 Encounters:  01/04/22 129 lb 9.6 oz (58.8 kg)  10/03/21 128 lb 9.6 oz (58.3 kg)  09/05/21 120 lb (54.4 kg)     Physical Exam Constitutional:  General: She is not in acute distress.    Appearance: She is well-developed.  HENT:     Head: Normocephalic and atraumatic.  Eyes:     Conjunctiva/sclera: Conjunctivae normal.     Pupils: Pupils are equal, round, and reactive to light.  Neck:     Thyroid: No thyromegaly.  Cardiovascular:     Rate and Rhythm: Normal rate and regular rhythm.     Heart sounds: Normal heart sounds. No murmur heard. Pulmonary:     Effort: Pulmonary effort is normal. No respiratory distress.     Breath sounds: Normal breath sounds. No wheezing or rales.   Abdominal:     General: Bowel sounds are normal. There is no distension.     Palpations: Abdomen is soft.     Tenderness: There is no abdominal tenderness.  Musculoskeletal:        General: Normal range of motion.     Cervical back: Normal range of motion and neck supple.  Lymphadenopathy:     Cervical: No cervical adenopathy.  Skin:    General: Skin is warm and dry.  Neurological:     Mental Status: She is alert and oriented to person, place, and time.  Psychiatric:        Behavior: Behavior normal.        Thought Content: Thought content normal.        Judgment: Judgment normal.      Assessment & Plan:   Deshay was seen today for medical management of chronic issues.  Diagnoses and all orders for this visit:  Type 2 diabetes mellitus without complication, with long-term current use of insulin (HCC) -     Bayer DCA Hb A1c Waived  Hypothyroidism, unspecified type -     TSH + free T4  Essential hypertension -     CBC with Differential/Platelet -     CMP14+EGFR  Mixed hyperlipidemia -     Lipid panel      I am having [name]. [name] maintain her aspirin, vitamin B-12, vitamin E, cholecalciferol, benazepril, Pen Needles, OneTouch Ultra, isosorbide mononitrate, levothyroxine, and simvastatin.  No orders of the defined types were placed in this encounter.    Follow-up: Return in about 3 months (around 04/03/2022).  [name], M.D.

## 2022-01-05 LAB — LIPID PANEL
Chol/HDL Ratio: 3.1 ratio (ref 0.0–4.4)
Cholesterol, Total: 189 mg/dL (ref 100–199)
HDL: 61 mg/dL (ref >39)
LDL Chol Calc (NIH): 105 mg/dL — ABNORMAL HIGH (ref 0–99)
Triglycerides: 134 mg/dL (ref 0–149)
VLDL Cholesterol Cal: 23 mg/dL (ref 5–40)

## 2022-01-05 LAB — CMP14+EGFR
ALT: 12 IU/L (ref 0–32)
AST: 18 IU/L (ref 0–40)
Albumin/Globulin Ratio: 1.6 (ref 1.2–2.2)
Albumin: 4.2 g/dL (ref 3.7–4.7)
Alkaline Phosphatase: 127 IU/L — ABNORMAL HIGH (ref 44–121)
BUN/Creatinine Ratio: 14 (ref 12–28)
BUN: 19 mg/dL (ref 8–27)
Bilirubin Total: 0.6 mg/dL (ref 0.0–1.2)
CO2: 22 mmol/L (ref 20–29)
Calcium: 9.9 mg/dL (ref 8.7–10.3)
Chloride: 103 mmol/L (ref 96–106)
Creatinine, Ser: 1.34 mg/dL — ABNORMAL HIGH (ref 0.57–1.00)
Globulin, Total: 2.6 g/dL (ref 1.5–4.5)
Glucose: 229 mg/dL — ABNORMAL HIGH (ref 70–99)
Potassium: 4.1 mmol/L (ref 3.5–5.2)
Sodium: 141 mmol/L (ref 134–144)
Total Protein: 6.8 g/dL (ref 6.0–8.5)
eGFR: 42 mL/min/{1.73_m2} — ABNORMAL LOW (ref >59)

## 2022-01-05 LAB — CBC WITH DIFFERENTIAL/PLATELET
Basophils Absolute: 0.1 10*3/uL (ref 0.0–0.2)
Basos: 1 %
EOS (ABSOLUTE): 0.3 10*3/uL (ref 0.0–0.4)
Eos: 5 %
Hematocrit: 39.9 % (ref 34.0–46.6)
Hemoglobin: 13 g/dL (ref 11.1–15.9)
Immature Grans (Abs): 0 10*3/uL (ref 0.0–0.1)
Immature Granulocytes: 0 %
Lymphocytes Absolute: 1.4 10*3/uL (ref 0.7–3.1)
Lymphs: 20 %
MCH: 28.7 pg (ref 26.6–33.0)
MCHC: 32.6 g/dL (ref 31.5–35.7)
MCV: 88 fL (ref 79–97)
Monocytes Absolute: 0.4 10*3/uL (ref 0.1–0.9)
Monocytes: 6 %
Neutrophils Absolute: 4.6 10*3/uL (ref 1.4–7.0)
Neutrophils: 68 %
Platelets: 285 10*3/uL (ref 150–450)
RBC: 4.53 x10E6/uL (ref 3.77–5.28)
RDW: 12.9 % (ref 11.7–15.4)
WBC: 6.7 10*3/uL (ref 3.4–10.8)

## 2022-01-05 LAB — TSH+FREE T4
Free T4: 1.36 ng/dL (ref 0.82–1.77)
TSH: 9.3 u[IU]/mL — ABNORMAL HIGH (ref 0.450–4.500)

## 2022-01-09 ENCOUNTER — Other Ambulatory Visit: Payer: Self-pay | Admitting: Family Medicine

## 2022-01-09 MED ORDER — LEVOTHYROXINE SODIUM 175 MCG PO TABS: 175.0000 ug | ORAL_TABLET | Freq: Every day | ORAL | 1 refills | Status: DC

## 2022-01-10 ENCOUNTER — Other Ambulatory Visit: Payer: Self-pay | Admitting: *Deleted

## 2022-01-10 DIAGNOSIS — E039 Hypothyroidism, unspecified: Secondary | ICD-10-CM

## 2022-01-10 MED ORDER — LEVOTHYROXINE SODIUM 150 MCG PO TABS: 150.0000 ug | ORAL_TABLET | Freq: Every day | ORAL | 0 refills | Status: DC

## 2022-03-07 ENCOUNTER — Telehealth: Payer: Self-pay | Admitting: Physician Assistant

## 2022-03-07 NOTE — Telephone Encounter (Signed)
Patient daughter notified, will discuss on follow up  ?

## 2022-03-07 NOTE — Telephone Encounter (Signed)
Patient's daughter [name] called and rescheduled the patient's referral from January that was cancelled to 03/09/22. ? ?She wanted Sharene Butters, PA to know ahead of time that the patient's memory has declined significantly. ? ?She also said the patient is not comfortable with her daughter talking in front of her about her, she'd like some time to speak separately, if possible. ?

## 2022-03-08 ENCOUNTER — Ambulatory Visit (INDEPENDENT_AMBULATORY_CARE_PROVIDER_SITE_OTHER): Payer: Medicare Other | Admitting: Family Medicine

## 2022-03-08 ENCOUNTER — Encounter: Payer: Self-pay | Admitting: Family Medicine

## 2022-03-08 VITALS — BP 182/84 | HR 61 | Temp 98.3°F | Ht 63.0 in | Wt 131.4 lb

## 2022-03-08 DIAGNOSIS — E039 Hypothyroidism, unspecified: Secondary | ICD-10-CM

## 2022-03-08 DIAGNOSIS — E119 Type 2 diabetes mellitus without complications: Secondary | ICD-10-CM | POA: Diagnosis not present

## 2022-03-08 DIAGNOSIS — I10 Essential (primary) hypertension: Secondary | ICD-10-CM | POA: Diagnosis not present

## 2022-03-08 DIAGNOSIS — Z794 Long term (current) use of insulin: Secondary | ICD-10-CM | POA: Diagnosis not present

## 2022-03-08 MED ORDER — BENAZEPRIL HCL 40 MG PO TABS: 40.0000 mg | ORAL_TABLET | Freq: Every day | ORAL | 3 refills | Status: AC

## 2022-03-08 NOTE — Progress Notes (Signed)
? ?Subjective:  ?Patient ID: Brianna Weber, female    DOB: August 14, 1946  Age: 76 y.o. MRN: [id number] ? ?CC: Medical Management of Chronic Issues ? ? ?HPI ?Brianna Weber presents for  presents for  follow-up of hypertension. Patient has no history of headache chest pain or shortness of breath or recent cough. Patient also denies symptoms of TIA such as focal numbness or weakness. Patient denies side effects from medication. States taking it regularly. ? ? ? follow-up on  thyroid. The patient has a history of hypothyroidism for many years.Dose had to be adjusted recently. . Pt. denies any change in  voice, loss of hair, She is always cold - by nature. Energy level has been adequate to good. Patient denies constipation and diarrhea. No myxedema. Medication is as noted below. Verified that pt is taking it daily on an empty stomach. Well tolerated. ? ? ? ?  03/08/2022  ? 10:04 AM 01/04/2022  ? 10:08 AM 10/03/2021  ?  8:53 AM  ?Depression screen PHQ 2/9  ?Decreased Interest 0 0 3  ?Down, Depressed, Hopeless 0 0 3  ?PHQ - 2 Score 0 0 6  ?Altered sleeping   3  ?Tired, decreased energy   3  ?Change in appetite   3  ?Feeling bad or failure about yourself    3  ?Trouble concentrating   3  ?Moving slowly or fidgety/restless   0  ?Suicidal thoughts   0  ?PHQ-9 Score   21  ?Difficult doing work/chores   Not difficult at all  ? ? ?History ?Brianna Weber has a past medical history of Allergy, Arrhythmia, Breast cancer ([place]), Diabetes mellitus, Diverticulosis of colon, Hyperlipidemia, Hypertension, NSTEMI (non-ST elevated myocardial infarction) ([place]), Peripheral edema, Thyroid disease, and Vitamin D deficiency.  ? ?She has a past surgical history that includes Mastectomy; Cholecystectomy; and Abdominal hysterectomy.  ? ?Her family history includes Cancer in her maternal aunt; Diabetes in her daughter; Heart attack in her father; Heart disease in her brother, brother, maternal aunt, maternal grandmother, maternal uncle, mother,  sister, sister, and sister; Hyperlipidemia in her maternal aunt and maternal uncle; Hypertension in her maternal aunt, maternal uncle, mother, and another family member.She reports that she has never smoked. She has never used smokeless tobacco. She reports that she does not drink alcohol and does not use drugs. ? ? ? ?ROS ?Review of Systems  ?Constitutional: Negative.  Negative for fever.  ?HENT: Negative.  Negative for congestion, rhinorrhea and sore throat.   ?Eyes:  Negative for visual disturbance.  ?Respiratory:  Negative for cough and shortness of breath.   ?Cardiovascular:  Negative for chest pain and palpitations.  ?Gastrointestinal:  Negative for abdominal pain.  ?Musculoskeletal:  Negative for arthralgias and myalgias.  ? ?Objective:  ?BP (!) 182/84   Pulse 61   Temp 98.3 ?F (36.8 ?C)   Ht '5\' 3"'$  (1.6 m)   Wt 131 lb 6.4 oz (59.6 kg)   SpO2 96%   BMI 23.28 kg/m?  ? ?BP Readings from Last 3 Encounters:  ?03/09/22 (!) 177/96  ?03/08/22 (!) 182/84  ?01/04/22 (!) 167/79  ? ? ?Wt Readings from Last 3 Encounters:  ?03/09/22 131 lb (59.4 kg)  ?03/08/22 131 lb 6.4 oz (59.6 kg)  ?01/04/22 129 lb 9.6 oz (58.8 kg)  ? ? ? ?Physical Exam ?Constitutional:   ?   General: She is not in acute distress. ?   Appearance: She is well-developed.  ?Cardiovascular:  ?   Rate and Rhythm: Normal rate and  regular rhythm.  ?Pulmonary:  ?   Breath sounds: Normal breath sounds.  ?Musculoskeletal:     ?   General: Normal range of motion.  ?Skin: ?   General: Skin is warm and dry.  ?Neurological:  ?   Mental Status: She is alert and oriented to person, place, and time.  ? ? ? ? ?Assessment & Plan:  ? ?Brianna Weber was seen today for medical management of chronic issues. ? ?Diagnoses and all orders for this visit: ? ?Hypothyroidism, unspecified type ?-     TSH + free T4 ? ?Essential hypertension ? ?Type 2 diabetes mellitus without complication, with long-term current use of insulin ([place]) ? ?Other orders ?-     benazepril (LOTENSIN) 40 MG  tablet; Take 1 tablet (40 mg total) by mouth daily. ? ? ? ? ? ? ?I have changed Brianna Weber. Brianna Weber's benazepril. I am also having her maintain her aspirin, vitamin B-12, vitamin E, cholecalciferol, Pen Needles, OneTouch Ultra, isosorbide mononitrate, simvastatin, and levothyroxine. ? ?Allergies as of 03/08/2022   ?No Known Allergies ?  ? ?  ?Medication List  ?  ? ?  ? Accurate as of March 08, 2022 11:59 PM. If you have any questions, ask your nurse or doctor.  ?  ?  ? ?  ? ?aspirin 81 MG tablet ?Take 81 mg by mouth daily. ?  ?benazepril 40 MG tablet ?Commonly known as: LOTENSIN ?Take 1 tablet (40 mg total) by mouth daily. ?What changed:  ?medication strength ?how much to take ?Changed by: [name], MD ?  ?cholecalciferol 25 MCG (1000 UNIT) tablet ?Commonly known as: VITAMIN D3 ?Take 1,000 Units by mouth daily. ?  ?isosorbide mononitrate 60 MG 24 hr tablet ?Commonly known as: IMDUR ?Take 1 tablet (60 mg total) by mouth daily. ?  ?levothyroxine 150 MCG tablet ?Commonly known as: SYNTHROID ?Take 1 tablet (150 mcg total) by mouth daily. ?  ?OneTouch Ultra test strip ?Generic drug: glucose blood ?CHECK BLOOD SUGAR 2 TIMES A DAY Dx E11.9 ?  ?Pen Needles 31G X 5 MM Misc ?Use to give insulin daily Dx E11.9 ?  ?simvastatin 10 MG tablet ?Commonly known as: ZOCOR ?Take 1 tablet (10 mg total) by mouth daily. ?  ?vitamin B-12 500 MCG tablet ?Commonly known as: CYANOCOBALAMIN ?Take 500 mcg by mouth daily. ?  ?vitamin E 200 UNIT capsule ?Take 200 Units by mouth daily. ?  ? ?  ? ? ? ?Follow-up: Return in about 6 months (around 09/07/2022). ? ?[name], M.D. ?

## 2022-03-09 ENCOUNTER — Other Ambulatory Visit (INDEPENDENT_AMBULATORY_CARE_PROVIDER_SITE_OTHER): Payer: Medicare Other

## 2022-03-09 ENCOUNTER — Ambulatory Visit: Payer: Medicare Other | Admitting: Physician Assistant

## 2022-03-09 ENCOUNTER — Other Ambulatory Visit: Payer: Self-pay

## 2022-03-09 ENCOUNTER — Encounter: Payer: Self-pay | Admitting: Physician Assistant

## 2022-03-09 VITALS — BP 177/96 | HR 74 | Resp 18 | Ht 62.0 in | Wt 131.0 lb

## 2022-03-09 DIAGNOSIS — R413 Other amnesia: Secondary | ICD-10-CM

## 2022-03-09 DIAGNOSIS — F0150 Vascular dementia without behavioral disturbance: Secondary | ICD-10-CM

## 2022-03-09 DIAGNOSIS — F03918 Unspecified dementia, unspecified severity, with other behavioral disturbance: Secondary | ICD-10-CM | POA: Diagnosis not present

## 2022-03-09 DIAGNOSIS — G309 Alzheimer's disease, unspecified: Secondary | ICD-10-CM

## 2022-03-09 DIAGNOSIS — E02 Subclinical iodine-deficiency hypothyroidism: Secondary | ICD-10-CM | POA: Diagnosis not present

## 2022-03-09 DIAGNOSIS — F0280 Dementia in other diseases classified elsewhere without behavioral disturbance: Secondary | ICD-10-CM

## 2022-03-09 LAB — VITAMIN B12: Vitamin B-12: 976 pg/mL — ABNORMAL HIGH (ref 211–911)

## 2022-03-09 LAB — TSH+FREE T4
Free T4: 0.21 ng/dL — ABNORMAL LOW (ref 0.82–1.77)
TSH: 222 u[IU]/mL — ABNORMAL HIGH (ref 0.450–4.500)

## 2022-03-09 LAB — TSH: TSH: 184.74 u[IU]/mL — ABNORMAL HIGH (ref 0.35–5.50)

## 2022-03-09 MED ORDER — DONEPEZIL HCL 10 MG PO TABS: ORAL_TABLET | ORAL | 11 refills | Status: DC

## 2022-03-09 NOTE — Progress Notes (Signed)
? ? ? ? ?Assessment/Plan:  ? ?Brianna Weber is a very pleasant 76 y.o. year old RH female with  a history of hypertension, hyperlipidemia, anxiety, hypothyroidism, history of breast cancer status postmastectomy, history of stroke in 2004, seen today for evaluation of memory loss.  She carries a diagnosis of mild cognitive impairment dating back to 11/15/2018, when she was first seen in our office.  However, failed to follow up since July 2020 in the midst of COVID pandemic.  MoCA today is 14/30, with delayed recall 0/5, deficiencies in language, attention, memory, naming and visual-spatial executive.  (MoCA blind in 2020 was 11/20).  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 ? ? ? Recommendations:  ? ?Dementia due to Alzheimer's Disease ? ?MRI brain with/without contrast to assess for underlying structural abnormality and assess vascular load  ?Check B12, repeat TSH+ T4 due to very abnormal values, rule out artifact. ?Neurocognitive testing for clarity of diagnosis and trajectory  ?Start donepezil 10 mg, take 5 mg p.o. daily for 2 weeks, then increase to 10 mg p.o. twice daily if tolerated. ?Discussed safety both in and out of the home.  ?Discussed the importance of regular daily schedule to maintain brain function.  ?Continue to monitor mood with PCP.  ?Attend [place]  ?Stay active at least 30 minutes at least 3 times a week.  ?Naps should be scheduled and should be no longer than 60 minutes and should not occur after 2 PM.  ?Control cardiovascular risk factors  ?Mediterranean diet is recommended  ?Folllow up in 1 month  ? ?Subjective:  ? ? ?The patient is seen in neurologic consultation at the request of Brianna Fraise, MD for the evaluation of memory.  The patient is accompanied by  who supplements the history. ?This is a 76 y.o. year old RH  female who has had memory issues for about   ? ? ?How long did patient have memory difficulties?  6years , "I don't  see people often so my memory is never elevated".  Patient does not recall being seen at our office in January 2020.  At that time, she did repeat herself talking about how she "read, crochet, pay my bills by myself ", (a sentence that she is being repeating throughout the visit today).  Her daughter is concerned because the patient refuses to have any stimulation "from the outside world, and has been alienating herself ". ?Patient lives with:  Patient lives alone for 4 years ?repeats oneself?  "Maybe".  Daughter reports that she is repeating the same sentences more often than before (see above) ?Disoriented when walking into a room?  Patient denies   ?Leaving objects in unusual places?  Patient denies "she lines up things around her so I know where they are " ?Ambulates  with difficulty?   Patient denies   ?Recent falls?  In the kitchen, she suffered a mechanical fall as she tripped with the carpet, without loss of consciousness or any head injury  ?Any head injuries?  Patient denies   ?History of seizures?   Patient denies   ?Wandering behavior?  Patient denies . "I am afraid of the bears outside of my house so I stay inside" ?Patient drives?   Patient no longer drives " the car broke down and made even by another 1" ?Any mood changes such irritability agitation?  Patient denies   ?Any history of depression?:  Sometimes.   ?Hallucinations?  Patient denies.  Daughter reports  she does.  She had an episode in which she called the sheriff because "the woman to call with my credit card ".  When the sheriff came to the house, she had her credit card in  her hands. ?Paranoia?  Patient denies  "I got great neighbors" "sometimes my children get on my nerves, because they are to get me ". ?Patient reports that he sleeps well without vivid dreams, REM behavior or sleepwalking    ?History of sleep apnea?  Patient denies   ?Any hygiene concerns?  Patient denies "   I take bird bath, take a shower twice a week .  " ?Independent  of bathing and dressing?  Endorsed  ?Does the patient needs help with medications?  She denies, "Mainly vitamins, container with the days on it ".  It is unclear if she forgets to take any medications ?Who is in charge of the finances?  Patient is in charge, autopay, denies missing any bills   ?Any changes in appetite?  Patient denies   ?Patient have trouble swallowing? Patient denies   ?Does the patient cook?  Patient denies.  Has meals on wheels  ?Any kitchen accidents such as leaving the stove on? Patient denies   ?Any headaches?  Patient denies   ?The double vision? Patient denies   ?Any focal numbness or tingling?  Patient denies   ?Chronic back pain Patient denies   ?Unilateral weakness?  Patient denies   ?Any tremors?  Patient denies   ?Any history of anosmia?  Patient denies   ?Any incontinence of urine?  Patient denies, wears pads  ?Any bowel dysfunction?   Patient denies  ?History of heavy alcohol intake?  Patient denies   ?History of heavy tobacco use?  Patient denies   ?Family history of dementia?   Father had Alzheimer's Disease   ?Retired IT consultant.  Ninth grade education. ? ? ?Labs TSH: 222! T4 .0.21 ? ? ?History on Initial Assessment 11/15/2018: This is a pleasant 76 year old right-handed woman with a history of hypertension, diabetes, breast cancer s/p mastectomy, presenting for evaluation of memory loss. Memory concerns were raised when she was admitted for altered mental status last 08/26/18. Records from her hospitalization were reviewed, she recalled speaking to family on the phone then waking up to EMS and family around her. Glucose level was 51. She was treated for hypoglycemia and noted to be alert, oriented and conversant the next day. She was unable to tell physicians what times of the day she takes her insulin or what dose, then it is noted that she revealed she had been adjusting her insulin dose at home on her own without notifying her PCP. Because of her poor memory, she was  not able to tell MD how many units she was administering, but she reported that the low CBG readings caused her to reduce her insulin dosing. Dosing was changed in the hospital and patient was referred for Neurological evaluation of memory loss.  ?  ?She and her daughter [name] both report today that she was taking her insulin as prescribed and she was not missing any doses or changing it, they state "the doctor had her on too much insulin and the dose has been changed." She and [name] report she is very organized with how she takes her medications and writes things down. [name] did report that she would not snack before bedtime, which made her sugar levels go down, but she is better with meals now. She feels her  memory is good. [name] started noticing minor memory issues for the past 10 years, worse the past 2 years. Her husband passed away in 08/21/2018, then she lost her 34 year old cat, then had to move in with her sister 3 months ago. She recognizes that this was a lot, "I know it's depression." She denies missing bill payments or leaving the stove on. Family was uncomfortable with her driving after the incident in October, she stopped driving when she moved in with her sister, denied getting lost previously, but states that if she drove now she may forget that she lives with her sister and drive to their old home. [name] states she repeats herself several times. [name] also notes increased moodiness and irritability, no paranoia or hallucinations. [name] reports she was taking Benadryl BID and was sleeping a lot, this has improved with discontinuation of Benadryl. Sleep now is pretty good, no wandering behavior.She is independent with dressing and bathing. ?  ?She denies any headaches, dizziness, diplopia, dysarthria/dysphagia, neck/back pain, focal numbness/tingling/weakness, bowel dysfunction, anosmia or tremors. She denies any urinary incontinence but wears pads. She denies any family history of  dementia, no alcohol use. She recalls a bowl hitting her head at work several years ago, no significant head injuries.  ?  ?I personally reviewed MRI brain with and without contrast done 09/2018 which did not

## 2022-03-09 NOTE — Patient Instructions (Addendum)
It was a pleasure to see you today at our office.  ? ?Recommendations: ? ?Neurocognitive evaluation at our office ?MRI of the brain, the radiology office will call you to arrange you appointment ?Check labs today ?We will start donepezil half tablet ('5mg'$ ) daily for 2  weeks.  If you are tolerating the medication, then after 2 weeks, we will increase the dose to a full tablet of 10 mg daily. ?Follow up in 1 month to discuss MRI results  ? ?RECOMMENDATIONS FOR ALL PATIENTS WITH MEMORY PROBLEMS: ?1. Continue to exercise (Recommend 30 minutes of walking everyday, or 3 hours every week) ?2. Increase social interactions - continue going to [place] and enjoy social gatherings with friends and family ?3. Eat healthy, avoid fried foods and eat more fruits and vegetables ?4. Maintain adequate blood pressure, blood sugar, and blood cholesterol level. Reducing the risk of stroke and cardiovascular disease also helps promoting better memory. ?5. Avoid stressful situations. Live a simple life and avoid aggravations. Organize your time and prepare for the next day in anticipation. ?6. Sleep well, avoid any interruptions of sleep and avoid any distractions in the bedroom that may interfere with adequate sleep quality ?7. Avoid sugar, avoid sweets as there is a strong link between excessive sugar intake, diabetes, and cognitive impairment ?We discussed the Mediterranean diet, which has been shown to help patients reduce the risk of progressive memory disorders and reduces cardiovascular risk. This includes eating fish, eat fruits and green leafy vegetables, nuts like almonds and hazelnuts, walnuts, and also use olive oil. Avoid fast foods and fried foods as much as possible. Avoid sweets and sugar as sugar use has been linked to worsening of memory function. ? ?There is always a concern of gradual progression of memory problems. If this is the case, then we may need to adjust level of care according to patient needs. Support, both to  the patient and caregiver, should then be put into place.  ? ? ? ? ?You have been referred for a neuropsychological evaluation (i.e., evaluation of memory and thinking abilities). Please bring someone with you to this appointment if possible, as it is helpful for the doctor to hear from both you and another adult who knows you well. Please bring eyeglasses and hearing aids if you wear them.  ?  ?The evaluation will take approximately 3 hours and has two parts: ?  ?The first part is a clinical interview with the neuropsychologist (Dr. Melvyn Novas or Dr. Nicole Kindred). During the interview, the neuropsychologist will speak with you and the individual you brought to the appointment.  ?  ?The second part of the evaluation is testing with the doctor's technician Hinton Dyer or Maudie Mercury). During the testing, the technician will ask you to remember different types of material, solve problems, and answer some questionnaires. Your family member will not be present for this portion of the evaluation. ?  ?Please note: We must reserve several hours of the neuropsychologist's time and the psychometrician's time for your evaluation appointment. As such, there is a No-Show fee of $100. If you are unable to attend any of your appointments, please contact our office as soon as possible to reschedule.  ? ? ?FALL PRECAUTIONS: Be cautious when walking. Scan the area for obstacles that may increase the risk of trips and falls. When getting up in the mornings, sit up at the edge of the bed for a few minutes before getting out of bed. Consider elevating the bed at the head end to avoid drop  of blood pressure when getting up. Walk always in a well-lit room (use night lights in the walls). Avoid area rugs or power cords from appliances in the middle of the walkways. Use a walker or a cane if necessary and consider physical therapy for balance exercise. Get your eyesight checked regularly. ? ?FINANCIAL OVERSIGHT: Supervision, especially oversight when making  financial decisions or transactions is also recommended. ? ?HOME SAFETY: Consider the safety of the kitchen when operating appliances like stoves, microwave oven, and blender. Consider having supervision and share cooking responsibilities until no longer able to participate in those. Accidents with firearms and other hazards in the house should be identified and addressed as well. ? ? ?ABILITY TO BE LEFT ALONE: If patient is unable to contact 911 operator, consider using [facility], or when the need is there, arrange for someone to stay with patients. Smoking is a fire hazard, consider supervision or cessation. Risk of wandering should be assessed by caregiver and if detected at any point, supervision and safe proof recommendations should be instituted. ? ?MEDICATION SUPERVISION: Inability to self-administer medication needs to be constantly addressed. Implement a mechanism to ensure safe administration of the medications. ? ? ?DRIVING: Regarding driving, in patients with progressive memory problems, driving will be impaired. We advise to have someone else do the driving if trouble finding directions or if minor accidents are reported. Independent driving assessment is available to determine safety of driving. ? ? ?If you are interested in the driving assessment, you can contact the following: ? ?[facility] [employer] [facility] in [place] ? ?[place] [phone] ? ?[facility] [phone] ? ?[facility] [phone] or [phone] ? ? ? ?Mediterranean Diet ?A Mediterranean diet refers to food and lifestyle choices that are based on the traditions of countries located on the [employer]. This way of eating has been shown to help prevent certain conditions and improve outcomes for people who have chronic diseases, like kidney disease and heart disease. ?What are tips for following this plan? ?Lifestyle  ?Cook and eat meals together with your family, when  possible. ?Drink enough fluid to keep your urine clear or pale yellow. ?Be physically active every day. This includes: ?Aerobic exercise like running or swimming. ?Leisure activities like gardening, walking, or housework. ?Get 7-8 hours of sleep each night. ?If recommended by your health care provider, drink red wine in moderation. This means 1 glass a day for nonpregnant women and 2 glasses a day for men. A glass of wine equals 5 oz (150 mL). ?Reading food labels  ?Check the serving size of packaged foods. For foods such as rice and pasta, the serving size refers to the amount of cooked product, not dry. ?Check the total fat in packaged foods. Avoid foods that have saturated fat or trans fats. ?Check the ingredients list for added sugars, such as corn syrup. ?Shopping  ?At the grocery store, buy most of your food from the areas near the walls of the store. This includes: ?Fresh fruits and vegetables (produce). ?Grains, beans, nuts, and seeds. Some of these may be available in unpackaged forms or large amounts (in bulk). ?Fresh seafood. ?Poultry and eggs. ?Low-fat dairy products. ?Buy whole ingredients instead of prepackaged foods. ?Buy fresh fruits and vegetables in-season from local farmers markets. ?Buy frozen fruits and vegetables in resealable bags. ?If you do not have access to quality fresh seafood, buy precooked frozen shrimp or canned fish, such as tuna, salmon, or sardines. ?Buy small amounts of raw or cooked vegetables,  salads, or olives from the deli or salad bar at your store. ?Stock your pantry so you always have certain foods on hand, such as olive oil, canned tuna, canned tomatoes, rice, pasta, and beans. ?Cooking  ?Cook foods with extra-virgin olive oil instead of using butter or other vegetable oils. ?Have meat as a side dish, and have vegetables or grains as your main dish. This means having meat in small portions or adding small amounts of meat to foods like pasta or stew. ?Use beans or  vegetables instead of meat in common dishes like chili or lasagna. ?Experiment with different cooking methods. Try roasting or broiling vegetables instead of steaming or saut?eing them. ?Add frozen vegetables to soups, stews

## 2022-03-10 LAB — T4: T4, Total: 4.3 ug/dL — ABNORMAL LOW (ref 5.1–11.9)

## 2022-03-10 NOTE — Progress Notes (Signed)
Please let daughter know that indeed the thyroid levels are way abnormal, make sure she follows up with primary doctor. I already contacted Dr. Livia Snellen, thanks

## 2022-03-30 ENCOUNTER — Ambulatory Visit
Admission: RE | Admit: 2022-03-30 | Discharge: 2022-03-30 | Disposition: A | Discharge status: Home / Self Care | Payer: Medicare Other | Source: Ambulatory Visit | Attending: Physician Assistant | Admitting: Physician Assistant

## 2022-03-30 DIAGNOSIS — Z853 Personal history of malignant neoplasm of breast: Secondary | ICD-10-CM | POA: Diagnosis not present

## 2022-03-30 DIAGNOSIS — R413 Other amnesia: Secondary | ICD-10-CM | POA: Diagnosis not present

## 2022-03-30 DIAGNOSIS — G319 Degenerative disease of nervous system, unspecified: Secondary | ICD-10-CM | POA: Diagnosis not present

## 2022-03-31 ENCOUNTER — Telehealth: Payer: Self-pay | Admitting: Physician Assistant

## 2022-03-31 NOTE — Telephone Encounter (Signed)
Patients daughter sharon would like a call from [facility] regarding the MRI results

## 2022-04-04 ENCOUNTER — Ambulatory Visit: Payer: Medicare Other | Admitting: Family Medicine

## 2022-04-04 NOTE — Telephone Encounter (Signed)
Patients daughter called again and wanted to get results asap.

## 2022-04-05 NOTE — Telephone Encounter (Signed)
I advised of MRI results, will follow up on visit. Thanked me for calling.

## 2022-04-05 NOTE — Telephone Encounter (Signed)
I tried to contact daughter, no voicemail, will call again later

## 2022-04-05 NOTE — Telephone Encounter (Signed)
Daughter called back  

## 2022-04-11 ENCOUNTER — Ambulatory Visit: Payer: Medicare Other | Admitting: Physician Assistant

## 2022-04-11 ENCOUNTER — Encounter: Payer: Self-pay | Admitting: Physician Assistant

## 2022-04-11 VITALS — BP 130/72 | HR 77 | Resp 18 | Ht 64.0 in | Wt 131.0 lb

## 2022-04-11 DIAGNOSIS — F0150 Vascular dementia without behavioral disturbance: Secondary | ICD-10-CM

## 2022-04-11 DIAGNOSIS — F0280 Dementia in other diseases classified elsewhere without behavioral disturbance: Secondary | ICD-10-CM

## 2022-04-11 DIAGNOSIS — G309 Alzheimer's disease, unspecified: Secondary | ICD-10-CM

## 2022-04-11 NOTE — Progress Notes (Signed)
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  Most recent MoCA is 14/30.  Brain MRI  on 03/30/2022 showed progression of atrophy and chronic microvascular ischemic changes since 2019.  Patient lives alone, which may be of concern for safety.    Continue Donepezil 10 mg daily  Side effects were discussed Recommend home health nursing referral Keep the appointment with neurocognitive testing in 2024 Continue to monitor mood by PCP. Continue to monitor thyroid levels by PCP. Follow up in 6  months.   Case discussed with Dr. Delice Lesch who agrees with the plan     Subjective:    Brianna Weber is a very pleasant 76 y.o. RH female  seen today in follow up for memory loss. This patient is accompanied in the office by her who supplements the history.  Previous records as well as any outside records available were reviewed prior to todays visit.  Patient was last seen at our office on 03/05/2022 at which time her MoCA was 14/30.  MRI of the brain on 03/30/2022 showed progression of atrophy and chronic microvascular ischemic changes since 2019. Patient is currently on donepezil 10 mg daily  Any changes in memory since last visit? Denies. He has been doing crossword puzzles since her last visit.  Patient lives with:  Patient lives alone, does not like to go out, she does not like visitors or walking out of the periphery of her home.  repeats oneself? Endorsed "I read, crochet, pay bills by myself" (repeats this sentence throughout the visit) Disoriented when walking into a room?  Patient denies   Leaving objects in unusual places?  Patient denies . Daughter lines up things around her so she knows where they are Ambulates  with  difficulty?   Patient denies   Recent falls?  Patient denies, but daughter reports that she is weaker than prior, not seen during this visit.   Any head injuries?  Patient denies   History of seizures?   Patient denies   Wandering behavior?  Patient denies. She is afraid to go outside because of the bears around her property   Patient drives?   Patient no longer drives    Any mood changes such irritability agitation?  Patient denies   Any depression?:  "sometimes" Hallucinations?  Patient denies   Paranoia?  She has known paranoia, afraid that someone would take her stuff. She does not like to walk outside the house.  Patient reports that he sleeps well without vivid dreams, REM behavior or sleepwalking   Patient reports vivid dreams   History of sleep apnea?  Patient denies   Any hygiene concerns? "I take bird baths, take a shower twice a week" Independent of bathing and dressing?  Endorsed  Does the patient needs help with medications?   Patient denies. She has been taking her thyroid medicine, her PCP is monitoring the values.   Who is in charge of the finances?  Patient is in charge. She has the bills on autopay Any changes in appetite?  Patient denies   Patient have trouble swallowing? Patient denies   Does the patient cook?  Patient denies   Any kitchen accidents such as leaving the stove on? Patient denies   Any headaches?  Patient denies   The double  vision? Patient denies   Any focal numbness or tingling?  Patient denies   Chronic back pain Patient denies   Unilateral weakness?  Patient denies   Any tremors?  Patient denies   Any history of anosmia?  Patient denies   Any incontinence of urine? Stress incontinence, wears pads. Any bowel dysfunction?   Patient denies      History on Initial Assessment 11/15/2018: This is a pleasant 76 year old right-handed woman with a history of hypertension, diabetes, breast cancer s/p mastectomy, presenting for evaluation of memory loss. Memory  concerns were raised when she was admitted for altered mental status last 08/26/18. Records from her hospitalization were reviewed, she recalled speaking to family on the phone then waking up to EMS and family around her. Glucose level was 51. She was treated for hypoglycemia and noted to be alert, oriented and conversant the next day. She was unable to tell physicians what times of the day she takes her insulin or what dose, then it is noted that she revealed she had been adjusting her insulin dose at home on her own without notifying her PCP. Because of her poor memory, she was not able to tell MD how many units she was administering, but she reported that the low CBG readings caused her to reduce her insulin dosing. Dosing was changed in the hospital and patient was referred for Neurological evaluation of memory loss.    She and her daughter [name] both report today that she was taking her insulin as prescribed and she was not missing any doses or changing it, they state "the doctor had her on too much insulin and the dose has been changed." She and [name] report she is very organized with how she takes her medications and writes things down. [name] did report that she would not snack before bedtime, which made her sugar levels go down, but she is better with meals now. She feels her memory is good. [name] started noticing minor memory issues for the past 10 years, worse the past 2 years. Her husband passed away in 08/09/2018, then she lost her 41 year old cat, then had to move in with her sister 3 months ago. She recognizes that this was a lot, "I know it's depression." She denies missing bill payments or leaving the stove on. Family was uncomfortable with her driving after the incident in October, she stopped driving when she moved in with her sister, denied getting lost previously, but states that if she drove now she may forget that she lives with her sister and drive to their old home. [name] states  she repeats herself several times. [name] also notes increased moodiness and irritability, no paranoia or hallucinations. [name] reports she was taking Benadryl BID and was sleeping a lot, this has improved with discontinuation of Benadryl. Sleep now is pretty good, no wandering behavior.She is independent with dressing and bathing.   She denies any headaches, dizziness, diplopia, dysarthria/dysphagia, neck/back pain, focal numbness/tingling/weakness, bowel dysfunction, anosmia or tremors. She denies any urinary incontinence but wears pads. She denies any family history of dementia, no alcohol use. She recalls a bowl hitting her head at work several years ago, no significant head injuries.    I personally reviewed MRI brain with and without contrast done 09/2018 which did not show any acute changes. There was mild diffuse volume loss, moderate chronic microvascular disease.  PREVIOUS MEDICATIONS:   CURRENT MEDICATIONS:  Outpatient Encounter Medications as of 04/11/2022  Medication Sig   aspirin  81 MG tablet Take 81 mg by mouth daily.   benazepril (LOTENSIN) 40 MG tablet Take 1 tablet (40 mg total) by mouth daily.   cholecalciferol (VITAMIN D3) 25 MCG (1000 UNIT) tablet Take 1,000 Units by mouth daily.   donepezil (ARICEPT) 10 MG tablet Take half tablet (5 mg) daily for 2 weeks, then increase to the full tablet at 10 mg daily   glucose blood (ONETOUCH ULTRA) test strip CHECK BLOOD SUGAR 2 TIMES A DAY Dx E11.9   Insulin Pen Needle (PEN NEEDLES) 31G X 5 MM MISC Use to give insulin daily Dx E11.9   isosorbide mononitrate (IMDUR) 60 MG 24 hr tablet Take 1 tablet (60 mg total) by mouth daily.   levothyroxine (SYNTHROID) 150 MCG tablet Take 1 tablet (150 mcg total) by mouth daily.   simvastatin (ZOCOR) 10 MG tablet Take 1 tablet (10 mg total) by mouth daily.   vitamin B-12 (CYANOCOBALAMIN) 500 MCG tablet Take 500 mcg by mouth daily.   vitamin E 200 UNIT capsule Take 200 Units by mouth daily.   No  facility-administered encounter medications on file as of 04/11/2022.       07/01/2021   12:33 PM 11/15/2018    9:00 AM 10/02/2016   11:30 AM  MMSE - Mini Mental State Exam  Orientation to time '3 4 5  '$ Orientation to Place '3 5 5  '$ Registration '3 3 3  '$ Attention/ Calculation '4 4 5  '$ Recall 2 0 0  Language- name 2 objects '2 2 2  '$ Language- repeat '1 1 1  '$ Language- follow 3 step command '2 3 3  '$ Language- read & follow direction '1 1 1  '$ Write a sentence '1 1 1  '$ Copy design '1 1 1  '$ Total score '23 25 27      '$ 03/09/2022   10:00 AM 06/04/2019    2:00 PM  Montreal Cognitive Assessment   Visuospatial/ Executive (0/5) 2   Naming (0/3) 2   Attention: Read list of digits (0/2) 2 1  Attention: Read list of letters (0/1) 0 1  Attention: Serial 7 subtraction starting at 100 (0/3) 0 0  Language: Repeat phrase (0/2) 1 0  Language : Fluency (0/1) 0 1  Abstraction (0/2) 2 2  Delayed Recall (0/5) 0 0  Orientation (0/6) 4 5  Total 13   Adjusted Score (based on education) 14     Objective:     PHYSICAL EXAMINATION:    VITALS:   Vitals:   04/11/22 0925  BP: 130/72  Pulse: 77  Resp: 18  SpO2: 97%  Weight: 131 lb (59.4 kg)  Height: '5\' 4"'$  (1.626 m)    GEN:  The patient appears stated age and is in NAD. HEENT:  Normocephalic, atraumatic.   Neurological examination:  General: NAD, well-groomed, appears stated age. Orientation: The patient is alert. Oriented to person, place and not to date Cranial nerves: There is good facial symmetry.The speech is fluent and clear but repetitive. No aphasia or dysarthria. Fund of knowledge is appropriate. Recent and remote memory are impaired. Attention and concentration are reduced.  Able to name objects and repeat phrases.  Hearing is intact to conversational tone.    Sensation: Sensation is intact to light touch throughout Motor: Strength is at least antigravity x4. Tremors: none  DTR's 2/4 in UE/LE     Movement examination: Tone: There is normal tone  in the UE/LE Abnormal movements:  no tremor.  No myoclonus.  No asterixis.   Coordination:  There is no  decremation with RAM's. Normal finger to nose  Gait and Station: The patient has no difficulty arising out of a deep-seated chair without the use of the hands. The patient's stride length is good.  Gait is cautious and narrow.    Thank you for allowing [place] the opportunity to participate in the care of this nice patient. Please do not hesitate to contact us for any questions or concerns.   Total time spent on today's visit was 30 minutes dedicated to this patient today, preparing to see patient, examining the patient, ordering tests and/or medications and counseling the patient, documenting clinical information in the EHR or other health record, independently interpreting results and communicating results to the patient/family, discussing treatment and goals, answering patient's questions and coordinating care.  Cc:  [name], MD  Sharene Butters 04/11/2022 11:42 AM

## 2022-04-11 NOTE — Patient Instructions (Addendum)
It was a pleasure to see you today at our office.   Recommendations:  Neurocognitive evaluation at our office COntinue donepezil 10 mg daily. COntinue to monitor the thyroid  Referral to [facility] for safety Follow up in  6 months  Whom to call:  Memory  decline, memory medications: Call out office [phone]   For psychiatric meds, mood meds: Please have your primary care physician manage these medications.   Counseling regarding caregiver distress, including caregiver depression, anxiety and issues regarding community resources, adult day care programs, adult living facilities, or memory care questions:   Feel free to contact [place], Social Worker at [phone]   For assessment of decision of mental capacity and competency:  Call Dr. Anthoney Harada, geriatric psychiatrist at [phone]  For guidance in geriatric dementia issues please call [facility] [phone]  For guidance regarding [facility] and if placement were needed at the facility, contact Arnell Asal, Social Worker tel: [phone]  If you have any severe symptoms of a stroke, or other severe issues such as confusion,severe chills or fever, etc call 911 or go to the ER as you may need to be evaluate further   Feel free to visit Facebook page " [email or web address]" for tips of how to care for people with memory problems.      RECOMMENDATIONS FOR ALL PATIENTS WITH MEMORY PROBLEMS: 1. Continue to exercise (Recommend 30 minutes of walking everyday, or 3 hours every week) 2. Increase social interactions - continue going to [place] and enjoy social gatherings with friends and family 3. Eat healthy, avoid fried foods and eat more fruits and vegetables 4. Maintain adequate blood pressure, blood sugar, and blood cholesterol level. Reducing the risk of stroke and cardiovascular disease also helps promoting better memory. 5. Avoid stressful situations. Live a simple life and avoid  aggravations. Organize your time and prepare for the next day in anticipation. 6. Sleep well, avoid any interruptions of sleep and avoid any distractions in the bedroom that may interfere with adequate sleep quality 7. Avoid sugar, avoid sweets as there is a strong link between excessive sugar intake, diabetes, and cognitive impairment We discussed the Mediterranean diet, which has been shown to help patients reduce the risk of progressive memory disorders and reduces cardiovascular risk. This includes eating fish, eat fruits and green leafy vegetables, nuts like almonds and hazelnuts, walnuts, and also use olive oil. Avoid fast foods and fried foods as much as possible. Avoid sweets and sugar as sugar use has been linked to worsening of memory function.  There is always a concern of gradual progression of memory problems. If this is the case, then we may need to adjust level of care according to patient needs. Support, both to the patient and caregiver, should then be put into place.    The Alzheimer's Association is here all day, every day for people facing Alzheimer's disease through our free 24/7 Helpline: 3180440059. The Helpline provides reliable information and support to all those who need assistance, such as individuals living with memory loss, Alzheimer's or other dementia, caregivers, health care professionals and the public.  Our highly trained and knowledgeable staff can help you with: Understanding memory loss, dementia and Alzheimer's  Medications and other treatment options  General information about aging and brain health  Skills to provide quality care and to find the best care from professionals  Legal, financial and living-arrangement decisions Our Helpline also features: Confidential care consultation provided by master's level clinicians who  can help with decision-making support, crisis assistance and education on issues families face every day  Help in a caller's preferred  language using our translation service that features more than 200 languages and dialects  Referrals to local community programs, services and ongoing support     FALL PRECAUTIONS: Be cautious when walking. Scan the area for obstacles that may increase the risk of trips and falls. When getting up in the mornings, sit up at the edge of the bed for a few minutes before getting out of bed. Consider elevating the bed at the head end to avoid drop of blood pressure when getting up. Walk always in a well-lit room (use night lights in the walls). Avoid area rugs or power cords from appliances in the middle of the walkways. Use a walker or a cane if necessary and consider physical therapy for balance exercise. Get your eyesight checked regularly.  FINANCIAL OVERSIGHT: Supervision, especially oversight when making financial decisions or transactions is also recommended.  HOME SAFETY: Consider the safety of the kitchen when operating appliances like stoves, microwave oven, and blender. Consider having supervision and share cooking responsibilities until no longer able to participate in those. Accidents with firearms and other hazards in the house should be identified and addressed as well.   ABILITY TO BE LEFT ALONE: If patient is unable to contact 911 operator, consider using LifeLine, or when the need is there, arrange for someone to stay with patients. Smoking is a fire hazard, consider supervision or cessation. Risk of wandering should be assessed by caregiver and if detected at any point, supervision and safe proof recommendations should be instituted.  MEDICATION SUPERVISION: Inability to self-administer medication needs to be constantly addressed. Implement a mechanism to ensure safe administration of the medications.   DRIVING: Regarding driving, in patients with progressive memory problems, driving will be impaired. We advise to have someone else do the driving if trouble finding directions or if  minor accidents are reported. Independent driving assessment is available to determine safety of driving.   If you are interested in the driving assessment, you can contact the following:  [employer] in [place]  [place] [phone] or [phone]      [place] refers to food and lifestyle choices that are based on the traditions of countries located on the [employer]. This way of eating has been shown to help prevent certain conditions and improve outcomes for people who have chronic diseases, like kidney disease and heart disease. What are tips for following this plan? Lifestyle  Cook and eat meals together with your family, when possible. Drink enough fluid to keep your urine clear or pale yellow. Be physically active every day. This includes: Aerobic exercise like running or swimming. Leisure activities like gardening, walking, or housework. Get 7-8 hours of sleep each night. If recommended by your health care provider, drink red wine in moderation. This means 1 glass a day for nonpregnant women and 2 glasses a day for men. A glass of wine equals 5 oz (150 mL). Reading food labels  Check the serving size of packaged foods. For foods such as rice and pasta, the serving size refers to the amount of cooked product, not dry. Check the total fat in packaged foods. Avoid foods that have saturated fat or trans fats. Check the ingredients list for added sugars, such as corn syrup. Shopping  At [employer],  buy most of your food from the areas near the walls of the store. This includes: Fresh fruits and vegetables (produce). Grains, beans, nuts, and seeds. Some of these may be available in unpackaged forms or large amounts (in bulk). Fresh seafood. Poultry and eggs. Low-fat dairy products. Buy whole ingredients instead of  prepackaged foods. Buy fresh fruits and vegetables in-season from local farmers markets. Buy frozen fruits and vegetables in resealable bags. If you do not have access to quality fresh seafood, buy precooked frozen shrimp or canned fish, such as tuna, salmon, or sardines. Buy small amounts of raw or cooked vegetables, salads, or olives from the deli or salad bar at your store. Stock your pantry so you always have certain foods on hand, such as olive oil, canned tuna, canned tomatoes, rice, pasta, and beans. Cooking  Cook foods with extra-virgin olive oil instead of using butter or other vegetable oils. Have meat as a side dish, and have vegetables or grains as your main dish. This means having meat in small portions or adding small amounts of meat to foods like pasta or stew. Use beans or vegetables instead of meat in common dishes like chili or lasagna. Experiment with different cooking methods. Try roasting or broiling vegetables instead of steaming or sauteing them. Add frozen vegetables to soups, stews, pasta, or rice. Add nuts or seeds for added healthy fat at each meal. You can add these to yogurt, salads, or vegetable dishes. Marinate fish or vegetables using olive oil, lemon juice, garlic, and fresh herbs. Meal planning  Plan to eat 1 vegetarian meal one day each week. Try to work up to 2 vegetarian meals, if possible. Eat seafood 2 or more times a week. Have healthy snacks readily available, such as: Vegetable sticks with hummus. Greek yogurt. Fruit and nut trail mix. Eat balanced meals throughout the week. This includes: Fruit: 2-3 servings a day Vegetables: 4-5 servings a day Low-fat dairy: 2 servings a day Fish, poultry, or lean meat: 1 serving a day Beans and legumes: 2 or more servings a week Nuts and seeds: 1-2 servings a day Whole grains: 6-8 servings a day Extra-virgin olive oil: 3-4 servings a day Limit red meat and sweets to only a few servings a month What are my  food choices? Mediterranean diet Recommended Grains: Whole-grain pasta. Brown rice. Bulgar wheat. Polenta. Couscous. Whole-wheat bread. [place]. Vegetables: Artichokes. Beets. Broccoli. Cabbage. Carrots. Eggplant. Green beans. Chard. Kale. Spinach. Onions. Leeks. Peas. Squash. Tomatoes. Peppers. Radishes. Fruits: Apples. Apricots. Avocado. Berries. Bananas. Cherries. Dates. Figs. Grapes. Lemons. Melon. Oranges. Peaches. Plums. Pomegranate. Meats and other protein foods: Beans. Almonds. Sunflower seeds. Pine nuts. Peanuts. [place]. Salmon. Scallops. Shrimp. Towns. Tilapia. Clams. Oysters. Eggs. Dairy: Low-fat milk. Cheese. Greek yogurt. Beverages: Water. Red wine. Herbal tea. Fats and oils: Extra virgin olive oil. Avocado oil. Grape seed oil. Sweets and desserts: [place] yogurt with honey. Baked apples. Poached pears. Trail mix. Seasoning and other foods: Basil. Cilantro. Coriander. Cumin. Mint. Parsley. Sage. Rosemary. Tarragon. Garlic. Oregano. Thyme. Pepper. Balsalmic vinegar. Tahini. Hummus. Tomato sauce. Olives. Mushrooms. Limit these Grains: Prepackaged pasta or rice dishes. Prepackaged cereal with added sugar. Vegetables: Deep fried potatoes (french fries). Fruits: Fruit canned in syrup. Meats and other protein foods: Beef. Pork. Lamb. Poultry with skin. Hot dogs. [place] Salines. Dairy: Ice cream. Sour cream. Whole milk. Beverages: Juice. Sugar-sweetened soft drinks. Beer. Liquor and spirits. Fats and oils: Butter. Canola oil. Vegetable oil. Beef fat (tallow). Lard. Sweets and desserts: Cookies. Cakes. Pies. Candy. Seasoning  and other foods: Mayonnaise. Premade sauces and marinades. The items listed may not be a complete list. Talk with your dietitian about what dietary choices are right for you. Summary The Mediterranean diet includes both food and lifestyle choices. Eat a variety of fresh fruits and vegetables, beans, nuts, seeds, and whole grains. Limit the amount of red meat and sweets  that you eat. Talk with your health care provider about whether it is safe for you to drink red wine in moderation. This means 1 glass a day for nonpregnant women and 2 glasses a day for men. A glass of wine equals 5 oz (150 mL). This information is not intended to replace advice given to you by your health care provider. Make sure you discuss any questions you have with your health care provider. Document Released: 06/22/2016 Document Revised: 07/25/2016 Document Reviewed: 06/22/2016 Elsevier Interactive Patient Education  2017 [employer].

## 2022-04-17 ENCOUNTER — Other Ambulatory Visit: Payer: Self-pay

## 2022-04-17 ENCOUNTER — Telehealth: Payer: Self-pay | Admitting: Physician Assistant

## 2022-04-17 DIAGNOSIS — F0150 Vascular dementia without behavioral disturbance: Secondary | ICD-10-CM

## 2022-04-17 DIAGNOSIS — F03918 Unspecified dementia, unspecified severity, with other behavioral disturbance: Secondary | ICD-10-CM

## 2022-04-17 NOTE — Telephone Encounter (Signed)
I called daughter and she is going to contact her insurance. She can't afford anything so I ask her to call due to [facility]

## 2022-04-17 NOTE — Telephone Encounter (Signed)
Patients daughter said the social worker doesn't set up home health, so she doesn't know why she was told to contact her. She needs to speak with someone about setting up home health

## 2022-04-17 NOTE — Telephone Encounter (Signed)
I have tried multiple home therapys no one I can find at this time that can take her insurance being in [place] was suppose to be a reference only. I tried to call daughter back no answer at 11:20am

## 2022-05-04 ENCOUNTER — Telehealth: Payer: Self-pay | Admitting: Family Medicine

## 2022-05-04 NOTE — Telephone Encounter (Signed)
FYI

## 2022-05-24 ENCOUNTER — Ambulatory Visit: Payer: Self-pay | Admitting: *Deleted

## 2022-05-24 DIAGNOSIS — I10 Essential (primary) hypertension: Secondary | ICD-10-CM

## 2022-05-24 DIAGNOSIS — E119 Type 2 diabetes mellitus without complications: Secondary | ICD-10-CM

## 2022-05-24 NOTE — Chronic Care Management (AMB) (Signed)
  Chronic Care Management   Note  05/24/2022 Name: Brianna Weber MRN: [id number] DOB: February 17, 1946   Patient has not recently engaged with the Chronic Care Management RN Care Manager. Removing RN Care Manager from Care Team and closing [place]. If patient is currently engaged with another CCM team member I will forward this encounter to inform them of my case closure. Patient may be eligible for re-engagement with RN Care Manager in the future if necessary and can discuss this with their PCP.  Chong Sicilian, BSN, RN-BC Embedded Chronic Care Manager [facility] [facility] Direct Dial: [phone]

## 2022-05-24 NOTE — Patient Instructions (Signed)
Brianna Weber  I have enjoyed working with you through the Chronic Care Management Program at [facility]. Due to program changes and no recent contact with the [facility], I am removing myself from your care team. If you are currently active with another CCM Team Member, you will remain active with them unless they reach out to you with additional information. If you feel that you need RN Care Management services in the future, please talk with your primary care provider to discuss re-engagement with the RN Care Manager.   Thank you for allowing me to participate in your your healthcare journey.  Chong Sicilian, BSN, RN-BC Embedded Chronic Care Manager [facility] Direct Dial: [phone]

## 2022-07-27 ENCOUNTER — Telehealth: Payer: Self-pay | Admitting: Family Medicine

## 2022-07-27 ENCOUNTER — Telehealth: Payer: Self-pay | Admitting: Physician Assistant

## 2022-07-27 NOTE — Telephone Encounter (Signed)
Pts daughter called, she wants to get an appt sooner than November. Brianna Weber is not remembering names, not keeping up with the house chores. Not eating. There has been a drastic change. Her daughter is calling her PCP now to get complete lab work done so sara can have it

## 2022-07-28 ENCOUNTER — Ambulatory Visit (INDEPENDENT_AMBULATORY_CARE_PROVIDER_SITE_OTHER): Payer: Medicare Other | Admitting: Family Medicine

## 2022-07-28 ENCOUNTER — Encounter: Payer: Self-pay | Admitting: Family Medicine

## 2022-07-28 VITALS — BP 139/82 | HR 53 | Ht 64.0 in | Wt 129.0 lb

## 2022-07-28 DIAGNOSIS — N3001 Acute cystitis with hematuria: Secondary | ICD-10-CM

## 2022-07-28 DIAGNOSIS — R410 Disorientation, unspecified: Secondary | ICD-10-CM | POA: Diagnosis not present

## 2022-07-28 DIAGNOSIS — R630 Anorexia: Secondary | ICD-10-CM

## 2022-07-28 DIAGNOSIS — R6889 Other general symptoms and signs: Secondary | ICD-10-CM | POA: Diagnosis not present

## 2022-07-28 LAB — MICROSCOPIC EXAMINATION: Renal Epithel, UA: NONE SEEN /hpf

## 2022-07-28 LAB — URINALYSIS, ROUTINE W REFLEX MICROSCOPIC
Bilirubin, UA: NEGATIVE
Glucose, UA: NEGATIVE
Nitrite, UA: POSITIVE — AB
Specific Gravity, UA: 1.015 (ref 1.005–1.030)
Urobilinogen, Ur: 0.2 mg/dL (ref 0.2–1.0)
pH, UA: 5.5 (ref 5.0–7.5)

## 2022-07-28 MED ORDER — SULFAMETHOXAZOLE-TRIMETHOPRIM 800-160 MG PO TABS: 1.0000 | ORAL_TABLET | Freq: Two times a day (BID) | ORAL | 0 refills | Status: AC

## 2022-07-28 NOTE — Progress Notes (Signed)
Subjective:  Patient ID: Brianna Weber, female    DOB: 1946/04/23, 77 y.o.   MRN: [id number]  Patient Care Team: [name], MD as PCP - General (Family Medicine) Lavera Guise, [facility] (Pharmacist)   Chief Complaint:  Anorexia (dementia)   HPI: Brianna Weber is a 76 y.o. female presenting on 07/28/2022 for Anorexia (dementia)   Pt presents today with her daughter. Daughter states pt has had increased confusion, loss of appetite and malaise for the last 3 days. Daughter states when she arrived this morning, pt had been incontinent of urine and she had to clean her up and dress her. States this is not normal for pt as she usually performs ADLs by herself. No reported fever, chills, nausea, vomiting, or diarrhea.     Relevant past medical, surgical, family, and social history reviewed and updated as indicated.  Allergies and medications reviewed and updated. Data reviewed: Chart in Epic.   Past Medical History:  Diagnosis Date   Allergy    Arrhythmia    bradycardia   Breast cancer [facility])    s/p left mastectomy   Diabetes mellitus    Diverticulosis of colon    Hyperlipidemia    Hypertension    NSTEMI (non-ST elevated myocardial infarction) ([place])    2004 secondary to stress induced cardiomyopathy. Normal LV function by repeatr echo.   Peripheral edema    Thyroid disease    hypothyroidism   Vitamin D deficiency     Past Surgical History:  Procedure Laterality Date   ABDOMINAL HYSTERECTOMY     CHOLECYSTECTOMY     MASTECTOMY     left    Social History   Socioeconomic History   Marital status: Widowed    Spouse name: Not on file   Number of children: 4   Years of education: 9   Highest education level: 9th grade  Occupational History   Occupation: retired  Tobacco Use   Smoking status: Never   Smokeless tobacco: Never  Vaping Use   Vaping Use: Never used  Substance and Sexual Activity   Alcohol use: No   Drug use: No   Sexual activity: Not  Currently    Birth control/protection: Surgical  Other Topics Concern   Not on file  Social History Narrative   Pt is right handed   Has 4 biological children and 4 step children   9th grade education   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, water quality at [employer]      09/02/19-pt states she lives alone    Has 9th grade education      No longer drives - daughter and sister take her to appointments and shopping   Social Determinants of Health   Financial Resource Strain: Low Risk  (09/05/2021)   Overall Financial Resource Strain (CARDIA)    Difficulty of Paying Living Expenses: Not hard at all  Food Insecurity: No Food Insecurity (09/05/2021)   Hunger Vital Sign    Worried About Running Out of Food in the Last Year: Never true    [place] in the Last Year: Never true  Transportation Needs: No Transportation Needs (09/05/2021)   PRAPARE - Hydrologist (Medical): No    Lack of Transportation (Non-Medical): No  Physical Activity: Insufficiently Active (09/05/2021)   Exercise Vital Sign    Days of Exercise per Week: 7 days    Minutes of Exercise per Session: 10 min  Stress: No Stress Concern Present (09/05/2021)  [place] Questionnaire    Feeling of Stress : Only a little  Social Connections: Socially Isolated (09/05/2021)   Social Connection and Isolation Panel [NHANES]    Frequency of Communication with Friends and Family: More than three times a week    Frequency of Social Gatherings with Friends and Family: Once a week    Attends Religious Services: Never    Marine scientist or Organizations: No    Attends Archivist Meetings: Never    Marital Status: Widowed  Intimate Partner Violence: Not At Risk (09/05/2021)   Humiliation, Afraid, Rape, and Kick questionnaire    Fear of Current or Ex-Partner: No    Emotionally Abused: No    Physically Abused: No    Sexually Abused: No    Outpatient  Encounter Medications as of 07/28/2022  Medication Sig   aspirin 81 MG tablet Take 81 mg by mouth daily.   benazepril (LOTENSIN) 40 MG tablet Take 1 tablet (40 mg total) by mouth daily.   cholecalciferol (VITAMIN D3) 25 MCG (1000 UNIT) tablet Take 1,000 Units by mouth daily.   donepezil (ARICEPT) 10 MG tablet Take half tablet (5 mg) daily for 2 weeks, then increase to the full tablet at 10 mg daily   glucose blood (ONETOUCH ULTRA) test strip CHECK BLOOD SUGAR 2 TIMES A DAY Dx E11.9   Insulin Pen Needle (PEN NEEDLES) 31G X 5 MM MISC Use to give insulin daily Dx E11.9   isosorbide mononitrate (IMDUR) 60 MG 24 hr tablet Take 1 tablet (60 mg total) by mouth daily.   levothyroxine (SYNTHROID) 150 MCG tablet Take 1 tablet (150 mcg total) by mouth daily.   simvastatin (ZOCOR) 10 MG tablet Take 1 tablet (10 mg total) by mouth daily.   sulfamethoxazole-trimethoprim (BACTRIM DS) 800-160 MG tablet Take 1 tablet by mouth 2 (two) times daily for 7 days.   vitamin B-12 (CYANOCOBALAMIN) 500 MCG tablet Take 500 mcg by mouth daily.   vitamin E 200 UNIT capsule Take 200 Units by mouth daily.   No facility-administered encounter medications on file as of 07/28/2022.    No Known Allergies  Review of Systems  Constitutional:  Positive for activity change, appetite change and fatigue. Negative for chills, diaphoresis, fever and unexpected weight change.  HENT: Negative.    Eyes: Negative.   Respiratory:  Negative for cough, chest tightness and shortness of breath.   Cardiovascular:  Negative for chest pain, palpitations and leg swelling.  Gastrointestinal:  Negative for blood in stool, constipation, diarrhea, nausea and vomiting.  Endocrine: Negative.   Genitourinary:  Positive for frequency.       Incontinent of urine today  Musculoskeletal:  Negative for arthralgias and myalgias.  Skin: Negative.   Allergic/Immunologic: Negative.   Neurological:  Positive for weakness.  Hematological: Negative.    Psychiatric/Behavioral:  Positive for confusion. Negative for hallucinations, sleep disturbance and suicidal ideas.   All other systems reviewed and are negative.       Objective:  BP 139/82   Pulse (!) 53   Ht _0  (1.626 m)   Wt 129 lb (58.5 kg)   SpO2 95%   BMI 22.14 kg/m    Wt Readings from Last 3 Encounters:  07/28/22 129 lb (58.5 kg)  04/11/22 131 lb (59.4 kg)  03/09/22 131 lb (59.4 kg)    Physical Exam Vitals and nursing note reviewed.  Constitutional:      Appearance: She is normal weight.  Comments: Chronically ill  HENT:     Head: Normocephalic and atraumatic.     Nose: Nose normal.     Mouth/Throat:     Mouth: Mucous membranes are moist.  Eyes:     Pupils: Pupils are equal, round, and reactive to light.  Cardiovascular:     Rate and Rhythm: Normal rate and regular rhythm.     Heart sounds: Normal heart sounds.  Pulmonary:     Effort: Pulmonary effort is normal.     Breath sounds: Normal breath sounds.  Abdominal:     General: Bowel sounds are normal.     Palpations: Abdomen is soft.     Tenderness: There is no abdominal tenderness. There is no right CVA tenderness or left CVA tenderness.  Musculoskeletal:     Right lower leg: No edema.     Left lower leg: No edema.  Skin:    General: Skin is warm and dry.     Capillary Refill: Capillary refill takes less than 2 seconds.  Neurological:     Mental Status: She is alert. She is confused.     Motor: Weakness (generalized) present.     Gait: Gait abnormal (in wheelchair).  Psychiatric:        Speech: Speech normal.        Cognition and Memory: Cognition is impaired. Memory is impaired.     Results for orders placed or performed in visit on 03/09/22  Vitamin B12  Result Value Ref Range   Vitamin B-12 976 (H) 211 - 911 pg/mL  TSH  Result Value Ref Range   TSH 184.74 (H) 0.35 - 5.50 uIU/mL  T4  Result Value Ref Range   T4, Total 4.3 (L) 5.1 - 11.9 mcg/dL     Urinalysis in office: trace  ketones, 1+ blood, positive nitrites, 3+ leukocytes, many bacteria. Culture pending.   Pertinent labs & imaging results that were available during my care of the patient were reviewed by me and considered in my medical decision making.  Assessment & Plan:  Nolene was seen today for anorexia.  Diagnoses and all orders for this visit:  Confusion Loss of appetite Daughter reports increased confusion, loss of appetite, and malaise for the last 3 days. Urinalysis significant for cystitis. Urine culture and other labs pending. Has follow up with neurology next week. No indications of urosepsis. Antibiotic therapy based off of last culture, will change if warranted. Report new, worsening, or persistent symptoms.  -     CBC with Differential/Platelet -     CMP14+EGFR -     Vitamin B12 -     VITAMIN D 25 Hydroxy (Vit-D Deficiency, Fractures) -     Thyroid Panel With TSH -     Urinalysis, Routine w reflex microscopic -     Urine Culture  Acute cystitis with hematuria Medications as prescribed. Culture pending, will change medications if warranted.  -     sulfamethoxazole-trimethoprim (BACTRIM DS) 800-160 MG tablet; Take 1 tablet by mouth 2 (two) times daily for 7 days.     Continue all other maintenance medications.  Follow up plan: Return in about 1 month (around 08/27/2022), or if symptoms worsen or fail to improve.   Continue healthy lifestyle choices, including diet (rich in fruits, vegetables, and lean proteins, and low in salt and simple carbohydrates) and exercise (at least 30 minutes of moderate physical activity daily).  Educational handout given for UTI  The above assessment and management plan was discussed with the patient.  The patient verbalized understanding of and has agreed to the management plan. Patient is aware to call the clinic if they develop any new symptoms or if symptoms persist or worsen. Patient is aware when to return to the clinic for a follow-up visit.  Patient educated on when it is appropriate to go to the emergency department.   Monia Pouch, FNP-C [facility] [phone]

## 2022-07-29 LAB — CMP14+EGFR
ALT: 22 IU/L (ref 0–32)
AST: 40 IU/L (ref 0–40)
Albumin/Globulin Ratio: 1.7 (ref 1.2–2.2)
Albumin: 4.7 g/dL (ref 3.8–4.8)
Alkaline Phosphatase: 106 IU/L (ref 44–121)
BUN/Creatinine Ratio: 5 — ABNORMAL LOW (ref 12–28)
BUN: 10 mg/dL (ref 8–27)
Bilirubin Total: 1.7 mg/dL — ABNORMAL HIGH (ref 0.0–1.2)
CO2: 25 mmol/L (ref 20–29)
Calcium: 10.5 mg/dL — ABNORMAL HIGH (ref 8.7–10.3)
Chloride: 90 mmol/L — ABNORMAL LOW (ref 96–106)
Creatinine, Ser: 2.04 mg/dL — ABNORMAL HIGH (ref 0.57–1.00)
Globulin, Total: 2.7 g/dL (ref 1.5–4.5)
Glucose: 249 mg/dL — ABNORMAL HIGH (ref 70–99)
Potassium: 3.4 mmol/L — ABNORMAL LOW (ref 3.5–5.2)
Sodium: 136 mmol/L (ref 134–144)
Total Protein: 7.4 g/dL (ref 6.0–8.5)
eGFR: 25 mL/min/{1.73_m2} — ABNORMAL LOW (ref >59)

## 2022-07-29 LAB — CBC WITH DIFFERENTIAL/PLATELET
Basophils Absolute: 0.1 10*3/uL (ref 0.0–0.2)
Basos: 1 %
EOS (ABSOLUTE): 0.1 10*3/uL (ref 0.0–0.4)
Eos: 1 %
Hematocrit: 43 % (ref 34.0–46.6)
Hemoglobin: 14.5 g/dL (ref 11.1–15.9)
Immature Grans (Abs): 0.1 10*3/uL (ref 0.0–0.1)
Immature Granulocytes: 1 %
Lymphocytes Absolute: 1.5 10*3/uL (ref 0.7–3.1)
Lymphs: 15 %
MCH: 29.5 pg (ref 26.6–33.0)
MCHC: 33.7 g/dL (ref 31.5–35.7)
MCV: 87 fL (ref 79–97)
Monocytes Absolute: 0.5 10*3/uL (ref 0.1–0.9)
Monocytes: 5 %
Neutrophils Absolute: 7.8 10*3/uL — ABNORMAL HIGH (ref 1.4–7.0)
Neutrophils: 77 %
Platelets: 318 10*3/uL (ref 150–450)
RBC: 4.92 x10E6/uL (ref 3.77–5.28)
RDW: 13.2 % (ref 11.7–15.4)
WBC: 10.1 10*3/uL (ref 3.4–10.8)

## 2022-07-29 LAB — THYROID PANEL WITH TSH
Free Thyroxine Index: 0.5 — ABNORMAL LOW (ref 1.2–4.9)
T3 Uptake Ratio: 17 % — ABNORMAL LOW (ref 24–39)
T4, Total: 2.8 ug/dL — ABNORMAL LOW (ref 4.5–12.0)
TSH: 270 u[IU]/mL — ABNORMAL HIGH (ref 0.450–4.500)

## 2022-07-29 LAB — VITAMIN B12: Vitamin B-12: 1635 pg/mL — ABNORMAL HIGH (ref 232–1245)

## 2022-07-29 LAB — VITAMIN D 25 HYDROXY (VIT D DEFICIENCY, FRACTURES): Vit D, 25-Hydroxy: 16.8 ng/mL — ABNORMAL LOW (ref 30.0–100.0)

## 2022-08-01 LAB — URINE CULTURE

## 2022-08-03 ENCOUNTER — Ambulatory Visit: Payer: Medicare Other | Admitting: Physician Assistant

## 2022-08-03 ENCOUNTER — Encounter: Payer: Self-pay | Admitting: Physician Assistant

## 2022-08-03 VITALS — BP 137/86 | HR 61 | Resp 18 | Ht 64.0 in | Wt 128.0 lb

## 2022-08-03 DIAGNOSIS — F0150 Vascular dementia without behavioral disturbance: Secondary | ICD-10-CM | POA: Diagnosis not present

## 2022-08-03 DIAGNOSIS — F0280 Dementia in other diseases classified elsewhere without behavioral disturbance: Secondary | ICD-10-CM | POA: Diagnosis not present

## 2022-08-03 DIAGNOSIS — G309 Alzheimer's disease, unspecified: Secondary | ICD-10-CM | POA: Diagnosis not present

## 2022-08-03 DIAGNOSIS — E038 Other specified hypothyroidism: Secondary | ICD-10-CM | POA: Diagnosis not present

## 2022-08-03 NOTE — Patient Instructions (Signed)
It was a pleasure to see you today at our office.   Recommendations:  Neurocognitive evaluation at our office COntinue donepezil 10 mg daily. COntinue to monitor the thyroid. Referral to Endocrinology   Referral to [facility] for safety Follow up in  6 months  Whom to call:  Memory  decline, memory medications: Call out office [phone]   For psychiatric meds, mood meds: Please have your primary care physician manage these medications.   Counseling regarding caregiver distress, including caregiver depression, anxiety and issues regarding community resources, adult day care programs, adult living facilities, or memory care questions:   Feel free to contact [place], Social Worker at [phone]   For assessment of decision of mental capacity and competency:  Call Dr. Anthoney Harada, geriatric psychiatrist at [phone]  For guidance in geriatric dementia issues please call [facility] [phone]  For guidance regarding [facility] and if placement were needed at the facility, contact Arnell Asal, Social Worker tel: [phone]  If you have any severe symptoms of a stroke, or other severe issues such as confusion,severe chills or fever, etc call 911 or go to the ER as you may need to be evaluate further   Feel free to visit Facebook page " [email or web address]" for tips of how to care for people with memory problems.      RECOMMENDATIONS FOR ALL PATIENTS WITH MEMORY PROBLEMS: 1. Continue to exercise (Recommend 30 minutes of walking everyday, or 3 hours every week) 2. Increase social interactions - continue going to [place] and enjoy social gatherings with friends and family 3. Eat healthy, avoid fried foods and eat more fruits and vegetables 4. Maintain adequate blood pressure, blood sugar, and blood cholesterol level. Reducing the risk of stroke and cardiovascular disease also helps promoting better memory. 5. Avoid stressful situations. Live  a simple life and avoid aggravations. Organize your time and prepare for the next day in anticipation. 6. Sleep well, avoid any interruptions of sleep and avoid any distractions in the bedroom that may interfere with adequate sleep quality 7. Avoid sugar, avoid sweets as there is a strong link between excessive sugar intake, diabetes, and cognitive impairment We discussed the Mediterranean diet, which has been shown to help patients reduce the risk of progressive memory disorders and reduces cardiovascular risk. This includes eating fish, eat fruits and green leafy vegetables, nuts like almonds and hazelnuts, walnuts, and also use olive oil. Avoid fast foods and fried foods as much as possible. Avoid sweets and sugar as sugar use has been linked to worsening of memory function.  There is always a concern of gradual progression of memory problems. If this is the case, then we may need to adjust level of care according to patient needs. Support, both to the patient and caregiver, should then be put into place.    The Alzheimer's Association is here all day, every day for people facing Alzheimer's disease through our free 24/7 Helpline: [phone]. The Helpline provides reliable information and support to all those who need assistance, such as individuals living with memory loss, Alzheimer's or other dementia, caregivers, health care professionals and the public.  Our highly trained and knowledgeable staff can help you with: Understanding memory loss, dementia and Alzheimer's  Medications and other treatment options  General information about aging and brain health  Skills to provide quality care and to find the best care from professionals  Legal, financial and living-arrangement decisions Our Helpline also features: Confidential care consultation provided by  master's level clinicians who can help with decision-making support, crisis assistance and education on issues families face every day  Help  in a caller's preferred language using our translation service that features more than 200 languages and dialects  Referrals to local community programs, services and ongoing support     FALL PRECAUTIONS: Be cautious when walking. Scan the area for obstacles that may increase the risk of trips and falls. When getting up in the mornings, sit up at the edge of the bed for a few minutes before getting out of bed. Consider elevating the bed at the head end to avoid drop of blood pressure when getting up. Walk always in a well-lit room (use night lights in the walls). Avoid area rugs or power cords from appliances in the middle of the walkways. Use a walker or a cane if necessary and consider physical therapy for balance exercise. Get your eyesight checked regularly.  FINANCIAL OVERSIGHT: Supervision, especially oversight when making financial decisions or transactions is also recommended.  HOME SAFETY: Consider the safety of the kitchen when operating appliances like stoves, microwave oven, and blender. Consider having supervision and share cooking responsibilities until no longer able to participate in those. Accidents with firearms and other hazards in the house should be identified and addressed as well.   ABILITY TO BE LEFT ALONE: If patient is unable to contact 911 operator, consider using LifeLine, or when the need is there, arrange for someone to stay with patients. Smoking is a fire hazard, consider supervision or cessation. Risk of wandering should be assessed by caregiver and if detected at any point, supervision and safe proof recommendations should be instituted.  MEDICATION SUPERVISION: Inability to self-administer medication needs to be constantly addressed. Implement a mechanism to ensure safe administration of the medications.   DRIVING: Regarding driving, in patients with progressive memory problems, driving will be impaired. We advise to have someone else do the driving if trouble  finding directions or if minor accidents are reported. Independent driving assessment is available to determine safety of driving.   If you are interested in the driving assessment, you can contact the following:  [employer] in [place]  [place] [phone] or [phone]      [place] refers to food and lifestyle choices that are based on the traditions of countries located on the [employer]. This way of eating has been shown to help prevent certain conditions and improve outcomes for people who have chronic diseases, like kidney disease and heart disease. What are tips for following this plan? Lifestyle  Cook and eat meals together with your family, when possible. Drink enough fluid to keep your urine clear or pale yellow. Be physically active every day. This includes: Aerobic exercise like running or swimming. Leisure activities like gardening, walking, or housework. Get 7-8 hours of sleep each night. If recommended by your health care provider, drink red wine in moderation. This means 1 glass a day for nonpregnant women and 2 glasses a day for men. A glass of wine equals 5 oz (150 mL). Reading food labels  Check the serving size of packaged foods. For foods such as rice and pasta, the serving size refers to the amount of cooked product, not dry. Check the total fat in packaged foods. Avoid foods that have saturated fat or trans fats. Check the ingredients list for added sugars, such as corn syrup. Shopping  At the grocery store, buy most of your food from the areas near the walls of the store. This includes: Fresh fruits and vegetables (produce). Grains, beans, nuts, and seeds. Some of these may be available in unpackaged forms or large amounts (in bulk). Fresh seafood. Poultry and eggs. Low-fat dairy products. Buy  whole ingredients instead of prepackaged foods. Buy fresh fruits and vegetables in-season from local farmers markets. Buy frozen fruits and vegetables in resealable bags. If you do not have access to quality fresh seafood, buy precooked frozen shrimp or canned fish, such as tuna, salmon, or sardines. Buy small amounts of raw or cooked vegetables, salads, or olives from the deli or salad bar at your store. Stock your pantry so you always have certain foods on hand, such as olive oil, canned tuna, canned tomatoes, rice, pasta, and beans. Cooking  Cook foods with extra-virgin olive oil instead of using butter or other vegetable oils. Have meat as a side dish, and have vegetables or grains as your main dish. This means having meat in small portions or adding small amounts of meat to foods like pasta or stew. Use beans or vegetables instead of meat in common dishes like chili or lasagna. Experiment with different cooking methods. Try roasting or broiling vegetables instead of steaming or sauteing them. Add frozen vegetables to soups, stews, pasta, or rice. Add nuts or seeds for added healthy fat at each meal. You can add these to yogurt, salads, or vegetable dishes. Marinate fish or vegetables using olive oil, lemon juice, garlic, and fresh herbs. Meal planning  Plan to eat 1 vegetarian meal one day each week. Try to work up to 2 vegetarian meals, if possible. Eat seafood 2 or more times a week. Have healthy snacks readily available, such as: Vegetable sticks with hummus. Greek yogurt. Fruit and nut trail mix. Eat balanced meals throughout the week. This includes: Fruit: 2-3 servings a day Vegetables: 4-5 servings a day Low-fat dairy: 2 servings a day Fish, poultry, or lean meat: 1 serving a day Beans and legumes: 2 or more servings a week Nuts and seeds: 1-2 servings a day Whole grains: 6-8 servings a day Extra-virgin olive oil: 3-4 servings a day Limit red meat and sweets to only a few  servings a month What are my food choices? Mediterranean diet Recommended Grains: Whole-grain pasta. Brown rice. Bulgar wheat. Polenta. Couscous. Whole-wheat bread. [place]. Vegetables: Artichokes. Beets. Broccoli. Cabbage. Carrots. Eggplant. Green beans. Chard. Kale. Spinach. Onions. Leeks. Peas. Squash. Tomatoes. Peppers. Radishes. Fruits: Apples. Apricots. Avocado. Berries. Bananas. Cherries. Dates. Figs. Grapes. Lemons. Melon. Oranges. Peaches. Plums. Pomegranate. Meats and other protein foods: Beans. Almonds. Sunflower seeds. Pine nuts. Peanuts. House. Salmon. Scallops. Shrimp. [place]. Tilapia. Clams. Oysters. Eggs. Dairy: Low-fat milk. Cheese. Greek yogurt. Beverages: Water. Red wine. Herbal tea. Fats and oils: Extra virgin olive oil. Avocado oil. Grape seed oil. Sweets and desserts: [place] yogurt with honey. Baked apples. Poached pears. Trail mix. Seasoning and other foods: Basil. Cilantro. Coriander. Cumin. Mint. Parsley. Sage. Rosemary. Tarragon. Garlic. Oregano. Thyme. Pepper. Balsalmic vinegar. Tahini. Hummus. Tomato sauce. Olives. Mushrooms. Limit these Grains: Prepackaged pasta or rice dishes. Prepackaged cereal with added sugar. Vegetables: Deep fried potatoes (french fries). Fruits: Fruit canned in syrup. Meats and other protein foods: Beef. Pork. Lamb. Poultry with skin. Hot dogs. Berniece Salines. Dairy: Ice cream. Sour cream. Whole milk. Beverages: Juice. Sugar-sweetened soft drinks. Beer. Liquor and spirits. Fats and oils: Butter. Canola oil. Vegetable oil. Beef fat (tallow). Lard. Sweets and desserts: Cookies.  Cakes. Pies. Candy. Seasoning and other foods: Mayonnaise. Premade sauces and marinades. The items listed may not be a complete list. Talk with your dietitian about what dietary choices are right for you. Summary The Mediterranean diet includes both food and lifestyle choices. Eat a variety of fresh fruits and vegetables, beans, nuts, seeds, and whole grains. Limit the  amount of red meat and sweets that you eat. Talk with your health care provider about whether it is safe for you to drink red wine in moderation. This means 1 glass a day for nonpregnant women and 2 glasses a day for men. A glass of wine equals 5 oz (150 mL). This information is not intended to replace advice given to you by your health care provider. Make sure you discuss any questions you have with your health care provider. Document Released: 06/22/2016 Document Revised: 07/25/2016 Document Reviewed: 06/22/2016 Elsevier Interactive Patient Education  2017 [employer].

## 2022-08-03 NOTE — Progress Notes (Signed)
Assessment/Plan:   Dementia likely due to Alzheimer's Disease   Brianna Weber is a very pleasant 76 y.o. RH female with a history of hypothyroidism, history of breast cancer, history of stroke in 2004, and recent history of dementia due to Alzheimer's disease and vascular disease.  She is being seen prior to her scheduled visit in November, as her daughters reported that she has been increasingly confused, her memory is worse.  She has been seen by her PCP 6 days ago, who drew some labs, revealing very abnormal thyroid levels over 270, with very abnormal T4 and T3 in the setting of poor compliance to her thyroid meds due to dementia.  In addition, she was found to have very abnormal CMP with mild hypercalcemia, elevated blood sugars, mild hypokalemia, a creatinine of 2.04.  Vitamin D was low at 16.8.  She has poor fluid intake.  Urine culture positive for E. coli UTI as well.  All these abnormal lab values do contribute to increased confusion and other memory changes.  Most recent MoCA is 14/30, and today MMSE is 15/30..  Brain MRI  on 03/30/2022 showed progression of atrophy and chronic microvascular ischemic changes since 2019. She is on donepezil 10 mg daily, tolerating well.  Since the last week, her daughter has taken over the medications for better compliance.  She does not have an endocrinologist at this time.     Follow up in 2 months as prior scheduled. Patient scheduled for neurocognitive testing in 2024  for clarity of diagnosis and disease trajectory   Continue donepezil 10 mg daily  Continue to monitor mood by PCP Continue to monitor thyroid levels by PCP Referral to endocrinologist [facility]  She needs to have tight control of all her chemistries, and close follow-up with PCP. Recommend vitamin D replenishment as per PCP.    Subjective:    This patient is accompanied in the office by 2 of her daughters who supplements the history.  Previous records as well as any outside  records available were reviewed prior to todays visit.    Any changes in memory since last visit?  "Better today but a couple of days ago she was very confused, could not remember names, could not eat and had generalized malaise, her daughter took her to the PCP, who drew labs, and was found to have several chemistries abnormalities, as well as a UTI.  Her thyroid levels were very elevated, her daughter reports that she has not been taking her medications.  All these abnormalities were likely the culprit of her drastic memory changes.  She continues to repeat herself. Disoriented when walking into a room?  Patient denies   Leaving objects in unusual places?  Patient denies   Ambulates  with difficulty?  Uses a walker to ambulate, feels weaker  Recent falls?  Endorsed, when reaching for something  Any head injuries?  Patient denies   History of seizures?   Patient denies   Wandering behavior?  Patient denies . She is afraid to go out Patient drives?   Patient no longer drives  Any mood changes since last visit?  Patient denies   Any worsening depression?:  Patient denies   Hallucinations?  Patient denies   Paranoia?  Patient denies   Patient reports that sleeps well without vivid dreams, REM behavior or sleepwalking   History of sleep apnea?  Patient denies   Any hygiene concerns? She takes bird baths  Independent of bathing and dressing?  Endorsed  Does  the patient needs help with medications?  Denies Who is in charge of the finances? Patient  is in charge, bills on autopay   Any changes in appetite?  Patient denies   Patient have trouble swallowing? Patient denies   Does the patient cook?  Patient denies   Any kitchen accidents such as leaving the stove on? Patient denies   Any headaches?  Patient denies   Double vision? Patient denies   Any focal numbness or tingling?  Patient denies   Chronic back pain Patient denies   Unilateral weakness?  Patient denies   Any tremors?  Patient  denies   Any history of anosmia?  Patient denies   Any incontinence of urine? Stress incontinence, recently had urgency, was initially that diagnosed with acute cystitis with hematuria, was found to have E. coli UTI as of today.  She is to follow-up with her PCP.  Wears pads  Any bowel dysfunction?  Normal bowel movements Patient lives with: Patient lives alone, her daughter monitors.   History on Initial Assessment 11/15/2018: This is a pleasant 76 year old right-handed woman with a history of hypertension, diabetes, breast cancer s/p mastectomy, presenting for evaluation of memory loss. Memory concerns were raised when she was admitted for altered mental status last 08/26/18. Records from her hospitalization were reviewed, she recalled speaking to family on the phone then waking up to EMS and family around her. Glucose level was 51. She was treated for hypoglycemia and noted to be alert, oriented and conversant the next day. She was unable to tell physicians what times of the day she takes her insulin or what dose, then it is noted that she revealed she had been adjusting her insulin dose at home on her own without notifying her PCP. Because of her poor memory, she was not able to tell MD how many units she was administering, but she reported that the low CBG readings caused her to reduce her insulin dosing. Dosing was changed in the hospital and patient was referred for Neurological evaluation of memory loss.    She and her daughter [name] both report today that she was taking her insulin as prescribed and she was not missing any doses or changing it, they state "the doctor had her on too much insulin and the dose has been changed." She and [name] report she is very organized with how she takes her medications and writes things down. [name] did report that she would not snack before bedtime, which made her sugar levels go down, but she is better with meals now. She feels her memory is good. [name] started  noticing minor memory issues for the past 10 years, worse the past 2 years. Her husband passed away in Aug 01, 2018, then she lost her 66 year old cat, then had to move in with her sister 3 months ago. She recognizes that this was a lot, "I know it's depression." She denies missing bill payments or leaving the stove on. Family was uncomfortable with her driving after the incident in October, she stopped driving when she moved in with her sister, denied getting lost previously, but states that if she drove now she may forget that she lives with her sister and drive to their old home. [name] states she repeats herself several times. [name] also notes increased moodiness and irritability, no paranoia or hallucinations. [name] reports she was taking Benadryl BID and was sleeping a lot, this has improved with discontinuation of Benadryl. Sleep now is pretty good, no wandering  behavior.She is independent with dressing and bathing.   She denies any headaches, dizziness, diplopia, dysarthria/dysphagia, neck/back pain, focal numbness/tingling/weakness, bowel dysfunction, anosmia or tremors. She denies any urinary incontinence but wears pads. She denies any family history of dementia, no alcohol use. She recalls a bowl hitting her head at work several years ago, no significant head injuries.    I personally reviewed MRI brain with and without contrast done 09/2018 which did not show any acute changes. There was mild diffuse volume loss, moderate chronic microvascular disease. PREVIOUS MEDICATIONS:   CURRENT MEDICATIONS:  Outpatient Encounter Medications as of 08/03/2022  Medication Sig   aspirin 81 MG tablet Take 81 mg by mouth daily.   benazepril (LOTENSIN) 40 MG tablet Take 1 tablet (40 mg total) by mouth daily.   cholecalciferol (VITAMIN D3) 25 MCG (1000 UNIT) tablet Take 1,000 Units by mouth daily.   donepezil (ARICEPT) 10 MG tablet Take half tablet (5 mg) daily for 2 weeks, then increase to the full tablet  at 10 mg daily   glucose blood (ONETOUCH ULTRA) test strip CHECK BLOOD SUGAR 2 TIMES A DAY Dx E11.9   Insulin Pen Needle (PEN NEEDLES) 31G X 5 MM MISC Use to give insulin daily Dx E11.9   isosorbide mononitrate (IMDUR) 60 MG 24 hr tablet Take 1 tablet (60 mg total) by mouth daily.   levothyroxine (SYNTHROID) 150 MCG tablet Take 1 tablet (150 mcg total) by mouth daily.   simvastatin (ZOCOR) 10 MG tablet Take 1 tablet (10 mg total) by mouth daily.   sulfamethoxazole-trimethoprim (BACTRIM DS) 800-160 MG tablet Take 1 tablet by mouth 2 (two) times daily for 7 days.   vitamin B-12 (CYANOCOBALAMIN) 500 MCG tablet Take 500 mcg by mouth daily.   vitamin E 200 UNIT capsule Take 200 Units by mouth daily.   No facility-administered encounter medications on file as of 08/03/2022.       08/03/2022    3:00 PM 07/01/2021   12:33 PM 11/15/2018    9:00 AM  MMSE - Mini Mental State Exam  Orientation to time '3 3 4  '$ Orientation to Place '2 3 5  '$ Registration '3 3 3  '$ Attention/ Calculation '1 4 4  '$ Recall 0 2 0  Language- name 2 objects '2 2 2  '$ Language- repeat '1 1 1  '$ Language- follow 3 step command '2 2 3  '$ Language- read & follow direction '1 1 1  '$ Write a sentence 0 1 1  Copy design 0 1 1  Total score '15 23 25      '$ 03/09/2022   10:00 AM 06/04/2019    2:00 PM  Montreal Cognitive Assessment   Visuospatial/ Executive (0/5) 2   Naming (0/3) 2   Attention: Read list of digits (0/2) 2 1  Attention: Read list of letters (0/1) 0 1  Attention: Serial 7 subtraction starting at 100 (0/3) 0 0  Language: Repeat phrase (0/2) 1 0  Language : Fluency (0/1) 0 1  Abstraction (0/2) 2 2  Delayed Recall (0/5) 0 0  Orientation (0/6) 4 5  Total 13   Adjusted Score (based on education) 14     Objective:     PHYSICAL EXAMINATION:    VITALS:   Vitals:   08/03/22 1259  BP: 137/86  Pulse: 61  Resp: 18  SpO2: 96%  Weight: 128 lb (58.1 kg)  Height: '5\' 4"'$  (1.626 m)    GEN:  The patient appears stated age and is in  NAD. HEENT:  Normocephalic, atraumatic.  Neurological examination:  General: NAD, well-groomed, appears stated age. Orientation: The patient is alert. Oriented to person, place and not to date Cranial nerves: There is good facial symmetry.The speech is fluent and clear. No aphasia or dysarthria. Fund of knowledge is appropriate. Recent and remote memory are impaired. Attention and concentration are reduced.  Able to name objects and repeat phrases.  Hearing is intact to conversational tone.    Sensation: Sensation is intact to light touch throughout Motor: Strength is at least antigravity x4. Tremors: none  DTR's 2/4 in UE/LE     Movement examination: Tone: There is normal tone in the UE/LE Abnormal movements:  no tremor.  No myoclonus.  No asterixis.   Coordination:  There is no decremation with RAM's. Normal finger to nose  Gait and Station: patient is on a wheelchair, gait not tested.    Thank you for allowing [place] the opportunity to participate in the care of this nice patient. Please do not hesitate to contact us for any questions or concerns.   Total time spent on today's visit was 42 minutes dedicated to this patient today, preparing to see patient, examining the patient, ordering tests and/or medications and counseling the patient, documenting clinical information in the EHR or other health record, independently interpreting results and communicating results to the patient/family, discussing treatment and goals, answering patient's questions and coordinating care.  Cc:  [name], MD  Sharene Butters 08/03/2022 3:18 PM

## 2022-08-10 DIAGNOSIS — I10 Essential (primary) hypertension: Secondary | ICD-10-CM | POA: Diagnosis not present

## 2022-08-10 DIAGNOSIS — E559 Vitamin D deficiency, unspecified: Secondary | ICD-10-CM | POA: Diagnosis not present

## 2022-08-10 DIAGNOSIS — E1165 Type 2 diabetes mellitus with hyperglycemia: Secondary | ICD-10-CM | POA: Diagnosis not present

## 2022-08-10 DIAGNOSIS — G309 Alzheimer's disease, unspecified: Secondary | ICD-10-CM | POA: Diagnosis not present

## 2022-08-10 DIAGNOSIS — E7800 Pure hypercholesterolemia, unspecified: Secondary | ICD-10-CM | POA: Diagnosis not present

## 2022-08-10 DIAGNOSIS — E039 Hypothyroidism, unspecified: Secondary | ICD-10-CM | POA: Diagnosis not present

## 2022-08-11 ENCOUNTER — Encounter (HOSPITAL_COMMUNITY): Payer: Self-pay | Admitting: Emergency Medicine

## 2022-08-11 ENCOUNTER — Other Ambulatory Visit: Payer: Self-pay

## 2022-08-11 ENCOUNTER — Inpatient Hospital Stay (HOSPITAL_COMMUNITY)
Admission: EM | Admit: 2022-08-11 | Discharge: 2022-08-15 | DRG: 689 | Disposition: A | Discharge status: Skilled Nursing Facility | Payer: Medicare Other | Attending: Family Medicine | Admitting: Family Medicine

## 2022-08-11 ENCOUNTER — Emergency Department (HOSPITAL_COMMUNITY): Payer: Medicare Other

## 2022-08-11 DIAGNOSIS — Z809 Family history of malignant neoplasm, unspecified: Secondary | ICD-10-CM

## 2022-08-11 DIAGNOSIS — Z91148 Patient's other noncompliance with medication regimen for other reason: Secondary | ICD-10-CM | POA: Diagnosis not present

## 2022-08-11 DIAGNOSIS — I2510 Atherosclerotic heart disease of native coronary artery without angina pectoris: Secondary | ICD-10-CM | POA: Diagnosis not present

## 2022-08-11 DIAGNOSIS — E559 Vitamin D deficiency, unspecified: Secondary | ICD-10-CM | POA: Diagnosis not present

## 2022-08-11 DIAGNOSIS — Z90710 Acquired absence of both cervix and uterus: Secondary | ICD-10-CM

## 2022-08-11 DIAGNOSIS — R0989 Other specified symptoms and signs involving the circulatory and respiratory systems: Secondary | ICD-10-CM | POA: Diagnosis not present

## 2022-08-11 DIAGNOSIS — B952 Enterococcus as the cause of diseases classified elsewhere: Secondary | ICD-10-CM | POA: Diagnosis not present

## 2022-08-11 DIAGNOSIS — Z794 Long term (current) use of insulin: Secondary | ICD-10-CM

## 2022-08-11 DIAGNOSIS — Z9010 Acquired absence of unspecified breast and nipple: Secondary | ICD-10-CM | POA: Diagnosis not present

## 2022-08-11 DIAGNOSIS — E02 Subclinical iodine-deficiency hypothyroidism: Secondary | ICD-10-CM

## 2022-08-11 DIAGNOSIS — I10 Essential (primary) hypertension: Secondary | ICD-10-CM | POA: Diagnosis present

## 2022-08-11 DIAGNOSIS — Z7982 Long term (current) use of aspirin: Secondary | ICD-10-CM

## 2022-08-11 DIAGNOSIS — Z9049 Acquired absence of other specified parts of digestive tract: Secondary | ICD-10-CM | POA: Diagnosis not present

## 2022-08-11 DIAGNOSIS — Z9012 Acquired absence of left breast and nipple: Secondary | ICD-10-CM

## 2022-08-11 DIAGNOSIS — R001 Bradycardia, unspecified: Secondary | ICD-10-CM | POA: Diagnosis not present

## 2022-08-11 DIAGNOSIS — R2681 Unsteadiness on feet: Secondary | ICD-10-CM | POA: Diagnosis not present

## 2022-08-11 DIAGNOSIS — Z79890 Hormone replacement therapy (postmenopausal): Secondary | ICD-10-CM

## 2022-08-11 DIAGNOSIS — N390 Urinary tract infection, site not specified: Secondary | ICD-10-CM | POA: Diagnosis not present

## 2022-08-11 DIAGNOSIS — N3000 Acute cystitis without hematuria: Secondary | ICD-10-CM

## 2022-08-11 DIAGNOSIS — E039 Hypothyroidism, unspecified: Secondary | ICD-10-CM | POA: Diagnosis not present

## 2022-08-11 DIAGNOSIS — N3001 Acute cystitis with hematuria: Secondary | ICD-10-CM | POA: Diagnosis not present

## 2022-08-11 DIAGNOSIS — E119 Type 2 diabetes mellitus without complications: Secondary | ICD-10-CM | POA: Diagnosis present

## 2022-08-11 DIAGNOSIS — Z8249 Family history of ischemic heart disease and other diseases of the circulatory system: Secondary | ICD-10-CM

## 2022-08-11 DIAGNOSIS — Z79899 Other long term (current) drug therapy: Secondary | ICD-10-CM | POA: Diagnosis not present

## 2022-08-11 DIAGNOSIS — I252 Old myocardial infarction: Secondary | ICD-10-CM | POA: Diagnosis not present

## 2022-08-11 DIAGNOSIS — Z853 Personal history of malignant neoplasm of breast: Secondary | ICD-10-CM | POA: Diagnosis not present

## 2022-08-11 DIAGNOSIS — F32A Depression, unspecified: Secondary | ICD-10-CM | POA: Diagnosis not present

## 2022-08-11 DIAGNOSIS — B9620 Unspecified Escherichia coli [E. coli] as the cause of diseases classified elsewhere: Secondary | ICD-10-CM | POA: Diagnosis present

## 2022-08-11 DIAGNOSIS — R4182 Altered mental status, unspecified: Principal | ICD-10-CM

## 2022-08-11 DIAGNOSIS — F0390 Unspecified dementia without behavioral disturbance: Secondary | ICD-10-CM | POA: Diagnosis present

## 2022-08-11 DIAGNOSIS — Z8673 Personal history of transient ischemic attack (TIA), and cerebral infarction without residual deficits: Secondary | ICD-10-CM

## 2022-08-11 DIAGNOSIS — E785 Hyperlipidemia, unspecified: Secondary | ICD-10-CM | POA: Diagnosis not present

## 2022-08-11 DIAGNOSIS — G9341 Metabolic encephalopathy: Secondary | ICD-10-CM | POA: Diagnosis not present

## 2022-08-11 DIAGNOSIS — Z741 Need for assistance with personal care: Secondary | ICD-10-CM | POA: Diagnosis not present

## 2022-08-11 DIAGNOSIS — M6281 Muscle weakness (generalized): Secondary | ICD-10-CM | POA: Diagnosis not present

## 2022-08-11 DIAGNOSIS — T381X6A Underdosing of thyroid hormones and substitutes, initial encounter: Secondary | ICD-10-CM | POA: Diagnosis present

## 2022-08-11 LAB — COMPREHENSIVE METABOLIC PANEL
ALT: 30 U/L (ref 0–44)
AST: 35 U/L (ref 15–41)
Albumin: 3.8 g/dL (ref 3.5–5.0)
Alkaline Phosphatase: 99 U/L (ref 38–126)
Anion gap: 9 (ref 5–15)
BUN: 25 mg/dL — ABNORMAL HIGH (ref 8–23)
CO2: 26 mmol/L (ref 22–32)
Calcium: 9.3 mg/dL (ref 8.9–10.3)
Chloride: 102 mmol/L (ref 98–111)
Creatinine, Ser: 1.39 mg/dL — ABNORMAL HIGH (ref 0.44–1.00)
GFR, Estimated: 40 mL/min — ABNORMAL LOW (ref >60)
Glucose, Bld: 164 mg/dL — ABNORMAL HIGH (ref 70–99)
Potassium: 3.4 mmol/L — ABNORMAL LOW (ref 3.5–5.1)
Sodium: 137 mmol/L (ref 135–145)
Total Bilirubin: 1.2 mg/dL (ref 0.3–1.2)
Total Protein: 7.4 g/dL (ref 6.5–8.1)

## 2022-08-11 LAB — URINALYSIS, ROUTINE W REFLEX MICROSCOPIC
Bilirubin Urine: NEGATIVE
Glucose, UA: NEGATIVE mg/dL
Ketones, ur: 5 mg/dL — AB
Nitrite: NEGATIVE
Protein, ur: 30 mg/dL — AB
Specific Gravity, Urine: 1.017 (ref 1.005–1.030)
WBC, UA: >50 WBC/hpf — ABNORMAL HIGH (ref 0–5)
pH: 5 (ref 5.0–8.0)

## 2022-08-11 LAB — CBC
HCT: 39.2 % (ref 36.0–46.0)
Hemoglobin: 13.2 g/dL (ref 12.0–15.0)
MCH: 30.1 pg (ref 26.0–34.0)
MCHC: 33.7 g/dL (ref 30.0–36.0)
MCV: 89.3 fL (ref 80.0–100.0)
Platelets: 300 10*3/uL (ref 150–400)
RBC: 4.39 MIL/uL (ref 3.87–5.11)
RDW: 13.8 % (ref 11.5–15.5)
WBC: 9.3 10*3/uL (ref 4.0–10.5)
nRBC: 0 % (ref 0.0–0.2)

## 2022-08-11 LAB — LACTIC ACID, PLASMA: Lactic Acid, Venous: 1.1 mmol/L (ref 0.5–1.9)

## 2022-08-11 LAB — TSH: TSH: 47.904 u[IU]/mL — ABNORMAL HIGH (ref 0.350–4.500)

## 2022-08-11 MED ORDER — BENAZEPRIL HCL 20 MG PO TABS
40.0000 mg | ORAL_TABLET | Freq: Every day | ORAL | Status: DC
  Administered 2022-08-12 – 2022-08-15 (×4): 40 mg via ORAL
  Filled 2022-08-11 (×4): qty 2

## 2022-08-11 MED ORDER — VITAMIN E 45 MG (100 UNIT) PO CAPS
200.0000 [IU] | ORAL_CAPSULE | Freq: Every day | ORAL | Status: DC
  Administered 2022-08-12 – 2022-08-15 (×4): 200 [IU] via ORAL
  Filled 2022-08-11 (×5): qty 2

## 2022-08-11 MED ORDER — ONDANSETRON HCL 4 MG/2ML IJ SOLN: 4.0000 mg | Freq: Four times a day (QID) | INTRAMUSCULAR | Status: DC | PRN

## 2022-08-11 MED ORDER — ACETAMINOPHEN 650 MG RE SUPP: 650.0000 mg | Freq: Four times a day (QID) | RECTAL | Status: DC | PRN

## 2022-08-11 MED ORDER — HEPARIN SODIUM (PORCINE) 5000 UNIT/ML IJ SOLN
5000.0000 [IU] | Freq: Three times a day (TID) | INTRAMUSCULAR | Status: DC
  Administered 2022-08-11 – 2022-08-15 (×11): 5000 [IU] via SUBCUTANEOUS
  Filled 2022-08-11 (×8): qty 1

## 2022-08-11 MED ORDER — ISOSORBIDE MONONITRATE ER 60 MG PO TB24
60.0000 mg | ORAL_TABLET | Freq: Every day | ORAL | Status: DC
  Administered 2022-08-12 – 2022-08-15 (×4): 60 mg via ORAL
  Filled 2022-08-11 (×4): qty 1

## 2022-08-11 MED ORDER — LEVOTHYROXINE SODIUM 50 MCG PO TABS
150.0000 ug | ORAL_TABLET | Freq: Every day | ORAL | Status: DC
  Administered 2022-08-12 – 2022-08-15 (×4): 150 ug via ORAL
  Filled 2022-08-11 (×4): qty 3

## 2022-08-11 MED ORDER — ONDANSETRON HCL 4 MG PO TABS: 4.0000 mg | ORAL_TABLET | Freq: Four times a day (QID) | ORAL | Status: DC | PRN

## 2022-08-11 MED ORDER — DONEPEZIL HCL 5 MG PO TABS
10.0000 mg | ORAL_TABLET | Freq: Every day | ORAL | Status: DC
  Administered 2022-08-12 – 2022-08-15 (×4): 10 mg via ORAL
  Filled 2022-08-11 (×4): qty 2

## 2022-08-11 MED ORDER — SIMVASTATIN 10 MG PO TABS
10.0000 mg | ORAL_TABLET | Freq: Every day | ORAL | Status: DC
  Administered 2022-08-12 – 2022-08-15 (×4): 10 mg via ORAL
  Filled 2022-08-11 (×4): qty 1

## 2022-08-11 MED ORDER — ACETAMINOPHEN 325 MG PO TABS: 650.0000 mg | ORAL_TABLET | Freq: Four times a day (QID) | ORAL | Status: DC | PRN

## 2022-08-11 MED ORDER — ASPIRIN 81 MG PO TBEC
81.0000 mg | DELAYED_RELEASE_TABLET | Freq: Every day | ORAL | Status: DC
  Administered 2022-08-12 – 2022-08-15 (×4): 81 mg via ORAL
  Filled 2022-08-11 (×4): qty 1

## 2022-08-11 MED ORDER — VITAMIN D 25 MCG (1000 UNIT) PO TABS
1000.0000 [IU] | ORAL_TABLET | Freq: Every day | ORAL | Status: DC
  Administered 2022-08-12 – 2022-08-15 (×4): 1000 [IU] via ORAL
  Filled 2022-08-11 (×4): qty 1

## 2022-08-11 MED ORDER — SODIUM CHLORIDE 0.9 % IV SOLN
1.0000 g | INTRAVENOUS | Status: DC
  Administered 2022-08-11 – 2022-08-13 (×3): 1 g via INTRAVENOUS
  Filled 2022-08-11 (×3): qty 10

## 2022-08-11 MED ORDER — ORAL CARE MOUTH RINSE: 15.0000 mL | OROMUCOSAL | Status: DC | PRN

## 2022-08-11 NOTE — ED Notes (Signed)
Daughter of pt reports "her thyroid is off and she probably still has a UTI". Pt was treated recently with abx for UTI, pt has dementia per daughter that has "gotten worse" and daughter reports pt lives at home alone and needs services she is unable to provide. Daughter reports pt has not been taking her meds at home and this has "messed up her thyroid". Pt reports she does not know why she is here, but answers all questions appropriately, stating she keeps her house up and clean.

## 2022-08-11 NOTE — Plan of Care (Signed)

## 2022-08-11 NOTE — H&P (Signed)
TRH H&P   Patient Demographics:    Brianna Weber, is a 76 y.o. female  MRN: [id number]   DOB - November 09, 1946  Admit Date - 08/11/2022  Outpatient Primary MD for the patient is Jacelyn Pi, MD  Referring MD/NP/PA: PA Ileene Patrick  Patient coming from: Home  Chief Complaint  Patient presents with   Altered Mental Status      HPI:    Brianna Weber  is a 76 y.o. female, with history of bradycardia, breast cancer status post mastectomy, diabetes mellitus, hyperlipidemia, hypertension, coronary disease, hypothyroidism , was brought by her daughter due to confusion, patient with baseline dementia, but she is able to live by herself, per her report mother has been more confused recently, she called 911 few days ago as she thought her home was flooding, patient was diagnosed with UTI 915/2023, she was given Keflex, but it is unclear how compliant patient with her medication as then she was noted to have hypothyroidism with TSH of 270 due to noncompliance with medication, daughter has been calling her mother recently on a daily basis to remind her to take her meds. Patient can ambulate with a cane, able to take care of himself at home, but daughter noted that patient did not leave her couch, she did soil herself, and she has not been eating or drinking or taking her medications. -ED her work-up was significant for elevated TSH at 47 (down from 270), still having positive urine analysis (most recent UA 9/15 growing E. coli), her potassium was low at 3.4, creatinine at 1.39, UA still significant for pyuria, chest x-ray with no acute findings, hospitalist consulted to admit   Review of systems:      A full 10 point Review of Systems was done, except as stated above, all other Review of Systems were negative.   With Past History of the following :    Past Medical History:  Diagnosis Date    Allergy    Arrhythmia    bradycardia   Breast cancer ([place])    s/p left mastectomy   Diabetes mellitus    Diverticulosis of colon    Hyperlipidemia    Hypertension    NSTEMI (non-ST elevated myocardial infarction) ([place])    2004 secondary to stress induced cardiomyopathy. Normal LV function by repeatr echo.   Peripheral edema    Thyroid disease    hypothyroidism   Vitamin D deficiency       Past Surgical History:  Procedure Laterality Date   ABDOMINAL HYSTERECTOMY     CHOLECYSTECTOMY     MASTECTOMY     left      Social History:     Social History   Tobacco Use   Smoking status: Never   Smokeless tobacco: Never  Substance Use Topics   Alcohol use: No      Family History :     Family History  Problem Relation Age of Onset   Hypertension Mother    Heart disease Mother    Heart attack Father    Heart disease Sister    Heart disease Sister    Heart disease Sister    Diabetes Daughter    Cancer Maternal Aunt        breast    Heart disease Maternal Aunt    Hyperlipidemia Maternal Aunt    Hypertension Maternal Aunt    Heart disease Maternal Uncle    Hyperlipidemia Maternal Uncle    Hypertension Maternal Uncle    Heart disease Maternal Grandmother    Heart disease Brother    Heart disease Brother    Hypertension Other    Breast cancer Neg Hx       Home Medications:   Prior to Admission medications   Medication Sig Start Date End Date Taking? Authorizing Provider  aspirin 81 MG tablet Take 81 mg by mouth daily.   Yes [provider]  benazepril (LOTENSIN) 40 MG tablet Take 1 tablet (40 mg total) by mouth daily. 03/08/22  Yes [name], [name], MD  cholecalciferol (VITAMIN D3) 25 MCG (1000 UNIT) tablet Take 1,000 Units by mouth daily.   Yes [provider]  donepezil (ARICEPT) 10 MG tablet Take half tablet (5 mg) daily for 2 weeks, then increase to the full tablet at 10 mg daily Patient taking differently: Take 10 mg by mouth daily.  03/09/22  Yes Rondel Jumbo, PA-C  isosorbide mononitrate (IMDUR) 60 MG 24 hr tablet Take 1 tablet (60 mg total) by mouth daily. 10/03/21  Yes [name], MD  levothyroxine (SYNTHROID) 150 MCG tablet Take 1 tablet (150 mcg total) by mouth daily. 01/10/22  Yes [name], [name], MD  simvastatin (ZOCOR) 10 MG tablet Take 1 tablet (10 mg total) by mouth daily. 10/03/21  Yes [name], [name], MD  vitamin B-12 (CYANOCOBALAMIN) 500 MCG tablet Take 500 mcg by mouth daily.   Yes [provider]  vitamin E 200 UNIT capsule Take 200 Units by mouth daily.   Yes [provider]  glucose blood (ONETOUCH ULTRA) test strip CHECK BLOOD SUGAR 2 TIMES A DAY Dx E11.9 10/03/21   [name], MD  Insulin Pen Needle (PEN NEEDLES) 31G X 5 MM MISC Use to give insulin daily Dx E11.9 10/03/21   [name], MD     Allergies:    No Known Allergies   Physical Exam:   Vitals  Blood pressure (!) 164/64, pulse (!) 55, temperature 98 F (36.7 C), temperature source Oral, resp. rate 16, SpO2 97 %.   1. General elderly female, laying in bed, no apparent distress  2.  Mentation much improved, she is oriented to self, place, to year but not.    3. No F.N deficits, ALL C.Nerves Intact, Strength 5/5 all 4 extremities, Sensation intact all 4 extremities, Plantars down going.  4. Ears and Eyes appear Normal, Conjunctivae clear, PERRLA. Moist Oral Mucosa.  5. Supple Neck, No JVD, No cervical lymphadenopathy appriciated, No Carotid Bruits.  6. Symmetrical Chest wall movement, Good air movement bilaterally, CTAB.  7. RRR, No Gallops, Rubs or Murmurs, No Parasternal Heave.  8. Positive Bowel Sounds, Abdomen Soft, No tenderness, No organomegaly appriciated,No rebound -guarding or rigidity.  9.  No Cyanosis, Normal Skin Turgor, No Skin Rash or Bruise.  10. Good muscle tone,  joints appear normal , no effusions, Normal ROM.    Data Review:    CBC Recent Labs  Lab 08/11/22 1253  WBC 9.3  HGB  13.2  HCT 39.2  PLT 300  MCV 89.3  MCH 30.1  MCHC 33.7  RDW 13.8   ------------------------------------------------------------------------------------------------------------------  Chemistries  Recent Labs  Lab 08/11/22 1253  NA 137  K 3.4*  CL 102  CO2 26  GLUCOSE 164*  BUN 25*  CREATININE 1.39*  CALCIUM 9.3  AST 35  ALT 30  ALKPHOS 99  BILITOT 1.2   ------------------------------------------------------------------------------------------------------------------ estimated creatinine clearance is 30.7 mL/min (A) (by C-G formula based on SCr of 1.39 mg/dL (H)). ------------------------------------------------------------------------------------------------------------------ Recent Labs    08/11/22 1253  TSH 47.904*    Coagulation profile No results for input(s): "INR", "PROTIME" in the last 168 hours. ------------------------------------------------------------------------------------------------------------------- No results for input(s): "DDIMER" in the last 72 hours. -------------------------------------------------------------------------------------------------------------------  Cardiac Enzymes No results for input(s): "CKMB", "TROPONINI", "MYOGLOBIN" in the last 168 hours.  Invalid input(s): "CK" ------------------------------------------------------------------------------------------------------------------ No results found for: "BNP"   ---------------------------------------------------------------------------------------------------------------  Urinalysis    Component Value Date/Time   COLORURINE YELLOW 08/11/2022 1457   APPEARANCEUR CLOUDY (A) 08/11/2022 1457   APPEARANCEUR Cloudy (A) 07/28/2022 1004   LABSPEC 1.017 08/11/2022 1457   PHURINE 5.0 08/11/2022 1457   GLUCOSEU NEGATIVE 08/11/2022 1457   HGBUR SMALL (A) 08/11/2022 1457   BILIRUBINUR NEGATIVE 08/11/2022 1457   BILIRUBINUR Negative 07/28/2022 1004   KETONESUR 5 (A) 08/11/2022  1457   PROTEINUR 30 (A) 08/11/2022 1457   NITRITE NEGATIVE 08/11/2022 1457   LEUKOCYTESUR LARGE (A) 08/11/2022 1457    ----------------------------------------------------------------------------------------------------------------   Imaging Results:    DG Chest Portable 1 View  Result Date: 08/11/2022 CLINICAL DATA:  Bibasilar rales. EXAM: PORTABLE CHEST 1 VIEW COMPARISON:  May 04, 2006. FINDINGS: The heart size and mediastinal contours are within normal limits. Both lungs are clear. The visualized skeletal structures are unremarkable. IMPRESSION: No active disease. Electronically Signed   By: Marijo Conception M.D.   On: 08/11/2022 14:50    My personal review of EKG: Rhythm Sinus bradycardia, Rate  52/min, QTc 524    Assessment & Plan:    Principal Problem:   UTI (urinary tract infection) Active Problems:   Hypothyroidism   Essential hypertension   Bradycardia   Type 2 diabetes mellitus (HCC)   AMS (altered mental status)   Acute metabolic encephalopathy Conditioning -Secondary to infection from UTI -As well most likely due to poor compliance with her thyroid medication -Patient significantly improved, close to baseline currently. -Consult PT/OT, likely will need subacute rehab -B12 level during recent PCP visit was elevated at 1600, will hold supplements for now  Hypothyroidism -TSH remains significantly elevated, but much improved, likely she is started to take her Synthroid meds, continue with current dose with no  changes   Diabetes mellitus type 2 Appears to become controlled with diet, need to monitor CBG closely   Essential hypertension Continue with home medications   Hyperlipidemia -Continue with simvastatin   Coronary artery disease -Continue aspirin and Imdur   Dementia -Continue with Aricept  Sinus bradycardia -Heart rate in the 50s, appears symptomatic, she is not on any AV blocking agents, continue to monitor -TC mildly prolonged at 524, avoid  prolonging agents  DVT Prophylaxis Heparin  AM Labs Ordered, also please review Full Orders  Family Communication: Admission, patients condition and plan of care including tests being ordered have been discussed with the patient and daughter  who indicate understanding and agree with the plan and Code Status.  Code Status full code  Likely DC to likely will need subacute rehab  Condition GUARDED  Consults called: None  Admission status: Observation  Time spent in minutes : 55 minutes   Phillips Climes M.D on 08/11/2022 at 5:18 PM   [facility] - Office  [phone]

## 2022-08-11 NOTE — ED Notes (Signed)
Pt placed on purewick because of limited mobility. Pt is aware of needing sample. Informed pt if she wasn't able to urinate this nurse tech and RN could do in and out cath. Pt stated "Id like to try before that and wait just a little longer." Nurse notified.

## 2022-08-11 NOTE — ED Triage Notes (Signed)
Pt BIB for AMS x several months and abnormal thyroid labs that were draw at PCP

## 2022-08-11 NOTE — ED Provider Notes (Signed)
[facility] EMERGENCY DEPARTMENT Provider Note   CSN: [id number] Arrival date & time: 08/11/22  1126     History  Chief Complaint  Patient presents with   Altered Mental Status    Brianna Weber is a 76 y.o. female.  HPI   Medical history including hypothyroidism, breast cancer, stroke 2004, dementia presents emerged part with complaints of confusion.  Patient is a poor historian but states she has no complaints, not nursing any recent falls headaches change in vision cough congestion chest pain shortness of breath stomach pains nausea vomiting diarrhea denies any urinary symptoms, states that she has been eating and drinking.  Patient's daughter is at bedside and states at baseline patient is altered  but is more altered than usual, states that she lives home alone, can ambulate with a cane, states that patient will not leave her couch and will soil herself, states that she has not been eating or drinking or taking her medications.  Daughter states that generally the house is very clean but when she visited her yesterday it was dirty which is abnormal for her.  Reviewed patient's chart has been seen by her primary care doctor as well as her neurologist, lab work was obtained as she was diagnosed with a UTI positive for E. coli, she was started on antibiotics unclear what antibiotic she was started on or whether or not she finished it, she also has notably elevated TSH at 270 T4 was 2.4, free T4 was 0.4, unclear again if she is taking her thyroid medications.   Home Medications Prior to Admission medications   Medication Sig Start Date End Date Taking? Authorizing Provider  aspirin 81 MG tablet Take 81 mg by mouth daily.    [provider]  benazepril (LOTENSIN) 40 MG tablet Take 1 tablet (40 mg total) by mouth daily. 03/08/22   [name], MD  cholecalciferol (VITAMIN D3) 25 MCG (1000 UNIT) tablet Take 1,000 Units by mouth daily.    [provider]   donepezil (ARICEPT) 10 MG tablet Take half tablet (5 mg) daily for 2 weeks, then increase to the full tablet at 10 mg daily 03/09/22   Shawn Route, Coralee Pesa, PA-C  glucose blood (ONETOUCH ULTRA) test strip CHECK BLOOD SUGAR 2 TIMES A DAY Dx E11.9 10/03/21   [name], MD  Insulin Pen Needle (PEN NEEDLES) 31G X 5 MM MISC Use to give insulin daily Dx E11.9 10/03/21   [name], MD  isosorbide mononitrate (IMDUR) 60 MG 24 hr tablet Take 1 tablet (60 mg total) by mouth daily. 10/03/21   [name], MD  levothyroxine (SYNTHROID) 150 MCG tablet Take 1 tablet (150 mcg total) by mouth daily. 01/10/22   [name], MD  simvastatin (ZOCOR) 10 MG tablet Take 1 tablet (10 mg total) by mouth daily. 10/03/21   [name], MD  vitamin B-12 (CYANOCOBALAMIN) 500 MCG tablet Take 500 mcg by mouth daily.    [provider]  vitamin E 200 UNIT capsule Take 200 Units by mouth daily.    [provider]      Allergies    Patient has no known allergies.    Review of Systems   Review of Systems  Unable to perform ROS: Mental status change    Physical Exam Updated Vital Signs BP (!) 164/64   Pulse (!) 55   Temp 98 F (36.7 C) (Oral)   Resp 16   SpO2 97%  Physical Exam Vitals and nursing note reviewed.  Constitutional:  General: She is not in acute distress.    Appearance: She is not ill-appearing.     Comments: Pleasant no acute distress, disheveled  HENT:     Head: Normocephalic and atraumatic.     Comments: There is no deformity the head present no raccoon eyes or battle sign noted.    Nose: No congestion.     Mouth/Throat:     Mouth: Mucous membranes are moist.     Pharynx: Oropharynx is clear.     Comments: No trismus no torticollis no oral trauma present. Eyes:     Extraocular Movements: Extraocular movements intact.     Conjunctiva/sclera: Conjunctivae normal.     Pupils: Pupils are equal, round, and reactive to light.  Cardiovascular:     Rate and  Rhythm: Regular rhythm. Bradycardia present.     Pulses: Normal pulses.     Heart sounds: No murmur heard.    No friction rub. No gallop.  Pulmonary:     Effort: No respiratory distress.     Breath sounds: No wheezing, rhonchi or rales.     Comments: Lung sounds clear bilaterally no evidence of respiratory distress Abdominal:     Palpations: Abdomen is soft.     Tenderness: There is no abdominal tenderness. There is no right CVA tenderness or left CVA tenderness.  Musculoskeletal:     Comments: Spine was palpated was nontender to palpation no step-off deformities noted no pelvis instability no leg shortening moving upper and lower extremities out difficulty.  Skin:    General: Skin is warm and dry.  Neurological:     Mental Status: She is alert.     Comments: Alert and oriented x3, cannot tell me the day, cranial nerves II through XII grossly intact no difficulty with word finding following two-step commands no unilateral weakness present.  Psychiatric:        Mood and Affect: Mood normal.     ED Results / Procedures / Treatments   Labs (all labs ordered are listed, but only abnormal results are displayed) Labs Reviewed  COMPREHENSIVE METABOLIC PANEL - Abnormal; Notable for the following components:      Result Value   Potassium 3.4 (*)    Glucose, Bld 164 (*)    BUN 25 (*)    Creatinine, Ser 1.39 (*)    GFR, Estimated 40 (*)    All other components within normal limits  TSH - Abnormal; Notable for the following components:   TSH 47.904 (*)    All other components within normal limits  URINALYSIS, ROUTINE W REFLEX MICROSCOPIC - Abnormal; Notable for the following components:   APPearance CLOUDY (*)    Hgb urine dipstick SMALL (*)    Ketones, ur 5 (*)    Protein, ur 30 (*)    Leukocytes,Ua LARGE (*)    WBC, UA >50 (*)    Bacteria, UA RARE (*)    Non Squamous Epithelial 0-5 (*)    All other components within normal limits  CULTURE, BLOOD (ROUTINE X 2)  CULTURE, BLOOD  (ROUTINE X 2)  URINE CULTURE  CBC  LACTIC ACID, PLASMA  CBG MONITORING, ED    EKG None  Radiology DG Chest Portable 1 View  Result Date: 08/11/2022 CLINICAL DATA:  Bibasilar rales. EXAM: PORTABLE CHEST 1 VIEW COMPARISON:  May 04, 2006. FINDINGS: The heart size and mediastinal contours are within normal limits. Both lungs are clear. The visualized skeletal structures are unremarkable. IMPRESSION: No active disease. Electronically Signed   By: Jeneen Rinks  Murlean Caller M.D.   On: 08/11/2022 14:50    Procedures Procedures    Medications Ordered in ED Medications  cefTRIAXone (ROCEPHIN) 1 g in sodium chloride 0.9 % 100 mL IVPB (has no administration in time range)    ED Course/ Medical Decision Making/ A&P                           Medical Decision Making Amount and/or Complexity of Data Reviewed Labs: ordered. Radiology: ordered.  Risk Decision regarding hospitalization.   This patient presents to the ED for concern of altered mental status, this involves an extensive number of treatment options, and is a complaint that carries with it a high risk of complications and morbidity.  The differential diagnosis includes sepsis, metabolic derailment, UTI, hypothyroidism, intracranial head bleed    Additional history obtained:  Additional history obtained from daughter at bedside External records from outside source obtained and reviewed including PCP notes, neurology notes   Co morbidities that complicate the patient evaluation  Dementia, hypothyroidism  Social Determinants of Health:  Geriatric    Lab Tests:  I Ordered, and personally interpreted labs.  The pertinent results include: CBC unremarkable, CMP shows potassium 3.4, glucose 164, BUN 25, creatinine 1.3 GFR 40, TSH is 47, lactic 1.1, UA consistent with UTI   Imaging Studies ordered:  I ordered imaging studies including chest x-ray I independently visualized and interpreted imaging which showed negative acute  findings I agree with the radiologist interpretation   Cardiac Monitoring:  The patient was maintained on a cardiac monitor.  I personally viewed and interpreted the cardiac monitored which showed an underlying rhythm of: Bradycardia without signs of ischemia   Medicines ordered and prescription drug management:  I ordered medication including N/A I have reviewed the patients home medicines and have made adjustments as needed  Critical Interventions:  N/A   Reevaluation:  Presents slightly altered during my exam no focal deficits, suspect underlying UTI versus hypothyroidism will obtain lab work and reassess.  TSH has improved UA is consistent UTI, suspect between hypothyroidism as well as UTI likely cause of her altered mental status, will recommend admission patient as well as daughter is in agreement with this plan will consult hospitalist team.    Consultations Obtained:  I requested consultation with the hospitalist Dr. [facility] ,  and discussed lab and imaging findings as well as pertinent plan - they recommend: will admit the patient    Test Considered:  CT head-deferred my suspicion for intracranial head bleed is low at this time no recent head trauma, not anticoag's, she has no focal deficits present my exam.    Rule out low suspicion for CVA or intracranial head bleed as patient denies change in vision, paresthesias or weakness to upper lower extremities, no neuro deficits noted on exam.  Low suspicion for ACS or arrhythmias as patient denies chest pain, shortness of breath, no hypoperfusion or fluid overload on exam, EKG sinus without signs of ischemia.  Low suspicion for pneumonia lung sounds are clear bilaterally chest heart is unremarkable.  I doubt hepatic encephalopathy no history of cirrhosis, there is no evidence of jaundice on my exam there were signs are unremarkable.  I do not pyelo or kidney stone does not endorse any flank tenderness, she is  nontoxic-appearing, vital signs are reassuring.  Low suspicion for systemic infection as patient is nontoxic-appearing, vital signs reassuring, no obvious source infection noted on exam.  Dispostion and problem list  After consideration of the diagnostic results and the patients response to treatment, I feel that the patent would benefit from admission.  Altered mental status-suspect this is multifactorial, UTI, cultures positive for E. coli susceptible to ceftriaxone will provide with a gram of ceftriaxone, as well as hypothyroidism, will need further observation.            Final Clinical Impression(s) / ED Diagnoses Final diagnoses:  Altered mental status, unspecified altered mental status type  Acute cystitis with hematuria  Hypothyroidism, unspecified type    Rx / DC Orders ED Discharge Orders     None         Aron Baba 08/11/22 1931    Teressa Lower, MD 08/13/22 1143

## 2022-08-12 DIAGNOSIS — Z794 Long term (current) use of insulin: Secondary | ICD-10-CM | POA: Diagnosis not present

## 2022-08-12 DIAGNOSIS — Z9012 Acquired absence of left breast and nipple: Secondary | ICD-10-CM | POA: Diagnosis not present

## 2022-08-12 DIAGNOSIS — E785 Hyperlipidemia, unspecified: Secondary | ICD-10-CM | POA: Diagnosis present

## 2022-08-12 DIAGNOSIS — Z853 Personal history of malignant neoplasm of breast: Secondary | ICD-10-CM | POA: Diagnosis not present

## 2022-08-12 DIAGNOSIS — E559 Vitamin D deficiency, unspecified: Secondary | ICD-10-CM | POA: Diagnosis present

## 2022-08-12 DIAGNOSIS — I10 Essential (primary) hypertension: Secondary | ICD-10-CM | POA: Diagnosis present

## 2022-08-12 DIAGNOSIS — B9620 Unspecified Escherichia coli [E. coli] as the cause of diseases classified elsewhere: Secondary | ICD-10-CM | POA: Diagnosis present

## 2022-08-12 DIAGNOSIS — R001 Bradycardia, unspecified: Secondary | ICD-10-CM | POA: Diagnosis present

## 2022-08-12 DIAGNOSIS — N3001 Acute cystitis with hematuria: Secondary | ICD-10-CM | POA: Diagnosis present

## 2022-08-12 DIAGNOSIS — E119 Type 2 diabetes mellitus without complications: Secondary | ICD-10-CM | POA: Diagnosis present

## 2022-08-12 DIAGNOSIS — Z8673 Personal history of transient ischemic attack (TIA), and cerebral infarction without residual deficits: Secondary | ICD-10-CM | POA: Diagnosis not present

## 2022-08-12 DIAGNOSIS — F0390 Unspecified dementia without behavioral disturbance: Secondary | ICD-10-CM | POA: Diagnosis present

## 2022-08-12 DIAGNOSIS — I2510 Atherosclerotic heart disease of native coronary artery without angina pectoris: Secondary | ICD-10-CM | POA: Diagnosis present

## 2022-08-12 DIAGNOSIS — N390 Urinary tract infection, site not specified: Secondary | ICD-10-CM | POA: Diagnosis present

## 2022-08-12 DIAGNOSIS — E039 Hypothyroidism, unspecified: Secondary | ICD-10-CM | POA: Diagnosis present

## 2022-08-12 DIAGNOSIS — Z90710 Acquired absence of both cervix and uterus: Secondary | ICD-10-CM | POA: Diagnosis not present

## 2022-08-12 DIAGNOSIS — B952 Enterococcus as the cause of diseases classified elsewhere: Secondary | ICD-10-CM | POA: Diagnosis present

## 2022-08-12 DIAGNOSIS — Z8249 Family history of ischemic heart disease and other diseases of the circulatory system: Secondary | ICD-10-CM | POA: Diagnosis not present

## 2022-08-12 DIAGNOSIS — E02 Subclinical iodine-deficiency hypothyroidism: Secondary | ICD-10-CM | POA: Diagnosis not present

## 2022-08-12 DIAGNOSIS — G9341 Metabolic encephalopathy: Secondary | ICD-10-CM | POA: Diagnosis present

## 2022-08-12 DIAGNOSIS — Z7982 Long term (current) use of aspirin: Secondary | ICD-10-CM | POA: Diagnosis not present

## 2022-08-12 DIAGNOSIS — Z79899 Other long term (current) drug therapy: Secondary | ICD-10-CM | POA: Diagnosis not present

## 2022-08-12 DIAGNOSIS — N3000 Acute cystitis without hematuria: Secondary | ICD-10-CM | POA: Diagnosis not present

## 2022-08-12 DIAGNOSIS — I252 Old myocardial infarction: Secondary | ICD-10-CM | POA: Diagnosis not present

## 2022-08-12 DIAGNOSIS — Z91148 Patient's other noncompliance with medication regimen for other reason: Secondary | ICD-10-CM | POA: Diagnosis not present

## 2022-08-12 DIAGNOSIS — Z79890 Hormone replacement therapy (postmenopausal): Secondary | ICD-10-CM | POA: Diagnosis not present

## 2022-08-12 DIAGNOSIS — Z9049 Acquired absence of other specified parts of digestive tract: Secondary | ICD-10-CM | POA: Diagnosis not present

## 2022-08-12 LAB — BASIC METABOLIC PANEL
Anion gap: 6 (ref 5–15)
BUN: 25 mg/dL — ABNORMAL HIGH (ref 8–23)
CO2: 25 mmol/L (ref 22–32)
Calcium: 9 mg/dL (ref 8.9–10.3)
Chloride: 107 mmol/L (ref 98–111)
Creatinine, Ser: 1.25 mg/dL — ABNORMAL HIGH (ref 0.44–1.00)
GFR, Estimated: 45 mL/min — ABNORMAL LOW (ref >60)
Glucose, Bld: 150 mg/dL — ABNORMAL HIGH (ref 70–99)
Potassium: 3.4 mmol/L — ABNORMAL LOW (ref 3.5–5.1)
Sodium: 138 mmol/L (ref 135–145)

## 2022-08-12 LAB — CBC
HCT: 36.7 % (ref 36.0–46.0)
Hemoglobin: 12.2 g/dL (ref 12.0–15.0)
MCH: 29.8 pg (ref 26.0–34.0)
MCHC: 33.2 g/dL (ref 30.0–36.0)
MCV: 89.5 fL (ref 80.0–100.0)
Platelets: 284 10*3/uL (ref 150–400)
RBC: 4.1 MIL/uL (ref 3.87–5.11)
RDW: 13.9 % (ref 11.5–15.5)
WBC: 9.4 10*3/uL (ref 4.0–10.5)
nRBC: 0 % (ref 0.0–0.2)

## 2022-08-12 LAB — MAGNESIUM: Magnesium: 2 mg/dL (ref 1.7–2.4)

## 2022-08-12 LAB — HEMOGLOBIN A1C
Hgb A1c MFr Bld: 8.1 % — ABNORMAL HIGH (ref 4.8–5.6)
Mean Plasma Glucose: 185.77 mg/dL

## 2022-08-12 LAB — GLUCOSE, CAPILLARY: Glucose-Capillary: 168 mg/dL — ABNORMAL HIGH (ref 70–99)

## 2022-08-12 MED ORDER — POTASSIUM CHLORIDE CRYS ER 20 MEQ PO TBCR
20.0000 meq | EXTENDED_RELEASE_TABLET | Freq: Two times a day (BID) | ORAL | Status: AC
  Administered 2022-08-12 (×2): 20 meq via ORAL
  Filled 2022-08-12 (×2): qty 1

## 2022-08-12 MED ORDER — LACTATED RINGERS IV SOLN: INTRAVENOUS | Status: DC

## 2022-08-12 MED ORDER — INSULIN ASPART 100 UNIT/ML IJ SOLN
0.0000 [IU] | Freq: Three times a day (TID) | INTRAMUSCULAR | Status: DC
  Administered 2022-08-13 (×2): 2 [IU] via SUBCUTANEOUS
  Administered 2022-08-13: 3 [IU] via SUBCUTANEOUS
  Administered 2022-08-14: 2 [IU] via SUBCUTANEOUS
  Administered 2022-08-14: 3 [IU] via SUBCUTANEOUS
  Administered 2022-08-14: 1 [IU] via SUBCUTANEOUS
  Administered 2022-08-15: 5 [IU] via SUBCUTANEOUS
  Administered 2022-08-15: 1 [IU] via SUBCUTANEOUS

## 2022-08-12 NOTE — Hospital Course (Signed)
76 y.o. female, with history of bradycardia, breast cancer status post mastectomy, diabetes mellitus, hyperlipidemia, hypertension, coronary disease, hypothyroidism , was brought by her daughter due to confusion, patient with baseline dementia, but she is able to live by herself, per her report mother has been more confused recently, she called 911 few days ago as she thought her home was flooding, patient was diagnosed with UTI 915/2023, she was given Keflex, but it is unclear how compliant patient with her medication as then she was noted to have hypothyroidism with TSH of 270 due to noncompliance with medication, daughter has been calling her mother recently on a daily basis to remind her to take her meds.  Patient can ambulate with a cane, able to take care of himself at home, but daughter noted that patient did not leave her couch, she did soil herself, and she has not been eating or drinking or taking her medications.  ED her work-up was significant for elevated TSH at 47 (down from 270), still having positive urine analysis (most recent UA 9/15 growing E. coli), her potassium was low at 3.4, creatinine at 1.39, UA still significant for pyuria, chest x-ray with no acute findings, hospitalist consulted to admit

## 2022-08-12 NOTE — Plan of Care (Signed)
  Problem: Acute Rehab PT Goals(only PT should resolve) Goal: Pt Will Go Supine/Side To Sit Outcome: Progressing Flowsheets (Taken 08/12/2022 1022) Pt will go Supine/Side to Sit: with min guard assist Goal: Patient Will Transfer Sit To/From Stand Outcome: Progressing Flowsheets (Taken 08/12/2022 1022) Patient will transfer sit to/from stand:  with minimal assist  with min guard assist Goal: Pt Will Transfer Bed To Chair/Chair To Bed Outcome: Progressing Flowsheets (Taken 08/12/2022 1022) Pt will Transfer Bed to Chair/Chair to Bed:  min guard assist  with min assist Goal: Pt Will Ambulate Outcome: Progressing Flowsheets (Taken 08/12/2022 1022) Pt will Ambulate:  25 feet  with min guard assist  with minimal assist  with least restrictive assistive device Goal: Pt/caregiver will Perform Home Exercise Program Outcome: Progressing Flowsheets (Taken 08/12/2022 1022) Pt/caregiver will Perform Home Exercise Program:  For increased strengthening  For improved balance  With Supervision, verbal cues required/provided  10:22 AM, 08/12/22 Mearl Latin PT, DPT Physical Therapist at [facility]

## 2022-08-12 NOTE — Evaluation (Signed)
Physical Therapy Evaluation Patient Details Name: Brianna Weber MRN: [id number] DOB: 07-Dec-1945 Today's Date: 08/12/2022  History of Present Illness  Brianna Weber  is a 76 y.o. female, with history of bradycardia, breast cancer status post mastectomy, diabetes mellitus, hyperlipidemia, hypertension, coronary disease, hypothyroidism , was brought by her daughter due to confusion, patient with baseline dementia, but she is able to live by herself, per her report mother has been more confused recently, she called 911 few days ago as she thought her home was flooding, patient was diagnosed with UTI 915/2023, she was given Keflex, but it is unclear how compliant patient with her medication as then she was noted to have hypothyroidism with TSH of 270 due to noncompliance with medication, daughter has been calling her mother recently on a daily basis to remind her to take her meds.   Clinical Impression  Patient limited for functional mobility as stated below secondary to BLE weakness, fatigue and poor standing balance. Patient requires increased time, use of bed rails, and assist to transition to seated EOB. She demonstrates fair sitting balance and good sitting tolerance at EOB. She requires assist and use of RW to transfer to standing. Patient ambulates with very slow, small steps with RW and is given cueing for proper RW use with fair carry over. Patient limited by fatigue after a few steps and returns to bed at end of session. Patient will benefit from continued physical therapy in hospital and recommended venue below to increase strength, balance, endurance for safe ADLs and gait.        Recommendations for follow up therapy are one component of a multi-disciplinary discharge planning process, led by the attending physician.  Recommendations may be updated based on patient status, additional functional criteria and insurance authorization.  Follow Up Recommendations Skilled nursing-short term  rehab (<3 hours/day) Can patient physically be transported by private vehicle: Yes    Assistance Recommended at Discharge Frequent or constant Supervision/Assistance  Patient can return home with the following  A lot of help with walking and/or transfers;A lot of help with bathing/dressing/bathroom;Help with stairs or ramp for entrance    Equipment Recommendations None recommended by PT  Recommendations for Other Services       Functional Status Assessment Patient has had a recent decline in their functional status and demonstrates the ability to make significant improvements in function in a reasonable and predictable amount of time.     Precautions / Restrictions Precautions Precautions: Fall Restrictions Weight Bearing Restrictions: No      Mobility  Bed Mobility Overal bed mobility: Needs Assistance Bed Mobility: Supine to Sit, Sit to Supine     Supine to sit: Min assist Sit to supine: Min guard   General bed mobility comments: slow, labored, requires use of bedrails and assist to pull to seated EOB    Transfers Overall transfer level: Needs assistance Equipment used: Rolling walker (2 wheels) Transfers: Sit to/from Stand Sit to Stand: Min assist, Mod assist           General transfer comment: labored transfer to standing with RW    Ambulation/Gait Ambulation/Gait assistance: Min assist Gait Distance (Feet): 5 Feet Assistive device: Rolling walker (2 wheels) Gait Pattern/deviations: Shuffle Gait velocity: decreased     General Gait Details: cueing for proper RW use; very slow, short steps  Stairs            Wheelchair Mobility    Modified Rankin (Stroke Patients Only)       Balance  Overall balance assessment: Needs assistance Sitting-balance support: No upper extremity supported, Feet supported Sitting balance-Leahy Scale: Fair Sitting balance - Comments: seated EOB   Standing balance support: Bilateral upper extremity  supported Standing balance-Leahy Scale: Poor Standing balance comment: fair/poor with RW                             Pertinent Vitals/Pain Pain Assessment Pain Assessment: No/denies pain    Home Living Family/patient expects to be discharged to:: Private residence Living Arrangements: Alone Available Help at Discharge: Family;Available PRN/intermittently Type of Home: House Home Access: Stairs to enter Entrance Stairs-Rails: Psychiatric nurse of Steps: 5   Home Layout: One level Home Equipment: Conservation officer, nature (2 wheels);Cane - single point      Prior Function Prior Level of Function : Independent/Modified Independent             Mobility Comments: Patient states short distance community ambulation ADLs Comments: states independent with ADL     Hand Dominance        Extremity/Trunk Assessment   Upper Extremity Assessment Upper Extremity Assessment: Defer to OT evaluation    Lower Extremity Assessment Lower Extremity Assessment: Generalized weakness    Cervical / Trunk Assessment Cervical / Trunk Assessment: Normal  Communication   Communication: No difficulties  Cognition Arousal/Alertness: Awake/alert Behavior During Therapy: WFL for tasks assessed/performed Overall Cognitive Status: Within Functional Limits for tasks assessed                                          General Comments      Exercises     Assessment/Plan    PT Assessment Patient needs continued PT services  PT Problem List Decreased mobility;Decreased strength;Decreased activity tolerance;Decreased balance;Decreased knowledge of use of DME       PT Treatment Interventions DME instruction;Therapeutic exercise;Gait training;Balance training;Manual techniques;Stair training;Neuromuscular re-education;Functional mobility training;Therapeutic activities;Patient/family education    PT Goals (Current goals can be found in the Care Plan section)   Acute Rehab PT Goals Patient Stated Goal: return home PT Goal Formulation: With patient Time For Goal Achievement: 08/26/22 Potential to Achieve Goals: Fair    Frequency Min 3X/week     Co-evaluation               AM-PAC PT "6 Clicks" Mobility  Outcome Measure Help needed turning from your back to your side while in a flat bed without using bedrails?: A Little Help needed moving from lying on your back to sitting on the side of a flat bed without using bedrails?: A Little Help needed moving to and from a bed to a chair (including a wheelchair)?: A Lot Help needed standing up from a chair using your arms (e.g., wheelchair or bedside chair)?: A Little Help needed to walk in hospital room?: A Lot Help needed climbing 3-5 steps with a railing? : A Lot 6 Click Score: 15    End of Session Equipment Utilized During Treatment: Gait belt Activity Tolerance: Patient tolerated treatment well;Patient limited by fatigue Patient left: in bed;with call bell/phone within reach;with bed alarm set Nurse Communication: Mobility status PT Visit Diagnosis: Unsteadiness on feet (R26.81);Other abnormalities of gait and mobility (R26.89)    Time: 7902-4097 PT Time Calculation (min) (ACUTE ONLY): 15 min   Charges:   PT Evaluation $PT Eval Low Complexity: 1 Low PT Treatments $Therapeutic  Activity: 8-22 mins        10:21 AM, 08/12/22 Mearl Latin PT, DPT Physical Therapist at [facility]

## 2022-08-12 NOTE — TOC Initial Note (Addendum)
Transition of Care [facility]) - Initial/Assessment Note    Patient Details  Name: Brianna Weber MRN: [id number] Date of Birth: Jul 31, 1946  [facility] CM/SW Contact:    Boneta Lucks, RN Phone Number: 08/12/2022, 12:31 PM  Clinical Narrative:  Patient admitted with UTI. PT is recommending SNF. TOC spoke with patient, she wants to go home with home health. She states she has a cat and does not want to leave it alone. She has a cane at home. TOC ask permission to talk to her daughter. She agreed, left a message for her daughter.                 Addendum:  Daughter called, They have an aunt that works at [employer] for 15 years. They have been talking to them about placement. Admission will not review until Monday. FL2 sent, MD updated.     Barriers to Discharge: Continued Medical Work up   Patient Goals and CMS Choice Patient states their goals for this hospitalization and ongoing recovery are:: to go home. CMS Medicare.gov Compare Post Acute Care list provided to:: Patient    Expected Discharge Plan and Services         Living arrangements for the past 2 months: Single Family Home                   Prior Living Arrangements/Services Living arrangements for the past 2 months: [place] Lives with:: Self Patient language and need for interpreter reviewed:: Yes        Need for Family Participation in Patient Care: Yes (Comment) Care giver support system in place?: Yes (comment)   Criminal Activity/Legal Involvement Pertinent to Current Situation/Hospitalization: No - Comment as needed  Activities of Daily Living Home Assistive Devices/Equipment: Shower chair with back ADL Screening (condition at time of admission) Patient's cognitive ability adequate to safely complete daily activities?: Yes Is the patient deaf or have difficulty hearing?: No Does the patient have difficulty seeing, even when wearing glasses/contacts?: No Does the patient have  difficulty concentrating, remembering, or making decisions?: Yes Patient able to express need for assistance with ADLs?: Yes Does the patient have difficulty dressing or bathing?: Yes Independently performs ADLs?: Yes (appropriate for developmental age) Does the patient have difficulty walking or climbing stairs?: No Weakness of Legs: None Weakness of Arms/Hands: None      Emotional Assessment       Orientation: : Oriented to Self, Oriented to Place, Oriented to  Time Alcohol / Substance Use: Not Applicable Psych Involvement: No (comment)  Admission diagnosis:  UTI (urinary tract infection) [N39.0] Acute cystitis with hematuria [N30.01] Altered mental status, unspecified altered mental status type [R41.82] Hypothyroidism, unspecified type [E03.9] Patient Active Problem List   Diagnosis Date Noted   UTI (urinary tract infection) 08/11/2022   AMS (altered mental status) 08/11/2022   Hypoglycemia 08/27/2018   Breast cancer ([place]) 08/27/2018   Depression    Vitamin D deficiency 01/06/2016   Peripheral edema 09/16/2014   Type 2 diabetes mellitus ([place]) 03/17/2013   CARDIOMYOPATHY, 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

## 2022-08-12 NOTE — Progress Notes (Signed)
PROGRESS NOTE   Brianna Weber  OVF:[id number] DOB: 17-Jan-1946 DOA: 08/11/2022 PCP: Jacelyn Pi, MD   Chief Complaint  Patient presents with   Altered Mental Status   Level of care: Med-Surg  Brief Admission History:  76 y.o. female, with history of bradycardia, breast cancer status post mastectomy, diabetes mellitus, hyperlipidemia, hypertension, coronary disease, hypothyroidism , was brought by her daughter due to confusion, patient with baseline dementia, but she is able to live by herself, per her report mother has been more confused recently, she called 911 few days ago as she thought her home was flooding, patient was diagnosed with UTI 915/2023, she was given Keflex, but it is unclear how compliant patient with her medication as then she was noted to have hypothyroidism with TSH of 270 due to noncompliance with medication, daughter has been calling her mother recently on a daily basis to remind her to take her meds.  Patient can ambulate with a cane, able to take care of himself at home, but daughter noted that patient did not leave her couch, she did soil herself, and she has not been eating or drinking or taking her medications.  ED her work-up was significant for elevated TSH at 47 (down from 270), still having positive urine analysis (most recent UA 9/15 growing E. coli), her potassium was low at 3.4, creatinine at 1.39, UA still significant for pyuria, chest x-ray with no acute findings, hospitalist consulted to admit   Assessment and Plan:  UTI  - continue IV ceftriaxone and supportive measures  Type 2 DM  - continue SSI coverage with CBG monitoring  Hypothyroidism - continue daily thyroid supplementation  CAD - stable, resumed home imdur, aspirin  Dementia - delirium precautions - resume home donzepil  Sinus bradycardia - stable, following, asymptomatic    DVT prophylaxis: [place] heparin  Code Status: full  Family Communication:  daughters bedside  9/30 Disposition: Status is: Observation The patient remains OBS appropriate and will d/c before 2 midnights.   Consultants:   Procedures:   Antimicrobials:  Ceftriaxone 9/30>>  Subjective: Intermittently confused.    Objective: Vitals:   08/11/22 1730 08/11/22 1800 08/11/22 2123 08/12/22 0442  BP: (!) 154/80 (!) 141/84 (!) 148/52 130/68  Pulse: (!) 54 (!) 58 (!) 49 (!) 50  Resp: 10 16    Temp:  97.6 F (36.4 C) 97.8 F (36.6 C) 98.1 F (36.7 C)  TempSrc:  Oral Oral Oral  SpO2: 95% 100% 99% 94%    Intake/Output Summary (Last 24 hours) at 08/12/2022 1142 Last data filed at 08/12/2022 0300 Gross per 24 hour  Intake 100 ml  Output 200 ml  Net -100 ml   There were no vitals filed for this visit. Examination:  General exam: frail elderly female, awake, alert, edentulous, Appears calm and comfortable  Respiratory system: Clear to auscultation. Respiratory effort normal. Cardiovascular system: normal S1 & S2 heard. No JVD, murmurs, rubs, gallops or clicks. No pedal edema. Gastrointestinal system: Abdomen is nondistended, soft and nontender. No organomegaly or masses felt. Normal bowel sounds heard. Central nervous system: Alert and oriented to person. No focal neurological deficits. Extremities: Symmetric 5 x 5 power. Skin: No rashes, lesions or ulcers. Psychiatry: Judgement and insight appear poor. Mood & affect appropriate.   Data Reviewed: I have personally reviewed following labs and imaging studies  CBC: Recent Labs  Lab 08/11/22 1253 08/12/22 0426  WBC 9.3 9.4  HGB 13.2 12.2  HCT 39.2 36.7  MCV 89.3 89.5  PLT 300  182    Basic Metabolic Panel: Recent Labs  Lab 08/11/22 1253 08/12/22 0426  NA 137 138  K 3.4* 3.4*  CL 102 107  CO2 26 25  GLUCOSE 164* 150*  BUN 25* 25*  CREATININE 1.39* 1.25*  CALCIUM 9.3 9.0  MG  --  2.0    CBG: No results for input(s): "GLUCAP" in the last 168 hours.  Recent Results (from the past 240 hour(s))  Blood culture  (routine x 2)     Status: None (Preliminary result)   Collection Time: 08/11/22 12:53 PM   Specimen: Right Antecubital; Blood  Result Value Ref Range Status   Specimen Description   Final    RIGHT ANTECUBITAL BOTTLES DRAWN AEROBIC AND ANAEROBIC   Special Requests Blood Culture adequate volume  Final   Culture   Final    NO GROWTH < 12 HOURS Performed at [facility], [address], [place], [place] [address]    Report Status PENDING  Incomplete  Blood culture (routine x 2)     Status: None (Preliminary result)   Collection Time: 08/11/22 12:58 PM   Specimen: Left Antecubital; Blood  Result Value Ref Range Status   Specimen Description   Final    LEFT ANTECUBITAL BOTTLES DRAWN AEROBIC AND ANAEROBIC   Special Requests Blood Culture adequate volume  Final   Culture   Final    NO GROWTH < 12 HOURS Performed at [facility], [address] [place], [place] [address]    Report Status PENDING  Incomplete     Radiology Studies: DG Chest Portable 1 View  Result Date: 08/11/2022 CLINICAL DATA:  Bibasilar rales. EXAM: PORTABLE CHEST 1 VIEW COMPARISON:  May 04, 2006. FINDINGS: The heart size and mediastinal contours are within normal limits. Both lungs are clear. The visualized skeletal structures are unremarkable. IMPRESSION: No active disease. Electronically Signed   By: Marijo Conception M.D.   On: 08/11/2022 14:50    Scheduled Meds:  aspirin EC  81 mg Oral Daily   benazepril  40 mg Oral Daily   cholecalciferol  1,000 Units Oral Daily   donepezil  10 mg Oral Daily   heparin  5,000 Units Subcutaneous Q8H   isosorbide mononitrate  60 mg Oral Daily   levothyroxine  150 mcg Oral Daily   potassium chloride  20 mEq Oral BID   simvastatin  10 mg Oral Daily   vitamin E  200 Units Oral Daily   Continuous Infusions:  cefTRIAXone (ROCEPHIN)  IV Stopped (08/11/22 1654)   lactated ringers       LOS: 0 days   Time spent: 34 mins  [name], MD How to contact the [facility] Attending or  Consulting provider [place] or covering provider during after hours [place], for this patient?  Check the care team in [facility] and look for a) attending/consulting TRH provider listed and b) the [facility] team listed Log into [email or web address] and use [place]'s universal password to access. If you do not have the password, please contact the hospital operator. Locate the [facility] provider you are looking for under [facility] and page to a number that you can be directly reached. If you still have difficulty reaching the provider, please page the [facility] (Director on Call) for the Hospitalists listed on amion for assistance.  08/12/2022, 11:42 AM

## 2022-08-12 NOTE — NC FL2 (Signed)
[place] LEVEL OF CARE SCREENING TOOL     IDENTIFICATION  Patient Name: Brianna Weber Birthdate: 1946/01/08 Sex: female Admission Date (Current Location): 08/11/2022  [facility] and [place] Number:  [employer] and Address:  [place] [address], [place]      Provider Number: [id number]  Attending Physician Name and Address:  Murlean Iba, MD  Relative Name and Phone Number:  [name] (Daughter)   [phone]    Current Level of Care: Hospital Recommended Level of Care: [place] Prior Approval Number:    Date Approved/Denied:   PASRR Number: [name] (Daughter)   [phone]  Discharge Plan: SNF    Current Diagnoses: Patient Active Problem List   Diagnosis Date Noted   UTI (urinary tract infection) 08/11/2022   AMS (altered mental status) 08/11/2022   Hypoglycemia 08/27/2018   Breast cancer ([place]) 08/27/2018   Depression    Vitamin D deficiency 01/06/2016   Peripheral edema 09/16/2014   Type 2 diabetes mellitus ([place]) 03/17/2013   CARDIOMYOPATHY, 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   Hypothyroidism 05/04/2010   Hyperlipidemia with target LDL less than 100 05/04/2010   Essential hypertension 05/04/2010   Bradycardia 05/04/2010    Orientation RESPIRATION BLADDER Height & Weight     Self, Place, Time  Normal Incontinent Weight:   Height:     BEHAVIORAL SYMPTOMS/MOOD NEUROLOGICAL BOWEL NUTRITION STATUS      Continent Diet (See DC summary)  AMBULATORY STATUS COMMUNICATION OF NEEDS Skin   Extensive Assist Verbally Normal                       Personal Care Assistance Level of Assistance  Bathing, Feeding, Dressing Bathing Assistance: Maximum assistance Feeding assistance: Independent Dressing Assistance: Limited assistance     Functional Limitations Info  Sight, Hearing, Speech Sight Info: Adequate Hearing Info: Impaired Speech Info: Adequate    SPECIAL CARE FACTORS  FREQUENCY        PT Frequency: 5 times a week              Contractures Contractures Info: Not present    Additional Factors Info  Allergies, Code Status Code Status Info: FULL Allergies Info: NKDA           Current Medications (08/12/2022):  This is the current hospital active medication list Current Facility-Administered Medications  Medication Dose Route Frequency Provider Last Rate Last Admin   acetaminophen (TYLENOL) tablet 650 mg  650 mg Oral Q6H PRN Elgergawy, Silver Huguenin, MD       Or   acetaminophen (TYLENOL) suppository 650 mg  650 mg Rectal Q6H PRN Elgergawy, Silver Huguenin, MD       aspirin EC tablet 81 mg  81 mg Oral Daily Elgergawy, Silver Huguenin, MD   81 mg at 08/12/22 0927   benazepril (LOTENSIN) tablet 40 mg  40 mg Oral Daily Elgergawy, Silver Huguenin, MD   40 mg at 08/12/22 0927   cefTRIAXone (ROCEPHIN) 1 g in sodium chloride 0.9 % 100 mL IVPB  1 g Intravenous Q24H Marcello Fennel, PA-C   Stopped at 08/11/22 1654   cholecalciferol (VITAMIN D3) 25 MCG (1000 UNIT) tablet 1,000 Units  1,000 Units Oral Daily Elgergawy, Silver Huguenin, MD   1,000 Units at 08/12/22 0927   donepezil (ARICEPT) tablet 10 mg  10 mg Oral Daily Elgergawy, Silver Huguenin, MD   10 mg at 08/12/22 0927   heparin injection 5,000 Units  5,000 Units Subcutaneous Q8H  Elgergawy, Silver Huguenin, MD   5,000 Units at 08/12/22 5329   isosorbide mononitrate (IMDUR) 24 hr tablet 60 mg  60 mg Oral Daily Elgergawy, Silver Huguenin, MD   60 mg at 08/12/22 9242   lactated ringers infusion   Intravenous Continuous Irwin Brakeman L, MD 65 mL/hr at 08/12/22 1207 New Bag at 08/12/22 1207   levothyroxine (SYNTHROID) tablet 150 mcg  150 mcg Oral Daily Elgergawy, Silver Huguenin, MD   150 mcg at 08/12/22 0621   ondansetron (ZOFRAN) tablet 4 mg  4 mg Oral Q6H PRN Elgergawy, Silver Huguenin, MD       Or   ondansetron (ZOFRAN) injection 4 mg  4 mg Intravenous Q6H PRN Elgergawy, Silver Huguenin, MD       Oral care mouth rinse  15 mL Mouth Rinse PRN Elgergawy, Silver Huguenin, MD        potassium chloride SA (KLOR-CON M) CR tablet 20 mEq  20 mEq Oral BID Johnson, Clanford L, MD   20 mEq at 08/12/22 1055   simvastatin (ZOCOR) tablet 10 mg  10 mg Oral Daily Elgergawy, Silver Huguenin, MD   10 mg at 08/12/22 6834   vitamin E capsule 200 Units  200 Units Oral Daily Elgergawy, Silver Huguenin, MD   200 Units at 08/12/22 1962     Discharge Medications: Please see discharge summary for a list of discharge medications.  Relevant Imaging Results:  Relevant Lab Results:   Additional Information SS# [id number]  Boneta Lucks, RN

## 2022-08-13 DIAGNOSIS — N3000 Acute cystitis without hematuria: Secondary | ICD-10-CM | POA: Diagnosis not present

## 2022-08-13 DIAGNOSIS — R001 Bradycardia, unspecified: Secondary | ICD-10-CM | POA: Diagnosis not present

## 2022-08-13 DIAGNOSIS — E02 Subclinical iodine-deficiency hypothyroidism: Secondary | ICD-10-CM | POA: Diagnosis not present

## 2022-08-13 DIAGNOSIS — I10 Essential (primary) hypertension: Secondary | ICD-10-CM | POA: Diagnosis not present

## 2022-08-13 LAB — GLUCOSE, CAPILLARY
Glucose-Capillary: 168 mg/dL — ABNORMAL HIGH (ref 70–99)
Glucose-Capillary: 188 mg/dL — ABNORMAL HIGH (ref 70–99)
Glucose-Capillary: 222 mg/dL — ABNORMAL HIGH (ref 70–99)
Glucose-Capillary: 182 mg/dL — ABNORMAL HIGH (ref 70–99)
Glucose-Capillary: 136 mg/dL — ABNORMAL HIGH (ref 70–99)

## 2022-08-13 NOTE — TOC Progression Note (Signed)
Transition of Care [facility]) - Progression Note    Patient Details  Name: Brianna Weber MRN: [id number] Date of Birth: [birth year]  [facility] CM/SW Contact  Shade Flood, LCSW Phone Number: 08/13/2022, 1:37 PM  Clinical Narrative:     TOC following. SNF auth started today with [facility] facility pending. TOC will add SNF once pt has bed offer and accepts. MD hoping for dc Monday.    Barriers to Discharge: Continued Medical Work up  Expected Discharge Plan and Services         Living arrangements for the past 2 months: Single Family Home                                       Social Determinants of Health (SDOH) Interventions    Readmission Risk Interventions     No data to display

## 2022-08-13 NOTE — Progress Notes (Signed)
PROGRESS NOTE   Brianna Weber  KCL:[id number] DOB: 1946/04/23 DOA: 08/11/2022 PCP: Jacelyn Pi, MD   Chief Complaint  Patient presents with   Altered Mental Status   Level of care: Med-Surg  Brief Admission History:  76 y.o. female, with history of bradycardia, breast cancer status post mastectomy, diabetes mellitus, hyperlipidemia, hypertension, coronary disease, hypothyroidism , was brought by her daughter due to confusion, patient with baseline dementia, but she is able to live by herself, per her report mother has been more confused recently, she called 911 few days ago as she thought her home was flooding, patient was diagnosed with UTI 915/2023, she was given Keflex, but it is unclear how compliant patient with her medication as then she was noted to have hypothyroidism with TSH of 270 due to noncompliance with medication, daughter has been calling her mother recently on a daily basis to remind her to take her meds.  Patient can ambulate with a cane, able to take care of himself at home, but daughter noted that patient did not leave her couch, she did soil herself, and she has not been eating or drinking or taking her medications.  ED her work-up was significant for elevated TSH at 47 (down from 270), still having positive urine analysis (most recent UA 9/15 growing E. coli), her potassium was low at 3.4, creatinine at 1.39, UA still significant for pyuria, chest x-ray with no acute findings, hospitalist consulted to admit   Assessment and Plan:  Enterococcus UTI  - continue IV ceftriaxone and supportive measures - follow up C&S results   Type 2 DM  - continue SSI coverage with CBG monitoring CBG (last 3)  Recent Labs    08/13/22 0655 08/13/22 0725 08/13/22 1115  GLUCAP 188* 168* 222*   Hypothyroidism - continue daily thyroid supplementation  CAD - stable, resumed home imdur, aspirin  Dementia - delirium precautions - resume home donzepil  Sinus bradycardia -  stable, following, asymptomatic    DVT prophylaxis: [place] heparin  Code Status: full  Family Communication:  daughters bedside 9/30, 10/1 Disposition: Status is: INP    Consultants:   Procedures:   Antimicrobials:  Ceftriaxone 9/30>>  Subjective: Pt wants to go home to be with her cat but reluctantly agrees that she needs acute rehab due to weakness.     Objective: Vitals:   08/12/22 1330 08/12/22 2102 08/13/22 0408 08/13/22 1408  BP: (!) 124/57 (!) 114/52 (!) 120/57 126/80  Pulse: (!) 58 62 (!) 59 70  Resp: '18 15 14 18  '$ Temp: 98.3 F (36.8 C) 98.6 F (37 C) 98 F (36.7 C) 98.4 F (36.9 C)  TempSrc: Oral Oral Oral Oral  SpO2: 95% 98% 97% 97%    Intake/Output Summary (Last 24 hours) at 08/13/2022 1551 Last data filed at 08/13/2022 1202 Gross per 24 hour  Intake 720 ml  Output 650 ml  Net 70 ml    Examination:  General exam: frail elderly female, awake, alert, confused, poor short term memory recall, edentulous, Appears calm and comfortable  Respiratory system: Clear to auscultation. Respiratory effort normal. Cardiovascular system: normal S1 & S2 heard. No JVD, murmurs, rubs, gallops or clicks. No pedal edema. Gastrointestinal system: Abdomen is nondistended, soft and nontender. No organomegaly or masses felt. Normal bowel sounds heard. Central nervous system: Alert and oriented to person. No focal neurological deficits. Extremities: Symmetric 5 x 5 power. Skin: No rashes, lesions or ulcers. Psychiatry: Judgement and insight appear poor. Mood & affect appropriate.   Data  Reviewed: I have personally reviewed following labs and imaging studies  CBC: Recent Labs  Lab 08/11/22 1253 08/12/22 0426  WBC 9.3 9.4  HGB 13.2 12.2  HCT 39.2 36.7  MCV 89.3 89.5  PLT 300 403    Basic Metabolic Panel: Recent Labs  Lab 08/11/22 1253 08/12/22 0426  NA 137 138  K 3.4* 3.4*  CL 102 107  CO2 26 25  GLUCOSE 164* 150*  BUN 25* 25*  CREATININE 1.39* 1.25*  CALCIUM 9.3  9.0  MG  --  2.0    CBG: Recent Labs  Lab 08/12/22 2227 08/13/22 0655 08/13/22 0725 08/13/22 1115  GLUCAP 168* 188* 168* 222*    Recent Results (from the past 240 hour(s))  Blood culture (routine x 2)     Status: None (Preliminary result)   Collection Time: 08/11/22 12:53 PM   Specimen: Right Antecubital; Blood  Result Value Ref Range Status   Specimen Description   Final    RIGHT ANTECUBITAL BOTTLES DRAWN AEROBIC AND ANAEROBIC   Special Requests Blood Culture adequate volume  Final   Culture   Final    NO GROWTH 2 DAYS Performed at [facility], [address], [place], [place] [address]    Report Status PENDING  Incomplete  Blood culture (routine x 2)     Status: None (Preliminary result)   Collection Time: 08/11/22 12:58 PM   Specimen: Left Antecubital; Blood  Result Value Ref Range Status   Specimen Description   Final    LEFT ANTECUBITAL BOTTLES DRAWN AEROBIC AND ANAEROBIC   Special Requests Blood Culture adequate volume  Final   Culture   Final    NO GROWTH 2 DAYS Performed at [facility], [address] [place], [place] [address]    Report Status PENDING  Incomplete  Urine Culture     Status: Abnormal (Preliminary result)   Collection Time: 08/11/22  4:05 PM   Specimen: In/Out Cath Urine  Result Value Ref Range Status   Specimen Description   Final    IN/OUT CATH URINE Performed at [facility], [address] [place], [place] [address]    Special Requests   Final    NONE Performed at [facility], [address], [place], [place] [address]    Culture >=100,000 COLONIES/mL ENTEROCOCCUS FAECALIS (A)  Final   Report Status PENDING  Incomplete     Radiology Studies: No results found.  Scheduled Meds:  aspirin EC  81 mg Oral Daily   benazepril  40 mg Oral Daily   cholecalciferol  1,000 Units Oral Daily   donepezil  10 mg Oral Daily   heparin  5,000 Units Subcutaneous Q8H   insulin aspart  0-9 Units Subcutaneous TID WC   isosorbide mononitrate  60 mg  Oral Daily   levothyroxine  150 mcg Oral Daily   simvastatin  10 mg Oral Daily   vitamin E  200 Units Oral Daily   Continuous Infusions:  cefTRIAXone (ROCEPHIN)  IV 1 g (08/12/22 1649)   lactated ringers 65 mL/hr at 08/13/22 0505     LOS: 1 day   Time spent: 68 mins  Daniella Dewberry Wynetta Emery, MD How to contact the [facility] Attending or Consulting provider [place] or covering provider during after hours [place], for this patient?  Check the care team in [facility] and look for a) attending/consulting TRH provider listed and b) the [facility] team listed Log into [email or web address] and use [place]'s universal password to access. If you do not  have the password, please contact the hospital operator. Locate the [facility] provider you are looking for under [facility] and page to a number that you can be directly reached. If you still have difficulty reaching the provider, please page the [facility] (Director on Call) for the Hospitalists listed on amion for assistance.  08/13/2022, 3:51 PM

## 2022-08-14 DIAGNOSIS — N3000 Acute cystitis without hematuria: Secondary | ICD-10-CM | POA: Diagnosis not present

## 2022-08-14 DIAGNOSIS — I10 Essential (primary) hypertension: Secondary | ICD-10-CM | POA: Diagnosis not present

## 2022-08-14 DIAGNOSIS — R001 Bradycardia, unspecified: Secondary | ICD-10-CM | POA: Diagnosis not present

## 2022-08-14 DIAGNOSIS — E02 Subclinical iodine-deficiency hypothyroidism: Secondary | ICD-10-CM | POA: Diagnosis not present

## 2022-08-14 LAB — URINE CULTURE: Culture: >=100000 — AB

## 2022-08-14 LAB — GLUCOSE, CAPILLARY
Glucose-Capillary: 126 mg/dL — ABNORMAL HIGH (ref 70–99)
Glucose-Capillary: 209 mg/dL — ABNORMAL HIGH (ref 70–99)
Glucose-Capillary: 191 mg/dL — ABNORMAL HIGH (ref 70–99)

## 2022-08-14 MED ORDER — AMOXICILLIN 250 MG PO CAPS
500.0000 mg | ORAL_CAPSULE | Freq: Three times a day (TID) | ORAL | Status: DC
  Administered 2022-08-14 – 2022-08-15 (×3): 500 mg via ORAL
  Filled 2022-08-14 (×3): qty 2

## 2022-08-14 NOTE — Progress Notes (Signed)
Patient without complaint today, was OOB to chair for the afternoon, vss remain stable

## 2022-08-14 NOTE — Progress Notes (Signed)
PROGRESS NOTE   Brianna Weber  PPI:[id number] DOB: [birth year] DOA: 08/11/2022 PCP: Jacelyn Pi, MD   Chief Complaint  Patient presents with   Altered Mental Status   Level of care: Med-Surg  Brief Admission History:  76 y.o. female, with history of bradycardia, breast cancer status post mastectomy, diabetes mellitus, hyperlipidemia, hypertension, coronary disease, hypothyroidism , was brought by her daughter due to confusion, patient with baseline dementia, but she is able to live by herself, per her report mother has been more confused recently, she called 911 few days ago as she thought her home was flooding, patient was diagnosed with UTI 915/2023, she was given Keflex, but it is unclear how compliant patient with her medication as then she was noted to have hypothyroidism with TSH of 270 due to noncompliance with medication, daughter has been calling her mother recently on a daily basis to remind her to take her meds.  Patient can ambulate with a cane, able to take care of himself at home, but daughter noted that patient did not leave her couch, she did soil herself, and she has not been eating or drinking or taking her medications.  ED her work-up was significant for elevated TSH at 47 (down from 270), still having positive urine analysis (most recent UA 9/15 growing E. coli), her potassium was low at 3.4, creatinine at 1.39, UA still significant for pyuria, chest x-ray with no acute findings, hospitalist consulted to admit   Assessment and Plan:  Enterococcus UTI  - continue IV ceftriaxone and supportive measures - follow up C&S results --> de-escalated ceftriaxone to amoxicillin   Type 2 DM  - continue SSI coverage with CBG monitoring CBG (last 3)  Recent Labs    08/14/22 0742 08/14/22 1111 08/14/22 1609  GLUCAP 126* 209* 191*   Hypothyroidism - continue daily thyroid supplementation  CAD - stable, resumed home imdur, aspirin  Dementia - delirium precautions -  resume home donzepil  Sinus bradycardia - stable, following, asymptomatic    DVT prophylaxis: Tillar heparin  Code Status: full  Family Communication:  daughters bedside 9/30, 10/1 Disposition: Status is: INP    Consultants:   Procedures:   Antimicrobials:  Ceftriaxone 9/30>> 10/2 Amoxicillin 10/2>> Subjective: Pt without specific complaints.    Objective: Vitals:   08/13/22 1408 08/13/22 2055 08/14/22 0533 08/14/22 1417  BP: 126/80 (!) 150/63 (!) 134/59 127/63  Pulse: 70 62 (!) 59 68  Resp: '18 20 17 18  '$ Temp: 98.4 F (36.9 C) 98.2 F (36.8 C) 98.4 F (36.9 C) 98.5 F (36.9 C)  TempSrc: Oral Oral    SpO2: 97% 99% 93% 97%    Intake/Output Summary (Last 24 hours) at 08/14/2022 1727 Last data filed at 08/14/2022 1230 Gross per 24 hour  Intake 1610.42 ml  Output 1100 ml  Net 510.42 ml    Examination:  General exam: frail elderly female, awake, alert, confused, poor short term memory recall, edentulous, Appears calm and comfortable  Respiratory system: Clear to auscultation. Respiratory effort normal. Cardiovascular system: normal S1 & S2 heard. No JVD, murmurs, rubs, gallops or clicks. No pedal edema. Gastrointestinal system: Abdomen is nondistended, soft and nontender. No organomegaly or masses felt. Normal bowel sounds heard. Central nervous system: Alert and oriented to person. No focal neurological deficits. Extremities: Symmetric 5 x 5 power. Skin: No rashes, lesions or ulcers. Psychiatry: Judgement and insight appear poor. Mood & affect appropriate.   Data Reviewed: I have personally reviewed following labs and imaging studies  CBC: Recent  Labs  Lab 08/11/22 1253 08/12/22 0426  WBC 9.3 9.4  HGB 13.2 12.2  HCT 39.2 36.7  MCV 89.3 89.5  PLT 300 546    Basic Metabolic Panel: Recent Labs  Lab 08/11/22 1253 08/12/22 0426  NA 137 138  K 3.4* 3.4*  CL 102 107  CO2 26 25  GLUCOSE 164* 150*  BUN 25* 25*  CREATININE 1.39* 1.25*  CALCIUM 9.3 9.0  MG   --  2.0    CBG: Recent Labs  Lab 08/13/22 1644 08/13/22 2052 08/14/22 0742 08/14/22 1111 08/14/22 1609  GLUCAP 182* 136* 126* 209* 191*    Recent Results (from the past 240 hour(s))  Blood culture (routine x 2)     Status: None (Preliminary result)   Collection Time: 08/11/22 12:53 PM   Specimen: Right Antecubital; Blood  Result Value Ref Range Status   Specimen Description   Final    RIGHT ANTECUBITAL BOTTLES DRAWN AEROBIC AND ANAEROBIC   Special Requests Blood Culture adequate volume  Final   Culture   Final    NO GROWTH 3 DAYS Performed at [facility], [address] [place], [place] [address]    Report Status PENDING  Incomplete  Blood culture (routine x 2)     Status: None (Preliminary result)   Collection Time: 08/11/22 12:58 PM   Specimen: Left Antecubital; Blood  Result Value Ref Range Status   Specimen Description   Final    LEFT ANTECUBITAL BOTTLES DRAWN AEROBIC AND ANAEROBIC   Special Requests Blood Culture adequate volume  Final   Culture   Final    NO GROWTH 3 DAYS Performed at [facility], [address] [place], [place] [address]    Report Status PENDING  Incomplete  Urine Culture     Status: Abnormal   Collection Time: 08/11/22  4:05 PM   Specimen: In/Out Cath Urine  Result Value Ref Range Status   Specimen Description   Final    IN/OUT CATH URINE Performed at [facility], [address], [place], [place] [address]    Special Requests   Final    NONE Performed at [facility], [address] [place],  [address]    Culture >=100,000 COLONIES/mL ENTEROCOCCUS FAECALIS (A)  Final   Report Status 08/14/2022 FINAL  Final   Organism ID, Bacteria ENTEROCOCCUS FAECALIS (A)  Final      Susceptibility   Enterococcus faecalis - MIC*    AMPICILLIN <=2 SENSITIVE Sensitive     NITROFURANTOIN <=16 SENSITIVE Sensitive     VANCOMYCIN 1 SENSITIVE Sensitive     * >=100,000 COLONIES/mL ENTEROCOCCUS FAECALIS     Radiology Studies: No results  found.  Scheduled Meds:  amoxicillin  500 mg Oral Q8H   aspirin EC  81 mg Oral Daily   benazepril  40 mg Oral Daily   cholecalciferol  1,000 Units Oral Daily   donepezil  10 mg Oral Daily   heparin  5,000 Units Subcutaneous Q8H   insulin aspart  0-9 Units Subcutaneous TID WC   isosorbide mononitrate  60 mg Oral Daily   levothyroxine  150 mcg Oral Daily   simvastatin  10 mg Oral Daily   vitamin E  200 Units Oral Daily   Continuous Infusions:  lactated ringers 50 mL/hr at 08/14/22 0827     LOS: 2 days   Time spent: 35 mins  Delora Gravatt Wynetta Emery, MD How to contact the [facility] Attending or Consulting provider [place] or covering provider during after hours  7P -7A, for this patient?  Check the care team in [facility] and look for a) attending/consulting TRH provider listed and b) the [facility] team listed Log into [email or web address] and use [place]'s universal password to access. If you do not have the password, please contact the hospital operator. Locate the [facility] provider you are looking for under [facility] and page to a number that you can be directly reached. If you still have difficulty reaching the provider, please page the [facility] (Director on Call) for the Hospitalists listed on amion for assistance.  08/14/2022, 5:27 PM

## 2022-08-14 NOTE — Progress Notes (Signed)
Vital signs stable. Patient rested through the night without any complaints.

## 2022-08-14 NOTE — TOC Progression Note (Signed)
Transition of Care [facility]) - Progression Note    Patient Details  Name: Brianna Weber MRN: [id number] Date of Birth: 01/30/1946  [facility] CM/SW Contact  Salome Arnt, [place] Phone Number: 08/14/2022, 1:05 PM  Clinical Narrative:  Pt's daughter accepts bed at [facility] Facility notified. D/C tomorrow. Authorization updated with facility and received. TOC will continue to follow.        Barriers to Discharge: Continued Medical Work up  Expected Discharge Plan and Services         Living arrangements for the past 2 months: Single Family Home                                       Social Determinants of Health (SDOH) Interventions    Readmission Risk Interventions     No data to display

## 2022-08-15 DIAGNOSIS — M6281 Muscle weakness (generalized): Secondary | ICD-10-CM | POA: Diagnosis not present

## 2022-08-15 DIAGNOSIS — I10 Essential (primary) hypertension: Secondary | ICD-10-CM | POA: Diagnosis not present

## 2022-08-15 DIAGNOSIS — E559 Vitamin D deficiency, unspecified: Secondary | ICD-10-CM | POA: Diagnosis not present

## 2022-08-15 DIAGNOSIS — N189 Chronic kidney disease, unspecified: Secondary | ICD-10-CM | POA: Diagnosis not present

## 2022-08-15 DIAGNOSIS — N390 Urinary tract infection, site not specified: Secondary | ICD-10-CM | POA: Diagnosis not present

## 2022-08-15 DIAGNOSIS — Z9010 Acquired absence of unspecified breast and nipple: Secondary | ICD-10-CM | POA: Diagnosis not present

## 2022-08-15 DIAGNOSIS — D649 Anemia, unspecified: Secondary | ICD-10-CM | POA: Diagnosis not present

## 2022-08-15 DIAGNOSIS — R2681 Unsteadiness on feet: Secondary | ICD-10-CM | POA: Diagnosis not present

## 2022-08-15 DIAGNOSIS — Z7982 Long term (current) use of aspirin: Secondary | ICD-10-CM | POA: Diagnosis not present

## 2022-08-15 DIAGNOSIS — F32A Depression, unspecified: Secondary | ICD-10-CM | POA: Diagnosis not present

## 2022-08-15 DIAGNOSIS — Z853 Personal history of malignant neoplasm of breast: Secondary | ICD-10-CM | POA: Diagnosis not present

## 2022-08-15 DIAGNOSIS — E119 Type 2 diabetes mellitus without complications: Secondary | ICD-10-CM

## 2022-08-15 DIAGNOSIS — E02 Subclinical iodine-deficiency hypothyroidism: Secondary | ICD-10-CM | POA: Diagnosis not present

## 2022-08-15 DIAGNOSIS — R5381 Other malaise: Secondary | ICD-10-CM | POA: Diagnosis not present

## 2022-08-15 DIAGNOSIS — E785 Hyperlipidemia, unspecified: Secondary | ICD-10-CM | POA: Diagnosis not present

## 2022-08-15 DIAGNOSIS — E039 Hypothyroidism, unspecified: Secondary | ICD-10-CM | POA: Diagnosis not present

## 2022-08-15 DIAGNOSIS — R001 Bradycardia, unspecified: Secondary | ICD-10-CM | POA: Diagnosis not present

## 2022-08-15 DIAGNOSIS — N3000 Acute cystitis without hematuria: Secondary | ICD-10-CM | POA: Diagnosis not present

## 2022-08-15 DIAGNOSIS — E538 Deficiency of other specified B group vitamins: Secondary | ICD-10-CM | POA: Diagnosis not present

## 2022-08-15 DIAGNOSIS — Z794 Long term (current) use of insulin: Secondary | ICD-10-CM | POA: Diagnosis not present

## 2022-08-15 DIAGNOSIS — Z741 Need for assistance with personal care: Secondary | ICD-10-CM | POA: Diagnosis not present

## 2022-08-15 DIAGNOSIS — I2510 Atherosclerotic heart disease of native coronary artery without angina pectoris: Secondary | ICD-10-CM | POA: Diagnosis not present

## 2022-08-15 LAB — GLUCOSE, CAPILLARY
Glucose-Capillary: 136 mg/dL — ABNORMAL HIGH (ref 70–99)
Glucose-Capillary: 272 mg/dL — ABNORMAL HIGH (ref 70–99)

## 2022-08-15 MED ORDER — ACETAMINOPHEN 325 MG PO TABS: 650.0000 mg | ORAL_TABLET | Freq: Four times a day (QID) | ORAL | Status: AC | PRN

## 2022-08-15 MED ORDER — DONEPEZIL HCL 10 MG PO TABS: 10.0000 mg | ORAL_TABLET | Freq: Every day | ORAL | Status: AC

## 2022-08-15 MED ORDER — AMOXICILLIN 500 MG PO CAPS: 500.0000 mg | ORAL_CAPSULE | Freq: Three times a day (TID) | ORAL | 0 refills | Status: AC

## 2022-08-15 MED ORDER — SIMVASTATIN 10 MG PO TABS: 10.0000 mg | ORAL_TABLET | Freq: Every day | ORAL | 3 refills | Status: AC

## 2022-08-15 NOTE — Care Management Important Message (Signed)
Important Message  Patient Details  Name: Brianna Weber MRN: [id number] Date of Birth: [birth year]   Medicare Important Message Given:  Yes     Tommy Medal 08/15/2022, 11:28 AM

## 2022-08-15 NOTE — Progress Notes (Signed)
Pt ate 1/2 of her lunch and then assisted to get dressed. Pt placed in wheelchair and discharged to transport [place] for discharge to [facility]. Pt's belongings sent with patient.

## 2022-08-15 NOTE — Progress Notes (Signed)
Patient has been stable this shift.  Patient has had voided and had bowel movement this shift.  No new issues with patient .

## 2022-08-15 NOTE — Discharge Instructions (Signed)
IMPORTANT INFORMATION: PAY CLOSE ATTENTION   PHYSICIAN DISCHARGE INSTRUCTIONS  Follow with Primary care provider  Jacelyn Pi, MD  and other consultants as instructed by your Hospitalist Physician  [place] IF SYMPTOMS COME BACK, WORSEN OR NEW PROBLEM DEVELOPS   Please note: You were cared for by a hospitalist during your hospital stay. Every effort will be made to forward records to your primary care provider.  You can request that your primary care provider send for your hospital records if they have not received them.  Once you are discharged, your primary care physician will handle any further medical issues. Please note that NO REFILLS for any discharge medications will be authorized once you are discharged, as it is imperative that you return to your primary care physician (or establish a relationship with a primary care physician if you do not have one) for your post hospital discharge needs so that they can reassess your need for medications and monitor your lab values.  Please get a complete blood count and chemistry panel checked by your Primary MD at your next visit, and again as instructed by your Primary MD.  Get Medicines reviewed and adjusted: Please take all your medications with you for your next visit with your Primary MD  Laboratory/radiological data: Please request your Primary MD to go over all hospital tests and procedure/radiological results at the follow up, please ask your primary care provider to get all Hospital records sent to his/her office.  In some cases, they will be blood work, cultures and biopsy results pending at the time of your discharge. Please request that your primary care provider follow up on these results.  If you are diabetic, please bring your blood sugar readings with you to your follow up appointment with primary care.    Please call and make your follow up appointments as soon as possible.    Also Note  the following: If you experience worsening of your admission symptoms, develop shortness of breath, life threatening emergency, suicidal or homicidal thoughts you must seek medical attention immediately by calling 911 or calling your MD immediately  if symptoms less severe.  You must read complete instructions/literature along with all the possible adverse reactions/side effects for all the Medicines you take and that have been prescribed to you. Take any new Medicines after you have completely understood and accpet all the possible adverse reactions/side effects.   Do not drive when taking Pain medications or sleeping medications (Benzodiazepines)  Do not take more than prescribed Pain, Sleep and Anxiety Medications. It is not advisable to combine anxiety,sleep and pain medications without talking with your primary care practitioner  Special Instructions: If you have smoked or chewed Tobacco  in the last 2 yrs please stop smoking, stop any regular Alcohol  and or any Recreational drug use.  Wear Seat belts while driving.  Do not drive if taking any narcotic, mind altering or controlled substances or recreational drugs or alcohol.

## 2022-08-15 NOTE — Discharge Summary (Signed)
Physician Discharge Summary  Brianna Weber GLO:[id number] DOB: 22-Jan-1946 DOA: 08/11/2022  PCP: Brianna Pi, MD  Admit date: 08/11/2022 Discharge date: 08/15/2022  Admitted From:  HOME  Disposition:  SNF   Recommendations for Outpatient Follow-up:  Follow up with PCP in 1-2 weeks  Discharge Condition: STABLE   CODE STATUS: FULL   DIET: CARB MODIFIED NO CONCENTRATED SWEETS  Brief Hospitalization Summary: Please see all hospital notes, images, labs for full details of the hospitalization. ADMISSION HPI:   76 y.o. female, with history of bradycardia, breast cancer status post mastectomy, diabetes mellitus, hyperlipidemia, hypertension, coronary disease, hypothyroidism , was brought by her daughter due to confusion, patient with baseline dementia, but she is able to live by herself, per her report mother has been more confused recently, she called 911 few days ago as she thought her home was flooding, patient was diagnosed with UTI 915/2023, she was given Keflex, but it is unclear how compliant patient with her medication as then she was noted to have hypothyroidism with TSH of 270 due to noncompliance with medication, daughter has been calling her mother recently on a daily basis to remind her to take her meds.  Patient can ambulate with a cane, able to take care of himself at home, but daughter noted that patient did not leave her couch, she did soil herself, and she has not been eating or drinking or taking her medications. -ED her work-up was significant for elevated TSH at 47 (down from 270), still having positive urine analysis (most recent UA 9/15 growing E. coli), her potassium was low at 3.4, creatinine at 1.39, UA still significant for pyuria, chest x-ray with no acute findings, hospitalist consulted to admit.    HOSPITAL COURSE BY PROBLEM   Enterococcus UTI  - continue IV ceftriaxone and supportive measures - follow up C&S results --> de-escalated ceftriaxone to amoxicillin x 3  more days to complete course     Type 2 DM - diet controlled  - continue SSI coverage with CBG monitoring CBG (last 3)  Recent Labs    08/14/22 1111 08/14/22 1609 08/15/22 0734  GLUCAP 209* 191* 136*   DIET CONTROLLED    Hypothyroidism - continue daily thyroid supplementation   CAD - stable, resumed home imdur, aspirin   Dementia - delirium precautions - resumed home donzepil   Sinus bradycardia - stable, following, asymptomatic   Discharge Diagnoses:  Principal Problem:   UTI (urinary tract infection) Active Problems:   Hypothyroidism   Essential hypertension   Bradycardia   Type 2 diabetes mellitus ([name])   AMS (altered mental status)   Discharge Instructions:  Allergies as of 08/15/2022   No Known Allergies      Medication List     TAKE these medications    acetaminophen 325 MG tablet Commonly known as: TYLENOL Take 2 tablets (650 mg total) by mouth every 6 (six) hours as needed for mild pain, fever or headache (or Fever >/= 101).   amoxicillin 500 MG capsule Commonly known as: AMOXIL Take 1 capsule (500 mg total) by mouth every 8 (eight) hours for 3 days.   aspirin 81 MG tablet Take 81 mg by mouth daily.   benazepril 40 MG tablet Commonly known as: LOTENSIN Take 1 tablet (40 mg total) by mouth daily.   cholecalciferol 25 MCG (1000 UNIT) tablet Commonly known as: VITAMIN D3 Take 1,000 Units by mouth daily.   cyanocobalamin 500 MCG tablet Commonly known as: VITAMIN B12 Take 500 mcg by mouth daily.  donepezil 10 MG tablet Commonly known as: ARICEPT Take 1 tablet (10 mg total) by mouth daily.   isosorbide mononitrate 60 MG 24 hr tablet Commonly known as: IMDUR Take 1 tablet (60 mg total) by mouth daily.   levothyroxine 150 MCG tablet Commonly known as: SYNTHROID Take 1 tablet (150 mcg total) by mouth daily.   OneTouch Ultra test strip Generic drug: glucose blood CHECK BLOOD SUGAR 2 TIMES A DAY Dx E11.9   Pen Needles 31G X 5 MM  Misc Use to give insulin daily Dx E11.9   simvastatin 10 MG tablet Commonly known as: ZOCOR Take 1 tablet (10 mg total) by mouth daily at 6 PM. What changed: when to take this   vitamin E 200 UNIT capsule Take 200 Units by mouth daily.        Contact information for after-discharge care     Destination     [facility] .   Service: Skilled Nursing Contact information: [address] [place] [phone]                    No Known Allergies Allergies as of 08/15/2022   No Known Allergies      Medication List     TAKE these medications    acetaminophen 325 MG tablet Commonly known as: TYLENOL Take 2 tablets (650 mg total) by mouth every 6 (six) hours as needed for mild pain, fever or headache (or Fever >/= 101).   amoxicillin 500 MG capsule Commonly known as: AMOXIL Take 1 capsule (500 mg total) by mouth every 8 (eight) hours for 3 days.   aspirin 81 MG tablet Take 81 mg by mouth daily.   benazepril 40 MG tablet Commonly known as: LOTENSIN Take 1 tablet (40 mg total) by mouth daily.   cholecalciferol 25 MCG (1000 UNIT) tablet Commonly known as: VITAMIN D3 Take 1,000 Units by mouth daily.   cyanocobalamin 500 MCG tablet Commonly known as: VITAMIN B12 Take 500 mcg by mouth daily.   donepezil 10 MG tablet Commonly known as: ARICEPT Take 1 tablet (10 mg total) by mouth daily.   isosorbide mononitrate 60 MG 24 hr tablet Commonly known as: IMDUR Take 1 tablet (60 mg total) by mouth daily.   levothyroxine 150 MCG tablet Commonly known as: SYNTHROID Take 1 tablet (150 mcg total) by mouth daily.   OneTouch Ultra test strip Generic drug: glucose blood CHECK BLOOD SUGAR 2 TIMES A DAY Dx E11.9   Pen Needles 31G X 5 MM Misc Use to give insulin daily Dx E11.9   simvastatin 10 MG tablet Commonly known as: ZOCOR Take 1 tablet (10 mg total) by mouth daily at 6 PM. What  changed: when to take this   vitamin E 200 UNIT capsule Take 200 Units by mouth daily.        Procedures/Studies: DG Chest Portable 1 View  Result Date: 08/11/2022 CLINICAL DATA:  Bibasilar rales. EXAM: PORTABLE CHEST 1 VIEW COMPARISON:  May 04, 2006. FINDINGS: The heart size and mediastinal contours are within normal limits. Both lungs are clear. The visualized skeletal structures are unremarkable. IMPRESSION: No active disease. Electronically Signed   By: Marijo Conception M.D.   On: 08/11/2022 14:50     Subjective: Pt reports that she is feeling better, agreeable to rehab placement short term only  Discharge Exam: Vitals:   08/14/22 2017 08/15/22 0512  BP: (!) 145/57 125/86  Pulse: (!) 57  68  Resp: 16 15  Temp: 98.5 F (36.9 C) 98.5 F (36.9 C)  SpO2: 98% 94%   Vitals:   08/14/22 0533 08/14/22 1417 08/14/22 2017 08/15/22 0512  BP: (!) 134/59 127/63 (!) 145/57 125/86  Pulse: (!) 59 68 (!) 57 68  Resp: '17 18 16 15  '$ Temp: 98.4 F (36.9 C) 98.5 F (36.9 C) 98.5 F (36.9 C) 98.5 F (36.9 C)  TempSrc:   Oral   SpO2: 93% 97% 98% 94%   General: Pt is alert, awake, not in acute distress Cardiovascular: normal S1/S2 +, no rubs, no gallops Respiratory: CTA bilaterally, no wheezing, no rhonchi Abdominal: Soft, NT, ND, bowel sounds + Extremities: no edema, no cyanosis   The results of significant diagnostics from this hospitalization (including imaging, microbiology, ancillary and laboratory) are listed below for reference.     Microbiology: Recent Results (from the past 240 hour(s))  Blood culture (routine x 2)     Status: None (Preliminary result)   Collection Time: 08/11/22 12:53 PM   Specimen: Right Antecubital; Blood  Result Value Ref Range Status   Specimen Description   Final    RIGHT ANTECUBITAL BOTTLES DRAWN AEROBIC AND ANAEROBIC   Special Requests Blood Culture adequate volume  Final   Culture   Final    NO GROWTH 4 DAYS Performed at [facility],  [address], [place], [place] [address]    Report Status PENDING  Incomplete  Blood culture (routine x 2)     Status: None (Preliminary result)   Collection Time: 08/11/22 12:58 PM   Specimen: Left Antecubital; Blood  Result Value Ref Range Status   Specimen Description   Final    LEFT ANTECUBITAL BOTTLES DRAWN AEROBIC AND ANAEROBIC   Special Requests Blood Culture adequate volume  Final   Culture   Final    NO GROWTH 4 DAYS Performed at [facility], [address], [place], [place] [address]    Report Status PENDING  Incomplete  Urine Culture     Status: Abnormal   Collection Time: 08/11/22  4:05 PM   Specimen: In/Out Cath Urine  Result Value Ref Range Status   Specimen Description   Final    IN/OUT CATH URINE Performed at [facility], [address] [place], [place] [address]    Special Requests   Final    NONE Performed at [facility], [address], [place], [place] [address]    Culture >=100,000 COLONIES/mL ENTEROCOCCUS FAECALIS (A)  Final   Report Status 08/14/2022 FINAL  Final   Organism ID, Bacteria ENTEROCOCCUS FAECALIS (A)  Final      Susceptibility   Enterococcus faecalis - MIC*    AMPICILLIN <=2 SENSITIVE Sensitive     NITROFURANTOIN <=16 SENSITIVE Sensitive     VANCOMYCIN 1 SENSITIVE Sensitive     * >=100,000 COLONIES/mL ENTEROCOCCUS FAECALIS     Labs: BNP (last 3 results) No results for input(s): "BNP" in the last 8760 hours. Basic Metabolic Panel: Recent Labs  Lab 08/11/22 1253 08/12/22 0426  NA 137 138  K 3.4* 3.4*  CL 102 107  CO2 26 25  GLUCOSE 164* 150*  BUN 25* 25*  CREATININE 1.39* 1.25*  CALCIUM 9.3 9.0  MG  --  2.0   Liver Function Tests: Recent Labs  Lab 08/11/22 1253  AST 35  ALT 30  ALKPHOS 99  BILITOT 1.2  PROT 7.4  ALBUMIN 3.8   No results for input(s): "LIPASE", "AMYLASE" in the last 168 hours. No  results for input(s): "AMMONIA" in the last 168 hours. CBC: Recent Labs  Lab 08/11/22 1253 08/12/22 0426  WBC 9.3 9.4   HGB 13.2 12.2  HCT 39.2 36.7  MCV 89.3 89.5  PLT 300 284   Cardiac Enzymes: No results for input(s): "CKTOTAL", "CKMB", "CKMBINDEX", "TROPONINI" in the last 168 hours. BNP: Invalid input(s): "POCBNP" CBG: Recent Labs  Lab 08/13/22 2052 08/14/22 0742 08/14/22 1111 08/14/22 1609 08/15/22 0734  GLUCAP 136* 126* 209* 191* 136*   D-Dimer No results for input(s): "DDIMER" in the last 72 hours. Hgb A1c No results for input(s): "HGBA1C" in the last 72 hours. Lipid Profile No results for input(s): "CHOL", "HDL", "LDLCALC", "TRIG", "CHOLHDL", "LDLDIRECT" in the last 72 hours. Thyroid function studies No results for input(s): "TSH", "T4TOTAL", "T3FREE", "THYROIDAB" in the last 72 hours.  Invalid input(s): "FREET3" Anemia work up No results for input(s): "VITAMINB12", "FOLATE", "FERRITIN", "TIBC", "IRON", "RETICCTPCT" in the last 72 hours. Urinalysis    Component Value Date/Time   COLORURINE YELLOW 08/11/2022 1457   APPEARANCEUR CLOUDY (A) 08/11/2022 1457   APPEARANCEUR Cloudy (A) 07/28/2022 1004   LABSPEC 1.017 08/11/2022 1457   PHURINE 5.0 08/11/2022 1457   GLUCOSEU NEGATIVE 08/11/2022 1457   HGBUR SMALL (A) 08/11/2022 1457   BILIRUBINUR NEGATIVE 08/11/2022 1457   BILIRUBINUR Negative 07/28/2022 1004   KETONESUR 5 (A) 08/11/2022 1457   PROTEINUR 30 (A) 08/11/2022 1457   NITRITE NEGATIVE 08/11/2022 1457   LEUKOCYTESUR LARGE (A) 08/11/2022 1457   Sepsis Labs Recent Labs  Lab 08/11/22 1253 08/12/22 0426  WBC 9.3 9.4   Microbiology Recent Results (from the past 240 hour(s))  Blood culture (routine x 2)     Status: None (Preliminary result)   Collection Time: 08/11/22 12:53 PM   Specimen: Right Antecubital; Blood  Result Value Ref Range Status   Specimen Description   Final    RIGHT ANTECUBITAL BOTTLES DRAWN AEROBIC AND ANAEROBIC   Special Requests Blood Culture adequate volume  Final   Culture   Final    NO GROWTH 4 DAYS Performed at [facility], [address], [place], [place] [address]    Report Status PENDING  Incomplete  Blood culture (routine x 2)     Status: None (Preliminary result)   Collection Time: 08/11/22 12:58 PM   Specimen: Left Antecubital; Blood  Result Value Ref Range Status   Specimen Description   Final    LEFT ANTECUBITAL BOTTLES DRAWN AEROBIC AND ANAEROBIC   Special Requests Blood Culture adequate volume  Final   Culture   Final    NO GROWTH 4 DAYS Performed at [facility], [address] [place], [place] [address]    Report Status PENDING  Incomplete  Urine Culture     Status: Abnormal   Collection Time: 08/11/22  4:05 PM   Specimen: In/Out Cath Urine  Result Value Ref Range Status   Specimen Description   Final    IN/OUT CATH URINE Performed at [facility], [address] [place], [place] [address]    Special Requests   Final    NONE Performed at [facility], [address], [place], [place] [address]    Culture >=100,000 COLONIES/mL ENTEROCOCCUS FAECALIS (A)  Final   Report Status 08/14/2022 FINAL  Final   Organism ID, Bacteria ENTEROCOCCUS FAECALIS (A)  Final      Susceptibility   Enterococcus faecalis - MIC*    AMPICILLIN <=2 SENSITIVE Sensitive     NITROFURANTOIN <=16 SENSITIVE Sensitive  VANCOMYCIN 1 SENSITIVE Sensitive     * >=100,000 COLONIES/mL ENTEROCOCCUS FAECALIS   Time coordinating discharge: 36 mins  SIGNED:  Irwin Brakeman, MD  [facility] 08/15/2022, 10:26 AM How to contact the [facility] Attending or Consulting provider [place] or covering provider during after hours [place], for this patient?  Check the care team in [facility] and look for a) attending/consulting [facility] provider listed and b) the [facility] team listed Log into [email or web address] and use [facility] universal password to access. If you do not have the password, please contact the hospital operator. Locate the [facility] provider you are looking for under [facility] and page to a number that you can be directly reached. If you still  have difficulty reaching the provider, please page the [facility] (Director on Call) for the Hospitalists listed on amion for assistance.

## 2022-08-15 NOTE — TOC Transition Note (Signed)
Transition of Care [facility]) - CM/SW Discharge Note   Patient Details  Name: Brianna Weber MRN: [id number] Date of Birth: 1946-02-26  Transition of [facility] CM/SW Contact:  Ihor Gully, LCSW Phone Number: 08/15/2022, 11:39 AM   Clinical Narrative:    Discharge clinicals sent to facility. [facility] transportation to provide transport. RN to call report. TOC signing off.    Final next level of care: Skilled Nursing Facility Barriers to Discharge: No Barriers Identified   Patient Goals and CMS Choice Patient states their goals for this hospitalization and ongoing recovery are:: to go home. CMS Medicare.gov Compare Post Acute Care list provided to:: Patient    Discharge Placement              Patient chooses bed at: [facility] Patient to be transferred to facility by: [facility] Transportation Name of family member notified: daughter Patient and family notified of of transfer: 08/15/22  Discharge Plan and Services                                     Social Determinants of Health (SDOH) Interventions     Readmission Risk Interventions     No data to display

## 2022-08-16 DIAGNOSIS — I2510 Atherosclerotic heart disease of native coronary artery without angina pectoris: Secondary | ICD-10-CM | POA: Diagnosis not present

## 2022-08-16 DIAGNOSIS — E559 Vitamin D deficiency, unspecified: Secondary | ICD-10-CM | POA: Diagnosis not present

## 2022-08-16 DIAGNOSIS — R5381 Other malaise: Secondary | ICD-10-CM | POA: Diagnosis not present

## 2022-08-16 DIAGNOSIS — E119 Type 2 diabetes mellitus without complications: Secondary | ICD-10-CM | POA: Diagnosis not present

## 2022-08-16 DIAGNOSIS — E538 Deficiency of other specified B group vitamins: Secondary | ICD-10-CM | POA: Diagnosis not present

## 2022-08-16 DIAGNOSIS — I10 Essential (primary) hypertension: Secondary | ICD-10-CM | POA: Diagnosis not present

## 2022-08-16 DIAGNOSIS — N390 Urinary tract infection, site not specified: Secondary | ICD-10-CM | POA: Diagnosis not present

## 2022-08-16 DIAGNOSIS — E039 Hypothyroidism, unspecified: Secondary | ICD-10-CM | POA: Diagnosis not present

## 2022-08-16 LAB — CULTURE, BLOOD (ROUTINE X 2)
Culture: NO GROWTH
Culture: NO GROWTH
Special Requests: ADEQUATE
Special Requests: ADEQUATE

## 2022-08-17 DIAGNOSIS — N390 Urinary tract infection, site not specified: Secondary | ICD-10-CM | POA: Diagnosis not present

## 2022-08-17 DIAGNOSIS — I2510 Atherosclerotic heart disease of native coronary artery without angina pectoris: Secondary | ICD-10-CM | POA: Diagnosis not present

## 2022-08-17 DIAGNOSIS — I10 Essential (primary) hypertension: Secondary | ICD-10-CM | POA: Diagnosis not present

## 2022-08-17 DIAGNOSIS — E119 Type 2 diabetes mellitus without complications: Secondary | ICD-10-CM | POA: Diagnosis not present

## 2022-08-17 DIAGNOSIS — E039 Hypothyroidism, unspecified: Secondary | ICD-10-CM | POA: Diagnosis not present

## 2022-08-17 DIAGNOSIS — R5381 Other malaise: Secondary | ICD-10-CM | POA: Diagnosis not present

## 2022-08-17 DIAGNOSIS — E538 Deficiency of other specified B group vitamins: Secondary | ICD-10-CM | POA: Diagnosis not present

## 2022-08-17 DIAGNOSIS — E559 Vitamin D deficiency, unspecified: Secondary | ICD-10-CM | POA: Diagnosis not present

## 2022-08-23 DIAGNOSIS — D649 Anemia, unspecified: Secondary | ICD-10-CM | POA: Diagnosis not present

## 2022-08-23 DIAGNOSIS — E559 Vitamin D deficiency, unspecified: Secondary | ICD-10-CM | POA: Diagnosis not present

## 2022-08-23 DIAGNOSIS — E119 Type 2 diabetes mellitus without complications: Secondary | ICD-10-CM | POA: Diagnosis not present

## 2022-08-23 DIAGNOSIS — R5381 Other malaise: Secondary | ICD-10-CM | POA: Diagnosis not present

## 2022-08-23 DIAGNOSIS — I10 Essential (primary) hypertension: Secondary | ICD-10-CM | POA: Diagnosis not present

## 2022-08-23 DIAGNOSIS — I2510 Atherosclerotic heart disease of native coronary artery without angina pectoris: Secondary | ICD-10-CM | POA: Diagnosis not present

## 2022-08-23 DIAGNOSIS — N189 Chronic kidney disease, unspecified: Secondary | ICD-10-CM | POA: Diagnosis not present

## 2022-08-23 DIAGNOSIS — E039 Hypothyroidism, unspecified: Secondary | ICD-10-CM | POA: Diagnosis not present

## 2022-08-28 DIAGNOSIS — G301 Alzheimer's disease with late onset: Secondary | ICD-10-CM | POA: Diagnosis not present

## 2022-08-28 DIAGNOSIS — E785 Hyperlipidemia, unspecified: Secondary | ICD-10-CM | POA: Diagnosis not present

## 2022-08-28 DIAGNOSIS — I10 Essential (primary) hypertension: Secondary | ICD-10-CM | POA: Diagnosis not present

## 2022-08-28 DIAGNOSIS — D649 Anemia, unspecified: Secondary | ICD-10-CM | POA: Diagnosis not present

## 2022-08-30 DIAGNOSIS — N189 Chronic kidney disease, unspecified: Secondary | ICD-10-CM | POA: Diagnosis not present

## 2022-08-30 DIAGNOSIS — D649 Anemia, unspecified: Secondary | ICD-10-CM | POA: Diagnosis not present

## 2022-08-30 DIAGNOSIS — I2510 Atherosclerotic heart disease of native coronary artery without angina pectoris: Secondary | ICD-10-CM | POA: Diagnosis not present

## 2022-08-30 DIAGNOSIS — E559 Vitamin D deficiency, unspecified: Secondary | ICD-10-CM | POA: Diagnosis not present

## 2022-08-30 DIAGNOSIS — E119 Type 2 diabetes mellitus without complications: Secondary | ICD-10-CM | POA: Diagnosis not present

## 2022-08-30 DIAGNOSIS — I10 Essential (primary) hypertension: Secondary | ICD-10-CM | POA: Diagnosis not present

## 2022-08-30 DIAGNOSIS — E538 Deficiency of other specified B group vitamins: Secondary | ICD-10-CM | POA: Diagnosis not present

## 2022-08-30 DIAGNOSIS — E039 Hypothyroidism, unspecified: Secondary | ICD-10-CM | POA: Diagnosis not present

## 2022-08-31 DIAGNOSIS — M6281 Muscle weakness (generalized): Secondary | ICD-10-CM | POA: Diagnosis not present

## 2022-08-31 DIAGNOSIS — Z741 Need for assistance with personal care: Secondary | ICD-10-CM | POA: Diagnosis not present

## 2022-09-01 DIAGNOSIS — Z741 Need for assistance with personal care: Secondary | ICD-10-CM | POA: Diagnosis not present

## 2022-09-01 DIAGNOSIS — M6281 Muscle weakness (generalized): Secondary | ICD-10-CM | POA: Diagnosis not present

## 2022-09-02 ENCOUNTER — Other Ambulatory Visit: Payer: Self-pay | Admitting: Family Medicine

## 2022-09-04 ENCOUNTER — Other Ambulatory Visit: Payer: Self-pay | Admitting: Family Medicine

## 2022-09-04 DIAGNOSIS — Z741 Need for assistance with personal care: Secondary | ICD-10-CM | POA: Diagnosis not present

## 2022-09-04 DIAGNOSIS — M6281 Muscle weakness (generalized): Secondary | ICD-10-CM | POA: Diagnosis not present

## 2022-09-05 DIAGNOSIS — Z741 Need for assistance with personal care: Secondary | ICD-10-CM | POA: Diagnosis not present

## 2022-09-05 DIAGNOSIS — M6281 Muscle weakness (generalized): Secondary | ICD-10-CM | POA: Diagnosis not present

## 2022-09-06 ENCOUNTER — Ambulatory Visit: Payer: Medicare Other | Admitting: Family Medicine

## 2022-09-06 DIAGNOSIS — E119 Type 2 diabetes mellitus without complications: Secondary | ICD-10-CM | POA: Diagnosis not present

## 2022-09-06 DIAGNOSIS — E039 Hypothyroidism, unspecified: Secondary | ICD-10-CM | POA: Diagnosis not present

## 2022-09-06 DIAGNOSIS — M6281 Muscle weakness (generalized): Secondary | ICD-10-CM | POA: Diagnosis not present

## 2022-09-06 DIAGNOSIS — Z741 Need for assistance with personal care: Secondary | ICD-10-CM | POA: Diagnosis not present

## 2022-09-06 DIAGNOSIS — D649 Anemia, unspecified: Secondary | ICD-10-CM | POA: Diagnosis not present

## 2022-09-06 DIAGNOSIS — I2510 Atherosclerotic heart disease of native coronary artery without angina pectoris: Secondary | ICD-10-CM | POA: Diagnosis not present

## 2022-09-06 DIAGNOSIS — I10 Essential (primary) hypertension: Secondary | ICD-10-CM | POA: Diagnosis not present

## 2022-09-06 DIAGNOSIS — N189 Chronic kidney disease, unspecified: Secondary | ICD-10-CM | POA: Diagnosis not present

## 2022-09-06 DIAGNOSIS — E559 Vitamin D deficiency, unspecified: Secondary | ICD-10-CM | POA: Diagnosis not present

## 2022-09-06 DIAGNOSIS — R5381 Other malaise: Secondary | ICD-10-CM | POA: Diagnosis not present

## 2022-09-06 DIAGNOSIS — J309 Allergic rhinitis, unspecified: Secondary | ICD-10-CM | POA: Diagnosis not present

## 2022-09-07 DIAGNOSIS — Z741 Need for assistance with personal care: Secondary | ICD-10-CM | POA: Diagnosis not present

## 2022-09-07 DIAGNOSIS — N390 Urinary tract infection, site not specified: Secondary | ICD-10-CM | POA: Diagnosis not present

## 2022-09-07 DIAGNOSIS — M6281 Muscle weakness (generalized): Secondary | ICD-10-CM | POA: Diagnosis not present

## 2022-09-08 DIAGNOSIS — M6281 Muscle weakness (generalized): Secondary | ICD-10-CM | POA: Diagnosis not present

## 2022-09-08 DIAGNOSIS — Z741 Need for assistance with personal care: Secondary | ICD-10-CM | POA: Diagnosis not present

## 2022-09-09 DIAGNOSIS — N390 Urinary tract infection, site not specified: Secondary | ICD-10-CM | POA: Diagnosis not present

## 2022-09-11 DIAGNOSIS — M6281 Muscle weakness (generalized): Secondary | ICD-10-CM | POA: Diagnosis not present

## 2022-09-11 DIAGNOSIS — Z741 Need for assistance with personal care: Secondary | ICD-10-CM | POA: Diagnosis not present

## 2022-09-12 DIAGNOSIS — N390 Urinary tract infection, site not specified: Secondary | ICD-10-CM | POA: Diagnosis not present

## 2022-09-12 DIAGNOSIS — Z741 Need for assistance with personal care: Secondary | ICD-10-CM | POA: Diagnosis not present

## 2022-09-12 DIAGNOSIS — M6281 Muscle weakness (generalized): Secondary | ICD-10-CM | POA: Diagnosis not present

## 2022-09-13 DIAGNOSIS — Z741 Need for assistance with personal care: Secondary | ICD-10-CM | POA: Diagnosis not present

## 2022-09-13 DIAGNOSIS — M6281 Muscle weakness (generalized): Secondary | ICD-10-CM | POA: Diagnosis not present

## 2022-09-14 DIAGNOSIS — M6281 Muscle weakness (generalized): Secondary | ICD-10-CM | POA: Diagnosis not present

## 2022-09-14 DIAGNOSIS — Z741 Need for assistance with personal care: Secondary | ICD-10-CM | POA: Diagnosis not present

## 2022-09-14 DIAGNOSIS — E039 Hypothyroidism, unspecified: Secondary | ICD-10-CM | POA: Diagnosis not present

## 2022-09-14 DIAGNOSIS — J309 Allergic rhinitis, unspecified: Secondary | ICD-10-CM | POA: Diagnosis not present

## 2022-09-14 DIAGNOSIS — R5381 Other malaise: Secondary | ICD-10-CM | POA: Diagnosis not present

## 2022-09-14 DIAGNOSIS — N189 Chronic kidney disease, unspecified: Secondary | ICD-10-CM | POA: Diagnosis not present

## 2022-09-14 DIAGNOSIS — D649 Anemia, unspecified: Secondary | ICD-10-CM | POA: Diagnosis not present

## 2022-09-14 DIAGNOSIS — I2510 Atherosclerotic heart disease of native coronary artery without angina pectoris: Secondary | ICD-10-CM | POA: Diagnosis not present

## 2022-09-14 DIAGNOSIS — I10 Essential (primary) hypertension: Secondary | ICD-10-CM | POA: Diagnosis not present

## 2022-09-14 DIAGNOSIS — E119 Type 2 diabetes mellitus without complications: Secondary | ICD-10-CM | POA: Diagnosis not present

## 2022-09-15 DIAGNOSIS — Z741 Need for assistance with personal care: Secondary | ICD-10-CM | POA: Diagnosis not present

## 2022-09-15 DIAGNOSIS — M6281 Muscle weakness (generalized): Secondary | ICD-10-CM | POA: Diagnosis not present

## 2022-09-18 DIAGNOSIS — Z741 Need for assistance with personal care: Secondary | ICD-10-CM | POA: Diagnosis not present

## 2022-09-18 DIAGNOSIS — D649 Anemia, unspecified: Secondary | ICD-10-CM | POA: Diagnosis not present

## 2022-09-18 DIAGNOSIS — N390 Urinary tract infection, site not specified: Secondary | ICD-10-CM | POA: Diagnosis not present

## 2022-09-18 DIAGNOSIS — M6281 Muscle weakness (generalized): Secondary | ICD-10-CM | POA: Diagnosis not present

## 2022-09-18 DIAGNOSIS — E119 Type 2 diabetes mellitus without complications: Secondary | ICD-10-CM | POA: Diagnosis not present

## 2022-09-18 DIAGNOSIS — N189 Chronic kidney disease, unspecified: Secondary | ICD-10-CM | POA: Diagnosis not present

## 2022-09-18 DIAGNOSIS — I2510 Atherosclerotic heart disease of native coronary artery without angina pectoris: Secondary | ICD-10-CM | POA: Diagnosis not present

## 2022-09-20 DIAGNOSIS — I10 Essential (primary) hypertension: Secondary | ICD-10-CM | POA: Diagnosis not present

## 2022-09-20 DIAGNOSIS — Z741 Need for assistance with personal care: Secondary | ICD-10-CM | POA: Diagnosis not present

## 2022-09-20 DIAGNOSIS — R5381 Other malaise: Secondary | ICD-10-CM | POA: Diagnosis not present

## 2022-09-20 DIAGNOSIS — I2510 Atherosclerotic heart disease of native coronary artery without angina pectoris: Secondary | ICD-10-CM | POA: Diagnosis not present

## 2022-09-20 DIAGNOSIS — R110 Nausea: Secondary | ICD-10-CM | POA: Diagnosis not present

## 2022-09-20 DIAGNOSIS — E119 Type 2 diabetes mellitus without complications: Secondary | ICD-10-CM | POA: Diagnosis not present

## 2022-09-20 DIAGNOSIS — M6281 Muscle weakness (generalized): Secondary | ICD-10-CM | POA: Diagnosis not present

## 2022-09-20 DIAGNOSIS — E039 Hypothyroidism, unspecified: Secondary | ICD-10-CM | POA: Diagnosis not present

## 2022-09-20 DIAGNOSIS — N390 Urinary tract infection, site not specified: Secondary | ICD-10-CM | POA: Diagnosis not present

## 2022-09-20 DIAGNOSIS — E559 Vitamin D deficiency, unspecified: Secondary | ICD-10-CM | POA: Diagnosis not present

## 2022-09-20 DIAGNOSIS — D649 Anemia, unspecified: Secondary | ICD-10-CM | POA: Diagnosis not present

## 2022-09-20 DIAGNOSIS — E538 Deficiency of other specified B group vitamins: Secondary | ICD-10-CM | POA: Diagnosis not present

## 2022-09-22 DIAGNOSIS — Z741 Need for assistance with personal care: Secondary | ICD-10-CM | POA: Diagnosis not present

## 2022-09-22 DIAGNOSIS — M6281 Muscle weakness (generalized): Secondary | ICD-10-CM | POA: Diagnosis not present

## 2022-09-25 DIAGNOSIS — R109 Unspecified abdominal pain: Secondary | ICD-10-CM | POA: Diagnosis not present

## 2022-09-25 DIAGNOSIS — E119 Type 2 diabetes mellitus without complications: Secondary | ICD-10-CM | POA: Diagnosis not present

## 2022-09-25 DIAGNOSIS — K219 Gastro-esophageal reflux disease without esophagitis: Secondary | ICD-10-CM | POA: Diagnosis not present

## 2022-09-25 DIAGNOSIS — N189 Chronic kidney disease, unspecified: Secondary | ICD-10-CM | POA: Diagnosis not present

## 2022-09-25 DIAGNOSIS — I2510 Atherosclerotic heart disease of native coronary artery without angina pectoris: Secondary | ICD-10-CM | POA: Diagnosis not present

## 2022-09-25 DIAGNOSIS — E559 Vitamin D deficiency, unspecified: Secondary | ICD-10-CM | POA: Diagnosis not present

## 2022-09-25 DIAGNOSIS — Z741 Need for assistance with personal care: Secondary | ICD-10-CM | POA: Diagnosis not present

## 2022-09-25 DIAGNOSIS — M6281 Muscle weakness (generalized): Secondary | ICD-10-CM | POA: Diagnosis not present

## 2022-09-25 DIAGNOSIS — E039 Hypothyroidism, unspecified: Secondary | ICD-10-CM | POA: Diagnosis not present

## 2022-09-25 DIAGNOSIS — D649 Anemia, unspecified: Secondary | ICD-10-CM | POA: Diagnosis not present

## 2022-09-26 DIAGNOSIS — Z741 Need for assistance with personal care: Secondary | ICD-10-CM | POA: Diagnosis not present

## 2022-09-26 DIAGNOSIS — M6281 Muscle weakness (generalized): Secondary | ICD-10-CM | POA: Diagnosis not present

## 2022-09-26 DIAGNOSIS — I10 Essential (primary) hypertension: Secondary | ICD-10-CM | POA: Diagnosis not present

## 2022-09-26 DIAGNOSIS — N390 Urinary tract infection, site not specified: Secondary | ICD-10-CM | POA: Diagnosis not present

## 2022-09-27 DIAGNOSIS — M6281 Muscle weakness (generalized): Secondary | ICD-10-CM | POA: Diagnosis not present

## 2022-09-27 DIAGNOSIS — E039 Hypothyroidism, unspecified: Secondary | ICD-10-CM | POA: Diagnosis not present

## 2022-09-27 DIAGNOSIS — Z741 Need for assistance with personal care: Secondary | ICD-10-CM | POA: Diagnosis not present

## 2022-09-28 DIAGNOSIS — E559 Vitamin D deficiency, unspecified: Secondary | ICD-10-CM | POA: Diagnosis not present

## 2022-09-28 DIAGNOSIS — K219 Gastro-esophageal reflux disease without esophagitis: Secondary | ICD-10-CM | POA: Diagnosis not present

## 2022-09-28 DIAGNOSIS — E538 Deficiency of other specified B group vitamins: Secondary | ICD-10-CM | POA: Diagnosis not present

## 2022-09-28 DIAGNOSIS — F32A Depression, unspecified: Secondary | ICD-10-CM | POA: Diagnosis not present

## 2022-09-28 DIAGNOSIS — R110 Nausea: Secondary | ICD-10-CM | POA: Diagnosis not present

## 2022-09-28 DIAGNOSIS — I10 Essential (primary) hypertension: Secondary | ICD-10-CM | POA: Diagnosis not present

## 2022-09-28 DIAGNOSIS — R100 Acute abdomen: Secondary | ICD-10-CM | POA: Diagnosis not present

## 2022-09-28 DIAGNOSIS — N189 Chronic kidney disease, unspecified: Secondary | ICD-10-CM | POA: Diagnosis not present

## 2022-09-28 DIAGNOSIS — I2510 Atherosclerotic heart disease of native coronary artery without angina pectoris: Secondary | ICD-10-CM | POA: Diagnosis not present

## 2022-09-29 DIAGNOSIS — Z741 Need for assistance with personal care: Secondary | ICD-10-CM | POA: Diagnosis not present

## 2022-09-29 DIAGNOSIS — M6281 Muscle weakness (generalized): Secondary | ICD-10-CM | POA: Diagnosis not present

## 2022-10-02 DIAGNOSIS — Z741 Need for assistance with personal care: Secondary | ICD-10-CM | POA: Diagnosis not present

## 2022-10-02 DIAGNOSIS — D649 Anemia, unspecified: Secondary | ICD-10-CM | POA: Diagnosis not present

## 2022-10-02 DIAGNOSIS — R638 Other symptoms and signs concerning food and fluid intake: Secondary | ICD-10-CM | POA: Diagnosis not present

## 2022-10-02 DIAGNOSIS — I2510 Atherosclerotic heart disease of native coronary artery without angina pectoris: Secondary | ICD-10-CM | POA: Diagnosis not present

## 2022-10-02 DIAGNOSIS — E039 Hypothyroidism, unspecified: Secondary | ICD-10-CM | POA: Diagnosis not present

## 2022-10-02 DIAGNOSIS — E559 Vitamin D deficiency, unspecified: Secondary | ICD-10-CM | POA: Diagnosis not present

## 2022-10-02 DIAGNOSIS — E538 Deficiency of other specified B group vitamins: Secondary | ICD-10-CM | POA: Diagnosis not present

## 2022-10-02 DIAGNOSIS — E119 Type 2 diabetes mellitus without complications: Secondary | ICD-10-CM | POA: Diagnosis not present

## 2022-10-02 DIAGNOSIS — K219 Gastro-esophageal reflux disease without esophagitis: Secondary | ICD-10-CM | POA: Diagnosis not present

## 2022-10-02 DIAGNOSIS — I10 Essential (primary) hypertension: Secondary | ICD-10-CM | POA: Diagnosis not present

## 2022-10-02 DIAGNOSIS — M6281 Muscle weakness (generalized): Secondary | ICD-10-CM | POA: Diagnosis not present

## 2022-10-03 DIAGNOSIS — K219 Gastro-esophageal reflux disease without esophagitis: Secondary | ICD-10-CM | POA: Diagnosis not present

## 2022-10-03 DIAGNOSIS — M6281 Muscle weakness (generalized): Secondary | ICD-10-CM | POA: Diagnosis not present

## 2022-10-03 DIAGNOSIS — Z741 Need for assistance with personal care: Secondary | ICD-10-CM | POA: Diagnosis not present

## 2022-10-03 DIAGNOSIS — D649 Anemia, unspecified: Secondary | ICD-10-CM | POA: Diagnosis not present

## 2022-10-03 DIAGNOSIS — R638 Other symptoms and signs concerning food and fluid intake: Secondary | ICD-10-CM | POA: Diagnosis not present

## 2022-10-03 DIAGNOSIS — I2510 Atherosclerotic heart disease of native coronary artery without angina pectoris: Secondary | ICD-10-CM | POA: Diagnosis not present

## 2022-10-03 DIAGNOSIS — E559 Vitamin D deficiency, unspecified: Secondary | ICD-10-CM | POA: Diagnosis not present

## 2022-10-03 DIAGNOSIS — E039 Hypothyroidism, unspecified: Secondary | ICD-10-CM | POA: Diagnosis not present

## 2022-10-03 DIAGNOSIS — E119 Type 2 diabetes mellitus without complications: Secondary | ICD-10-CM | POA: Diagnosis not present

## 2022-10-03 DIAGNOSIS — I10 Essential (primary) hypertension: Secondary | ICD-10-CM | POA: Diagnosis not present

## 2022-10-03 DIAGNOSIS — R5381 Other malaise: Secondary | ICD-10-CM | POA: Diagnosis not present

## 2022-10-04 DIAGNOSIS — Z741 Need for assistance with personal care: Secondary | ICD-10-CM | POA: Diagnosis not present

## 2022-10-04 DIAGNOSIS — M6281 Muscle weakness (generalized): Secondary | ICD-10-CM | POA: Diagnosis not present

## 2022-10-06 DIAGNOSIS — M6281 Muscle weakness (generalized): Secondary | ICD-10-CM | POA: Diagnosis not present

## 2022-10-06 DIAGNOSIS — Z741 Need for assistance with personal care: Secondary | ICD-10-CM | POA: Diagnosis not present

## 2022-10-07 DIAGNOSIS — M6281 Muscle weakness (generalized): Secondary | ICD-10-CM | POA: Diagnosis not present

## 2022-10-07 DIAGNOSIS — Z741 Need for assistance with personal care: Secondary | ICD-10-CM | POA: Diagnosis not present

## 2022-10-09 DIAGNOSIS — Z741 Need for assistance with personal care: Secondary | ICD-10-CM | POA: Diagnosis not present

## 2022-10-09 DIAGNOSIS — M6281 Muscle weakness (generalized): Secondary | ICD-10-CM | POA: Diagnosis not present

## 2022-10-10 DIAGNOSIS — E039 Hypothyroidism, unspecified: Secondary | ICD-10-CM | POA: Diagnosis not present

## 2022-10-10 DIAGNOSIS — M6281 Muscle weakness (generalized): Secondary | ICD-10-CM | POA: Diagnosis not present

## 2022-10-10 DIAGNOSIS — E119 Type 2 diabetes mellitus without complications: Secondary | ICD-10-CM | POA: Diagnosis not present

## 2022-10-10 DIAGNOSIS — Z741 Need for assistance with personal care: Secondary | ICD-10-CM | POA: Diagnosis not present

## 2022-10-11 DIAGNOSIS — G301 Alzheimer's disease with late onset: Secondary | ICD-10-CM | POA: Diagnosis not present

## 2022-10-11 DIAGNOSIS — E039 Hypothyroidism, unspecified: Secondary | ICD-10-CM | POA: Diagnosis not present

## 2022-10-11 DIAGNOSIS — E785 Hyperlipidemia, unspecified: Secondary | ICD-10-CM | POA: Diagnosis not present

## 2022-10-11 DIAGNOSIS — M6281 Muscle weakness (generalized): Secondary | ICD-10-CM | POA: Diagnosis not present

## 2022-10-11 DIAGNOSIS — Z741 Need for assistance with personal care: Secondary | ICD-10-CM | POA: Diagnosis not present

## 2022-10-11 DIAGNOSIS — I10 Essential (primary) hypertension: Secondary | ICD-10-CM | POA: Diagnosis not present

## 2022-10-11 DIAGNOSIS — D649 Anemia, unspecified: Secondary | ICD-10-CM | POA: Diagnosis not present

## 2022-10-11 DIAGNOSIS — E119 Type 2 diabetes mellitus without complications: Secondary | ICD-10-CM | POA: Diagnosis not present

## 2022-10-12 ENCOUNTER — Ambulatory Visit: Payer: Medicare Other | Admitting: Physician Assistant

## 2022-10-12 ENCOUNTER — Encounter: Payer: Self-pay | Admitting: Physician Assistant

## 2022-10-12 VITALS — BP 148/71 | HR 109 | Resp 20 | Ht 64.0 in

## 2022-10-12 DIAGNOSIS — Z741 Need for assistance with personal care: Secondary | ICD-10-CM | POA: Diagnosis not present

## 2022-10-12 DIAGNOSIS — M6281 Muscle weakness (generalized): Secondary | ICD-10-CM | POA: Diagnosis not present

## 2022-10-12 DIAGNOSIS — F0280 Dementia in other diseases classified elsewhere without behavioral disturbance: Secondary | ICD-10-CM

## 2022-10-12 DIAGNOSIS — R296 Repeated falls: Secondary | ICD-10-CM | POA: Diagnosis not present

## 2022-10-12 DIAGNOSIS — F0150 Vascular dementia without behavioral disturbance: Secondary | ICD-10-CM

## 2022-10-12 DIAGNOSIS — G309 Alzheimer's disease, unspecified: Secondary | ICD-10-CM | POA: Diagnosis not present

## 2022-10-12 NOTE — Patient Instructions (Addendum)
It was a pleasure to see you today at our office.   Recommendations:  Continue donepezil 10 mg daily. Continue to monitor the thyroid  Follow up in  6 months For assessment of decision of mental capacity and competency:  Call Dr. Anthoney Harada, geriatric psychiatrist at [phone]  Feel free to visit Facebook page " [email or web address]" for tips of how to care for people with memory problems.   Whom to call:  Memory  decline, memory medications: Call out office [phone]   For psychiatric meds, mood meds: Please have your primary care physician manage these medications.     If you have any severe symptoms of a stroke, or other severe issues such as confusion,severe chills or fever, etc call 911 or go to the ER as you may need to be evaluate further       RECOMMENDATIONS FOR ALL PATIENTS WITH MEMORY PROBLEMS: 1. Continue to exercise (Recommend 30 minutes of walking everyday, or 3 hours every week) 2. Increase social interactions - continue going to [place] and enjoy social gatherings with friends and family 3. Eat healthy, avoid fried foods and eat more fruits and vegetables 4. Maintain adequate blood pressure, blood sugar, and blood cholesterol level. Reducing the risk of stroke and cardiovascular disease also helps promoting better memory. 5. Avoid stressful situations. Live a simple life and avoid aggravations. Organize your time and prepare for the next day in anticipation. 6. Sleep well, avoid any interruptions of sleep and avoid any distractions in the bedroom that may interfere with adequate sleep quality 7. Avoid sugar, avoid sweets as there is a strong link between excessive sugar intake, diabetes, and cognitive impairment We discussed the Mediterranean diet, which has been shown to help patients reduce the risk of progressive memory disorders and reduces cardiovascular risk. This includes eating fish, eat fruits and green leafy vegetables, nuts like almonds and hazelnuts,  walnuts, and also use olive oil. Avoid fast foods and fried foods as much as possible. Avoid sweets and sugar as sugar use has been linked to worsening of memory function.  There is always a concern of gradual progression of memory problems. If this is the case, then we may need to adjust level of care according to patient needs. Support, both to the patient and caregiver, should then be put into place.    The Alzheimer's Association is here all day, every day for people facing Alzheimer's disease through our free 24/7 Helpline: [phone]. The Helpline provides reliable information and support to all those who need assistance, such as individuals living with memory loss, Alzheimer's or other dementia, caregivers, health care professionals and the public.  Our highly trained and knowledgeable staff can help you with: Understanding memory loss, dementia and Alzheimer's  Medications and other treatment options  General information about aging and brain health  Skills to provide quality care and to find the best care from professionals  Legal, financial and living-arrangement decisions Our Helpline also features: Confidential care consultation provided by master's level clinicians who can help with decision-making support, crisis assistance and education on issues families face every day  Help in a caller's preferred language using our translation service that features more than 200 languages and dialects  Referrals to local community programs, services and ongoing support     FALL PRECAUTIONS: Be cautious when walking. Scan the area for obstacles that may increase the risk of trips and falls. When getting up in the mornings, sit up at the edge of the bed for  a few minutes before getting out of bed. Consider elevating the bed at the head end to avoid drop of blood pressure when getting up. Walk always in a well-lit room (use night lights in the walls). Avoid area rugs or power cords from  appliances in the middle of the walkways. Use a walker or a cane if necessary and consider physical therapy for balance exercise. Get your eyesight checked regularly.  FINANCIAL OVERSIGHT: Supervision, especially oversight when making financial decisions or transactions is also recommended.  HOME SAFETY: Consider the safety of the kitchen when operating appliances like stoves, microwave oven, and blender. Consider having supervision and share cooking responsibilities until no longer able to participate in those. Accidents with firearms and other hazards in the house should be identified and addressed as well.   ABILITY TO BE LEFT ALONE: If patient is unable to contact 911 operator, consider using LifeLine, or when the need is there, arrange for someone to stay with patients. Smoking is a fire hazard, consider supervision or cessation. Risk of wandering should be assessed by caregiver and if detected at any point, supervision and safe proof recommendations should be instituted.  MEDICATION SUPERVISION: Inability to self-administer medication needs to be constantly addressed. Implement a mechanism to ensure safe administration of the medications.     Mediterranean Diet A Mediterranean diet refers to food and lifestyle choices that are based on the traditions of countries located on the [employer]. This way of eating has been shown to help prevent certain conditions and improve outcomes for people who have chronic diseases, like kidney disease and heart disease. What are tips for following this plan? Lifestyle  Cook and eat meals together with your family, when possible. Drink enough fluid to keep your urine clear or pale yellow. Be physically active every day. This includes: Aerobic exercise like running or swimming. Leisure activities like gardening, walking, or housework. Get 7-8 hours of sleep each night. If recommended by your health care provider, drink red wine in moderation. This  means 1 glass a day for nonpregnant women and 2 glasses a day for men. A glass of wine equals 5 oz (150 mL). Reading food labels  Check the serving size of packaged foods. For foods such as rice and pasta, the serving size refers to the amount of cooked product, not dry. Check the total fat in packaged foods. Avoid foods that have saturated fat or trans fats. Check the ingredients list for added sugars, such as corn syrup. Shopping  At the grocery store, buy most of your food from the areas near the walls of the store. This includes: Fresh fruits and vegetables (produce). Grains, beans, nuts, and seeds. Some of these may be available in unpackaged forms or large amounts (in bulk). Fresh seafood. Poultry and eggs. Low-fat dairy products. Buy whole ingredients instead of prepackaged foods. Buy fresh fruits and vegetables in-season from local farmers markets. Buy frozen fruits and vegetables in resealable bags. If you do not have access to quality fresh seafood, buy precooked frozen shrimp or canned fish, such as tuna, salmon, or sardines. Buy small amounts of raw or cooked vegetables, salads, or olives from the deli or salad bar at your store. Stock your pantry so you always have certain foods on hand, such as olive oil, canned tuna, canned tomatoes, rice, pasta, and beans. Cooking  Cook foods with extra-virgin olive oil instead of using butter or other vegetable oils. Have meat as a side dish, and have vegetables or grains as  your main dish. This means having meat in small portions or adding small amounts of meat to foods like pasta or stew. Use beans or vegetables instead of meat in common dishes like chili or lasagna. Experiment with different cooking methods. Try roasting or broiling vegetables instead of steaming or sauteing them. Add frozen vegetables to soups, stews, pasta, or rice. Add nuts or seeds for added healthy fat at each meal. You can add these to yogurt, salads, or vegetable  dishes. Marinate fish or vegetables using olive oil, lemon juice, garlic, and fresh herbs. Meal planning  Plan to eat 1 vegetarian meal one day each week. Try to work up to 2 vegetarian meals, if possible. Eat seafood 2 or more times a week. Have healthy snacks readily available, such as: Vegetable sticks with hummus. Greek yogurt. Fruit and nut trail mix. Eat balanced meals throughout the week. This includes: Fruit: 2-3 servings a day Vegetables: 4-5 servings a day Low-fat dairy: 2 servings a day Fish, poultry, or lean meat: 1 serving a day Beans and legumes: 2 or more servings a week Nuts and seeds: 1-2 servings a day Whole grains: 6-8 servings a day Extra-virgin olive oil: 3-4 servings a day Limit red meat and sweets to only a few servings a month What are my food choices? Mediterranean diet Recommended Grains: Whole-grain pasta. Brown rice. Bulgar wheat. Polenta. Couscous. Whole-wheat bread. [place]. Vegetables: Artichokes. Beets. Broccoli. Cabbage. Carrots. Eggplant. Green beans. Chard. Kale. Spinach. Onions. Leeks. Peas. Squash. Tomatoes. Peppers. Radishes. Fruits: Apples. Apricots. Avocado. Berries. Bananas. Cherries. Dates. Figs. Grapes. Lemons. Melon. Oranges. Peaches. Plums. Pomegranate. Meats and other protein foods: Beans. Almonds. Sunflower seeds. Pine nuts. Peanuts. [place]. Salmon. Scallops. Shrimp. [place]. Tilapia. Clams. Oysters. Eggs. Dairy: Low-fat milk. Cheese. Greek yogurt. Beverages: Water. Red wine. Herbal tea. Fats and oils: Extra virgin olive oil. Avocado oil. Grape seed oil. Sweets and desserts: [place] yogurt with honey. Baked apples. Poached pears. Trail mix. Seasoning and other foods: Basil. Cilantro. Coriander. Cumin. Mint. Parsley. Sage. Rosemary. Tarragon. Garlic. Oregano. Thyme. Pepper. Balsalmic vinegar. Tahini. Hummus. Tomato sauce. Olives. Mushrooms. Limit these Grains: Prepackaged pasta or rice dishes. Prepackaged cereal with added  sugar. Vegetables: Deep fried potatoes (french fries). Fruits: Fruit canned in syrup. Meats and other protein foods: Beef. Pork. Lamb. Poultry with skin. Hot dogs. [place] Salines. Dairy: Ice cream. Sour cream. Whole milk. Beverages: Juice. Sugar-sweetened soft drinks. Beer. Liquor and spirits. Fats and oils: Butter. [place] oil. Vegetable oil. Beef fat (tallow). Lard. Sweets and desserts: Cookies. Cakes. Pies. Candy. Seasoning and other foods: Mayonnaise. Premade sauces and marinades. The items listed may not be a complete list. Talk with your dietitian about what dietary choices are right for you. Summary The Mediterranean diet includes both food and lifestyle choices. Eat a variety of fresh fruits and vegetables, beans, nuts, seeds, and whole grains. Limit the amount of red meat and sweets that you eat. Talk with your health care provider about whether it is safe for you to drink red wine in moderation. This means 1 glass a day for nonpregnant women and 2 glasses a day for men. A glass of wine equals 5 oz (150 mL). This information is not intended to replace advice given to you by your health care provider. Make sure you discuss any questions you have with your health care provider. Document Released: 06/22/2016 Document Revised: 07/25/2016 Document Reviewed: 06/22/2016 Elsevier Interactive Patient Education  2017 [employer].

## 2022-10-12 NOTE — Progress Notes (Signed)
Assessment/Plan:   Dementia likely due to Alzheimer's Disease  Brianna Weber is a very pleasant 76 y.o. RH female seen today in follow up for memory loss. Patient is currently on donepezil 10 mg daily.  03/30/2022 MRI brain personally reviewed was remarkable for progression of atrophy and chronic microvascular ischemic changes. There is a slight progression of her memory decline. She is now in ALF for safety and 24/7 monitoring.    Follow up in 6  months. Continue donepezil 10 mg daily, side effects discussed Continue to control mood as per PCP Continue to control thyroid levels as per PCP Recommend good control of cardiovascular risk factors.       Subjective:    This patient is accompanied in the office by her daughters who supplements the history.  Previous records as well as any outside records available were reviewed prior to todays visit.  She was last seen on 04/11/2022.  Most recent MoCA was 14/30   Any changes in memory since last visit?" Not all that good, remember all I have to,try to not overload my brain" She is in ALF since 10/3 , trying to adapt . She does word searches, bingo, karaoke. "Since the thyroid is better she is doing better"-daughter says.  repeats oneself?  Endorsed "I read, crochet, pay bills by myself " Disoriented when walking into a room?  Patient denies   Leaving objects in unusual places?  Patient denies   Ambulates  with difficulty? Walks with a walker but she also uses wheelchair.  Recent falls?Last night she had a mechanical fall while picking up an object from the floor.  Any head injuries?  Patient denies   History of seizures?   Patient denies   Wandering behavior? "They catch her before she goes too far from the room"-daughters say  Patient drives?   Patient no longer drives  Any mood changes since last visit?" She is doing better" since moving to AlF. She participates in activities.   Any worsening depression?:  Patient denies    Hallucinations?  Patient denies   Paranoia?  Patient denies   Patient reports that sleeps well without vivid dreams, REM behavior or sleepwalking   History of sleep apnea?  Patient denies   Any hygiene concerns?  Patient denies   Independent of bathing and dressing?  She may need some assistance Does the patient needs help with medications?  ALF in charge  Who is in charge of the finances? Daughters in charge   Any changes in appetite?  Decreased, so she drinks supplements " I thought I was eating good"-she says   Patient have trouble swallowing? Patient denies   Does the patient cook?  Patient denies   Any kitchen accidents such as leaving the stove on? Patient denies   Any headaches?  Patient denies   Double vision? Patient denies   Any focal numbness or tingling?  Patient denies   Chronic back pain Patient denies   Unilateral weakness?  Patient denies   Any tremors?  Patient denies   Any history of anosmia?  Patient denies   Any incontinence of urine? She had some UTIs x1 requiring hospitalizations in the past, last one 2 weeks ago.  Any bowel dysfunction?   Patient denies      Patient lives with: ALF    PREVIOUS MEDICATIONS:   CURRENT MEDICATIONS:  Outpatient Encounter Medications as of 10/12/2022  Medication Sig   acetaminophen (TYLENOL) 325 MG tablet Take 2 tablets (650 mg total)  by mouth every 6 (six) hours as needed for mild pain, fever or headache (or Fever >/= 101).   aspirin 81 MG tablet Take 81 mg by mouth daily.   benazepril (LOTENSIN) 40 MG tablet Take 1 tablet (40 mg total) by mouth daily.   cholecalciferol (VITAMIN D3) 25 MCG (1000 UNIT) tablet Take 1,000 Units by mouth daily.   donepezil (ARICEPT) 10 MG tablet Take 1 tablet (10 mg total) by mouth daily.   glucose blood (ONETOUCH ULTRA) test strip CHECK BLOOD SUGAR 2 TIMES A DAY Dx E11.9   Insulin Pen Needle (PEN NEEDLES) 31G X 5 MM MISC Use to give insulin daily Dx E11.9   isosorbide mononitrate (IMDUR) 60 MG 24  hr tablet Take 1 tablet (60 mg total) by mouth daily.   levothyroxine (SYNTHROID) 150 MCG tablet Take 1 tablet (150 mcg total) by mouth daily before breakfast. (NEEDS TO BE SEEN BEFORE NEXT REFILL)   simvastatin (ZOCOR) 10 MG tablet Take 1 tablet (10 mg total) by mouth daily at 6 PM.   vitamin B-12 (CYANOCOBALAMIN) 500 MCG tablet Take 500 mcg by mouth daily.   vitamin E 200 UNIT capsule Take 200 Units by mouth daily.   No facility-administered encounter medications on file as of 10/12/2022.       08/03/2022    3:00 PM 07/01/2021   12:33 PM 11/15/2018    9:00 AM  MMSE - Mini Mental State Exam  Orientation to time '3 3 4  '$ Orientation to Place '2 3 5  '$ Registration '3 3 3  '$ Attention/ Calculation '1 4 4  '$ Recall 0 2 0  Language- name 2 objects '2 2 2  '$ Language- repeat '1 1 1  '$ Language- follow 3 step command '2 2 3  '$ Language- read & follow direction '1 1 1  '$ Write a sentence 0 1 1  Copy design 0 1 1  Total score '15 23 25      '$ 03/09/2022   10:00 AM 06/04/2019    2:00 PM  Montreal Cognitive Assessment   Visuospatial/ Executive (0/5) 2   Naming (0/3) 2   Attention: Read list of digits (0/2) 2 1  Attention: Read list of letters (0/1) 0 1  Attention: Serial 7 subtraction starting at 100 (0/3) 0 0  Language: Repeat phrase (0/2) 1 0  Language : Fluency (0/1) 0 1  Abstraction (0/2) 2 2  Delayed Recall (0/5) 0 0  Orientation (0/6) 4 5  Total 13   Adjusted Score (based on education) 14     Objective:     PHYSICAL EXAMINATION:    VITALS:   Vitals:   10/12/22 1417  BP: (!) 148/71  Pulse: (!) 109  Resp: 20  SpO2: 100%  Height: '5\' 4"'$  (1.626 m)    GEN:  The patient appears stated age and is in NAD. HEENT:  Normocephalic, atraumatic.   Neurological examination:  General: NAD, well-groomed, appears stated age. Orientation: The patient is alert. Oriented to person, place and not to date Cranial nerves: There is good facial symmetry.The speech is fluent and clear. No aphasia or  dysarthria. Fund of knowledge is appropriate. Recent and remote memory are impaired. Attention and concentration are reduced.  Able to name objects and repeat phrases.  Hearing is intact to conversational tone.    Sensation: Sensation is intact to light touch throughout Motor: Strength is at least antigravity x4. DTR's 2/4 in UE/LE     Movement examination: Tone: There is normal tone in the UE/LE Abnormal movements:  no tremor.  No myoclonus.  No asterixis.   Coordination:  There is no decremation with RAM's. Abnormal finger to nose  Gait and Station: The patient is on a wheelchair, gait not tested    Thank you for allowing [place] the opportunity to participate in the care of this nice patient. Please do not hesitate to contact us for any questions or concerns.   Total time spent on today's visit was 25 minutes dedicated to this patient today, preparing to see patient, examining the patient, ordering tests and/or medications and counseling the patient, documenting clinical information in the EHR or other health record, independently interpreting results and communicating results to the patient/family, discussing treatment and goals, answering patient's questions and coordinating care.  Cc:  Jacelyn Pi, MD  Sharene Butters 10/14/2022 11:38 AM

## 2022-10-13 DIAGNOSIS — M6281 Muscle weakness (generalized): Secondary | ICD-10-CM | POA: Diagnosis not present

## 2022-10-15 DIAGNOSIS — M6281 Muscle weakness (generalized): Secondary | ICD-10-CM | POA: Diagnosis not present

## 2022-10-17 DIAGNOSIS — M6281 Muscle weakness (generalized): Secondary | ICD-10-CM | POA: Diagnosis not present

## 2022-10-18 DIAGNOSIS — M6281 Muscle weakness (generalized): Secondary | ICD-10-CM | POA: Diagnosis not present

## 2022-10-19 DIAGNOSIS — M6281 Muscle weakness (generalized): Secondary | ICD-10-CM | POA: Diagnosis not present

## 2022-10-20 DIAGNOSIS — M6281 Muscle weakness (generalized): Secondary | ICD-10-CM | POA: Diagnosis not present

## 2022-10-23 DIAGNOSIS — M6281 Muscle weakness (generalized): Secondary | ICD-10-CM | POA: Diagnosis not present

## 2022-10-24 DIAGNOSIS — M6281 Muscle weakness (generalized): Secondary | ICD-10-CM | POA: Diagnosis not present

## 2022-10-25 DIAGNOSIS — M6281 Muscle weakness (generalized): Secondary | ICD-10-CM | POA: Diagnosis not present

## 2022-10-26 DIAGNOSIS — M6281 Muscle weakness (generalized): Secondary | ICD-10-CM | POA: Diagnosis not present

## 2022-10-27 DIAGNOSIS — M6281 Muscle weakness (generalized): Secondary | ICD-10-CM | POA: Diagnosis not present

## 2022-10-30 DIAGNOSIS — M6281 Muscle weakness (generalized): Secondary | ICD-10-CM | POA: Diagnosis not present

## 2022-10-31 DIAGNOSIS — K219 Gastro-esophageal reflux disease without esophagitis: Secondary | ICD-10-CM | POA: Diagnosis not present

## 2022-10-31 DIAGNOSIS — E119 Type 2 diabetes mellitus without complications: Secondary | ICD-10-CM | POA: Diagnosis not present

## 2022-10-31 DIAGNOSIS — E039 Hypothyroidism, unspecified: Secondary | ICD-10-CM | POA: Diagnosis not present

## 2022-10-31 DIAGNOSIS — M6281 Muscle weakness (generalized): Secondary | ICD-10-CM | POA: Diagnosis not present

## 2022-10-31 DIAGNOSIS — F32A Depression, unspecified: Secondary | ICD-10-CM | POA: Diagnosis not present

## 2022-10-31 DIAGNOSIS — I2510 Atherosclerotic heart disease of native coronary artery without angina pectoris: Secondary | ICD-10-CM | POA: Diagnosis not present

## 2022-11-01 DIAGNOSIS — J309 Allergic rhinitis, unspecified: Secondary | ICD-10-CM | POA: Diagnosis not present

## 2022-11-01 DIAGNOSIS — I2510 Atherosclerotic heart disease of native coronary artery without angina pectoris: Secondary | ICD-10-CM | POA: Diagnosis not present

## 2022-11-01 DIAGNOSIS — K219 Gastro-esophageal reflux disease without esophagitis: Secondary | ICD-10-CM | POA: Diagnosis not present

## 2022-11-01 DIAGNOSIS — R5381 Other malaise: Secondary | ICD-10-CM | POA: Diagnosis not present

## 2022-11-01 DIAGNOSIS — I10 Essential (primary) hypertension: Secondary | ICD-10-CM | POA: Diagnosis not present

## 2022-11-01 DIAGNOSIS — R195 Other fecal abnormalities: Secondary | ICD-10-CM | POA: Diagnosis not present

## 2022-11-01 DIAGNOSIS — E039 Hypothyroidism, unspecified: Secondary | ICD-10-CM | POA: Diagnosis not present

## 2022-11-01 DIAGNOSIS — M6281 Muscle weakness (generalized): Secondary | ICD-10-CM | POA: Diagnosis not present

## 2022-11-01 DIAGNOSIS — D649 Anemia, unspecified: Secondary | ICD-10-CM | POA: Diagnosis not present

## 2022-11-01 DIAGNOSIS — E119 Type 2 diabetes mellitus without complications: Secondary | ICD-10-CM | POA: Diagnosis not present

## 2022-11-01 DIAGNOSIS — E559 Vitamin D deficiency, unspecified: Secondary | ICD-10-CM | POA: Diagnosis not present

## 2022-11-01 DIAGNOSIS — N189 Chronic kidney disease, unspecified: Secondary | ICD-10-CM | POA: Diagnosis not present

## 2022-11-02 DIAGNOSIS — M6281 Muscle weakness (generalized): Secondary | ICD-10-CM | POA: Diagnosis not present

## 2022-11-03 DIAGNOSIS — M6281 Muscle weakness (generalized): Secondary | ICD-10-CM | POA: Diagnosis not present

## 2022-11-07 DIAGNOSIS — M6281 Muscle weakness (generalized): Secondary | ICD-10-CM | POA: Diagnosis not present

## 2022-11-07 DIAGNOSIS — I10 Essential (primary) hypertension: Secondary | ICD-10-CM | POA: Diagnosis not present

## 2022-11-07 DIAGNOSIS — E119 Type 2 diabetes mellitus without complications: Secondary | ICD-10-CM | POA: Diagnosis not present

## 2022-11-07 DIAGNOSIS — E039 Hypothyroidism, unspecified: Secondary | ICD-10-CM | POA: Diagnosis not present

## 2022-11-08 DIAGNOSIS — M6281 Muscle weakness (generalized): Secondary | ICD-10-CM | POA: Diagnosis not present

## 2022-11-08 DIAGNOSIS — I10 Essential (primary) hypertension: Secondary | ICD-10-CM | POA: Diagnosis not present

## 2022-11-08 DIAGNOSIS — D649 Anemia, unspecified: Secondary | ICD-10-CM | POA: Diagnosis not present

## 2022-11-08 DIAGNOSIS — E785 Hyperlipidemia, unspecified: Secondary | ICD-10-CM | POA: Diagnosis not present

## 2022-11-08 DIAGNOSIS — G301 Alzheimer's disease with late onset: Secondary | ICD-10-CM | POA: Diagnosis not present

## 2022-11-09 DIAGNOSIS — M6281 Muscle weakness (generalized): Secondary | ICD-10-CM | POA: Diagnosis not present

## 2022-11-10 DIAGNOSIS — M6281 Muscle weakness (generalized): Secondary | ICD-10-CM | POA: Diagnosis not present

## 2022-11-13 DIAGNOSIS — M6281 Muscle weakness (generalized): Secondary | ICD-10-CM | POA: Diagnosis not present

## 2022-11-14 DIAGNOSIS — E119 Type 2 diabetes mellitus without complications: Secondary | ICD-10-CM | POA: Diagnosis not present

## 2022-11-14 DIAGNOSIS — M6281 Muscle weakness (generalized): Secondary | ICD-10-CM | POA: Diagnosis not present

## 2022-11-15 DIAGNOSIS — M6281 Muscle weakness (generalized): Secondary | ICD-10-CM | POA: Diagnosis not present

## 2022-11-15 DIAGNOSIS — E119 Type 2 diabetes mellitus without complications: Secondary | ICD-10-CM | POA: Diagnosis not present

## 2022-11-16 DIAGNOSIS — M6281 Muscle weakness (generalized): Secondary | ICD-10-CM | POA: Diagnosis not present

## 2022-11-16 DIAGNOSIS — N390 Urinary tract infection, site not specified: Secondary | ICD-10-CM | POA: Diagnosis not present

## 2022-11-16 DIAGNOSIS — R400 Somnolence: Secondary | ICD-10-CM | POA: Diagnosis not present

## 2022-11-17 DIAGNOSIS — G301 Alzheimer's disease with late onset: Secondary | ICD-10-CM | POA: Diagnosis not present

## 2022-11-17 DIAGNOSIS — E785 Hyperlipidemia, unspecified: Secondary | ICD-10-CM | POA: Diagnosis not present

## 2022-11-17 DIAGNOSIS — M6281 Muscle weakness (generalized): Secondary | ICD-10-CM | POA: Diagnosis not present

## 2022-11-17 DIAGNOSIS — D649 Anemia, unspecified: Secondary | ICD-10-CM | POA: Diagnosis not present

## 2022-11-17 DIAGNOSIS — I10 Essential (primary) hypertension: Secondary | ICD-10-CM | POA: Diagnosis not present

## 2022-11-20 DIAGNOSIS — M6281 Muscle weakness (generalized): Secondary | ICD-10-CM | POA: Diagnosis not present

## 2022-11-21 DIAGNOSIS — M6281 Muscle weakness (generalized): Secondary | ICD-10-CM | POA: Diagnosis not present

## 2022-11-22 DIAGNOSIS — M6281 Muscle weakness (generalized): Secondary | ICD-10-CM | POA: Diagnosis not present

## 2022-11-23 DIAGNOSIS — E169 Disorder of pancreatic internal secretion, unspecified: Secondary | ICD-10-CM | POA: Diagnosis not present

## 2022-11-23 DIAGNOSIS — E0590 Thyrotoxicosis, unspecified without thyrotoxic crisis or storm: Secondary | ICD-10-CM | POA: Diagnosis not present

## 2022-11-23 DIAGNOSIS — M6281 Muscle weakness (generalized): Secondary | ICD-10-CM | POA: Diagnosis not present

## 2022-11-24 DIAGNOSIS — M6281 Muscle weakness (generalized): Secondary | ICD-10-CM | POA: Diagnosis not present

## 2022-11-26 DIAGNOSIS — M6281 Muscle weakness (generalized): Secondary | ICD-10-CM | POA: Diagnosis not present

## 2022-11-27 DIAGNOSIS — M6281 Muscle weakness (generalized): Secondary | ICD-10-CM | POA: Diagnosis not present

## 2022-11-28 DIAGNOSIS — M6281 Muscle weakness (generalized): Secondary | ICD-10-CM | POA: Diagnosis not present

## 2022-11-29 DIAGNOSIS — M6281 Muscle weakness (generalized): Secondary | ICD-10-CM | POA: Diagnosis not present

## 2022-11-30 DIAGNOSIS — M6281 Muscle weakness (generalized): Secondary | ICD-10-CM | POA: Diagnosis not present

## 2022-11-30 DIAGNOSIS — R638 Other symptoms and signs concerning food and fluid intake: Secondary | ICD-10-CM | POA: Diagnosis not present

## 2022-11-30 DIAGNOSIS — E559 Vitamin D deficiency, unspecified: Secondary | ICD-10-CM | POA: Diagnosis not present

## 2022-11-30 DIAGNOSIS — D649 Anemia, unspecified: Secondary | ICD-10-CM | POA: Diagnosis not present

## 2022-11-30 DIAGNOSIS — J309 Allergic rhinitis, unspecified: Secondary | ICD-10-CM | POA: Diagnosis not present

## 2022-11-30 DIAGNOSIS — I10 Essential (primary) hypertension: Secondary | ICD-10-CM | POA: Diagnosis not present

## 2022-11-30 DIAGNOSIS — K219 Gastro-esophageal reflux disease without esophagitis: Secondary | ICD-10-CM | POA: Diagnosis not present

## 2022-11-30 DIAGNOSIS — E119 Type 2 diabetes mellitus without complications: Secondary | ICD-10-CM | POA: Diagnosis not present

## 2022-11-30 DIAGNOSIS — N189 Chronic kidney disease, unspecified: Secondary | ICD-10-CM | POA: Diagnosis not present

## 2022-11-30 DIAGNOSIS — E039 Hypothyroidism, unspecified: Secondary | ICD-10-CM | POA: Diagnosis not present

## 2022-11-30 DIAGNOSIS — I2510 Atherosclerotic heart disease of native coronary artery without angina pectoris: Secondary | ICD-10-CM | POA: Diagnosis not present

## 2022-11-30 DIAGNOSIS — R195 Other fecal abnormalities: Secondary | ICD-10-CM | POA: Diagnosis not present

## 2022-12-04 DIAGNOSIS — R5381 Other malaise: Secondary | ICD-10-CM | POA: Diagnosis not present

## 2022-12-04 DIAGNOSIS — N189 Chronic kidney disease, unspecified: Secondary | ICD-10-CM | POA: Diagnosis not present

## 2022-12-04 DIAGNOSIS — F32A Depression, unspecified: Secondary | ICD-10-CM | POA: Diagnosis not present

## 2022-12-04 DIAGNOSIS — E559 Vitamin D deficiency, unspecified: Secondary | ICD-10-CM | POA: Diagnosis not present

## 2022-12-04 DIAGNOSIS — J309 Allergic rhinitis, unspecified: Secondary | ICD-10-CM | POA: Diagnosis not present

## 2022-12-04 DIAGNOSIS — I2510 Atherosclerotic heart disease of native coronary artery without angina pectoris: Secondary | ICD-10-CM | POA: Diagnosis not present

## 2022-12-04 DIAGNOSIS — I10 Essential (primary) hypertension: Secondary | ICD-10-CM | POA: Diagnosis not present

## 2022-12-04 DIAGNOSIS — K219 Gastro-esophageal reflux disease without esophagitis: Secondary | ICD-10-CM | POA: Diagnosis not present

## 2022-12-04 DIAGNOSIS — E039 Hypothyroidism, unspecified: Secondary | ICD-10-CM | POA: Diagnosis not present

## 2022-12-04 DIAGNOSIS — E119 Type 2 diabetes mellitus without complications: Secondary | ICD-10-CM | POA: Diagnosis not present

## 2022-12-04 DIAGNOSIS — D649 Anemia, unspecified: Secondary | ICD-10-CM | POA: Diagnosis not present

## 2022-12-04 DIAGNOSIS — M6281 Muscle weakness (generalized): Secondary | ICD-10-CM | POA: Diagnosis not present

## 2022-12-05 DIAGNOSIS — M6281 Muscle weakness (generalized): Secondary | ICD-10-CM | POA: Diagnosis not present

## 2022-12-06 DIAGNOSIS — M6281 Muscle weakness (generalized): Secondary | ICD-10-CM | POA: Diagnosis not present

## 2022-12-07 DIAGNOSIS — M6281 Muscle weakness (generalized): Secondary | ICD-10-CM | POA: Diagnosis not present

## 2022-12-08 DIAGNOSIS — M6281 Muscle weakness (generalized): Secondary | ICD-10-CM | POA: Diagnosis not present

## 2022-12-11 ENCOUNTER — Encounter: Payer: Medicare Other | Admitting: Psychology

## 2022-12-11 DIAGNOSIS — M6281 Muscle weakness (generalized): Secondary | ICD-10-CM | POA: Diagnosis not present

## 2022-12-12 DIAGNOSIS — M6281 Muscle weakness (generalized): Secondary | ICD-10-CM | POA: Diagnosis not present

## 2022-12-13 DIAGNOSIS — M6281 Muscle weakness (generalized): Secondary | ICD-10-CM | POA: Diagnosis not present

## 2022-12-18 ENCOUNTER — Encounter: Payer: Medicare Other | Admitting: Psychology

## 2022-12-26 DIAGNOSIS — E785 Hyperlipidemia, unspecified: Secondary | ICD-10-CM | POA: Diagnosis not present

## 2022-12-26 DIAGNOSIS — I10 Essential (primary) hypertension: Secondary | ICD-10-CM | POA: Diagnosis not present

## 2022-12-26 DIAGNOSIS — G301 Alzheimer's disease with late onset: Secondary | ICD-10-CM | POA: Diagnosis not present

## 2022-12-26 DIAGNOSIS — D649 Anemia, unspecified: Secondary | ICD-10-CM | POA: Diagnosis not present

## 2022-12-29 DIAGNOSIS — R059 Cough, unspecified: Secondary | ICD-10-CM | POA: Diagnosis not present

## 2023-01-03 DIAGNOSIS — I2510 Atherosclerotic heart disease of native coronary artery without angina pectoris: Secondary | ICD-10-CM | POA: Diagnosis not present

## 2023-01-03 DIAGNOSIS — N189 Chronic kidney disease, unspecified: Secondary | ICD-10-CM | POA: Diagnosis not present

## 2023-01-03 DIAGNOSIS — D649 Anemia, unspecified: Secondary | ICD-10-CM | POA: Diagnosis not present

## 2023-01-03 DIAGNOSIS — I10 Essential (primary) hypertension: Secondary | ICD-10-CM | POA: Diagnosis not present

## 2023-01-03 DIAGNOSIS — R100 Acute abdomen: Secondary | ICD-10-CM | POA: Diagnosis not present

## 2023-01-03 DIAGNOSIS — E039 Hypothyroidism, unspecified: Secondary | ICD-10-CM | POA: Diagnosis not present

## 2023-01-03 DIAGNOSIS — R638 Other symptoms and signs concerning food and fluid intake: Secondary | ICD-10-CM | POA: Diagnosis not present

## 2023-01-03 DIAGNOSIS — E119 Type 2 diabetes mellitus without complications: Secondary | ICD-10-CM | POA: Diagnosis not present

## 2023-01-03 DIAGNOSIS — K219 Gastro-esophageal reflux disease without esophagitis: Secondary | ICD-10-CM | POA: Diagnosis not present

## 2023-01-03 DIAGNOSIS — J309 Allergic rhinitis, unspecified: Secondary | ICD-10-CM | POA: Diagnosis not present

## 2023-01-04 DIAGNOSIS — E039 Hypothyroidism, unspecified: Secondary | ICD-10-CM | POA: Diagnosis not present

## 2023-01-04 DIAGNOSIS — K219 Gastro-esophageal reflux disease without esophagitis: Secondary | ICD-10-CM | POA: Diagnosis not present

## 2023-01-04 DIAGNOSIS — I10 Essential (primary) hypertension: Secondary | ICD-10-CM | POA: Diagnosis not present

## 2023-01-04 DIAGNOSIS — R100 Acute abdomen: Secondary | ICD-10-CM | POA: Diagnosis not present

## 2023-01-04 DIAGNOSIS — E119 Type 2 diabetes mellitus without complications: Secondary | ICD-10-CM | POA: Diagnosis not present

## 2023-01-04 DIAGNOSIS — J309 Allergic rhinitis, unspecified: Secondary | ICD-10-CM | POA: Diagnosis not present

## 2023-01-04 DIAGNOSIS — N189 Chronic kidney disease, unspecified: Secondary | ICD-10-CM | POA: Diagnosis not present

## 2023-01-04 DIAGNOSIS — D649 Anemia, unspecified: Secondary | ICD-10-CM | POA: Diagnosis not present

## 2023-01-31 ENCOUNTER — Telehealth: Payer: Self-pay | Admitting: Endocrinology

## 2023-01-31 NOTE — Telephone Encounter (Signed)
Called patient to schedule Medicare Annual Wellness Visit (AWV). No voicemail available to leave a message.  Last date of AWV: 09/05/2021   Please schedule an appointment at any time with either Brianna Weber or Brianna Weber, NHA's. .  If any questions, please contact me at [phone].  Thank you,  Colletta Maryland,  [place] [facility] Direct Dial ??[phone]

## 2023-02-01 ENCOUNTER — Telehealth: Payer: Self-pay | Admitting: Endocrinology

## 2023-02-01 NOTE — Telephone Encounter (Signed)
Called patient to schedule Medicare Annual Wellness Visit (AWV). No voicemail available to leave a message.  Last date of AWV: 09/05/2021   Please schedule an appointment at any time with either Mickel Baas or Milligan, NHA's. .  If any questions, please contact me at [phone].  Thank you,  Colletta Maryland,  [employer] [facility] Direct Dial ??[phone]

## 2023-04-17 ENCOUNTER — Ambulatory Visit: Payer: Medicare Other | Admitting: Physician Assistant

## 2023-04-17 ENCOUNTER — Encounter: Payer: Self-pay | Admitting: Physician Assistant

## 2023-09-19 IMAGING — MR MR HEAD W/O CM
10 series · 48 of 48 positions shown · non-contrast
Comparison: MRI head 09/20/2018

CLINICAL DATA: Memory loss.  History of breast cancer

EXAM:
MRI HEAD WITHOUT CONTRAST
TECHNIQUE: Multiplanar, multiecho pulse sequences of the brain and surrounding
structures were obtained without intravenous contrast.

[Series 3: T1 · sagittal · 5.0mm · 0.45mm/px · 3 of 21 slices shown]
[im 1/21]
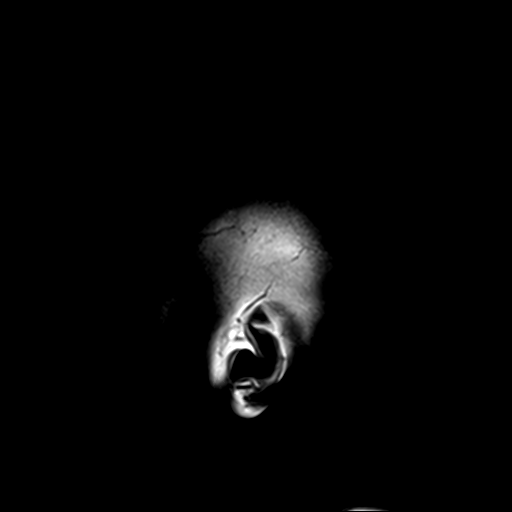
[im 11/21]
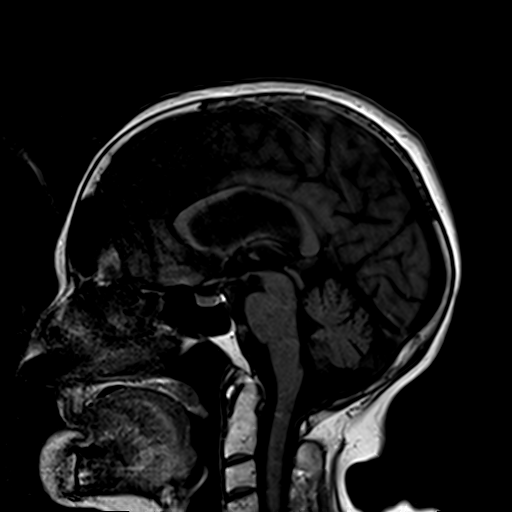
[im 21/21]
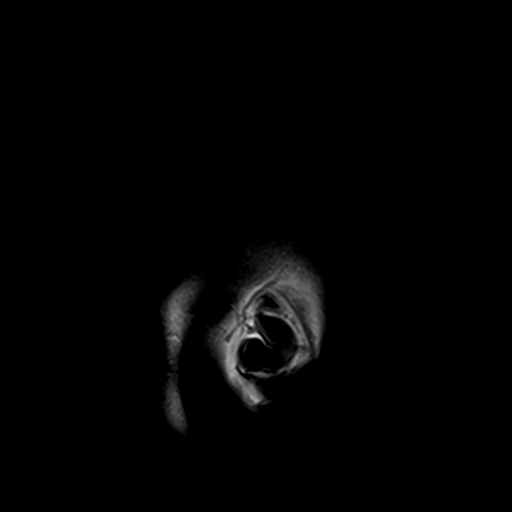

[Series 6: DWI · coronal · 5.0mm · 1.80mm/px · 6 of 72 slices shown (1 of 4)]
[im 1/72]
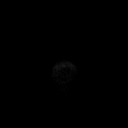
[im 15/72]
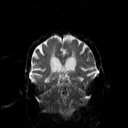
[im 29/72]
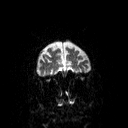
[im 43/72]
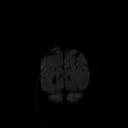
[im 57/72]
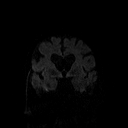
[im 72/72]
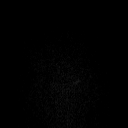

[Series 7: DWI · coronal · 5.0mm · 1.80mm/px · 3 of 36 slices shown (2 of 4)]
[im 1/36]
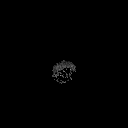
[im 18/36]
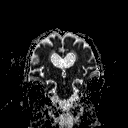
[im 36/36]
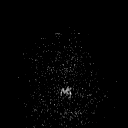

[Series 8: T2 · axial · 5.0mm · 0.51mm/px · z∈[-63,+83]mm · 2 of 22 slices shown (1 of 2)]
[im 1/22]
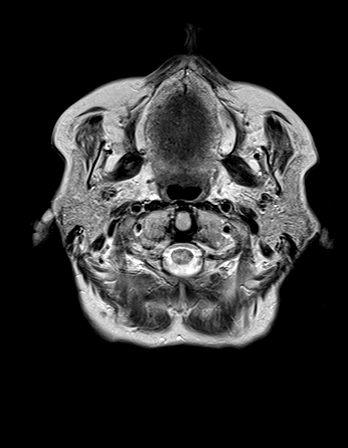
[im 22/22]
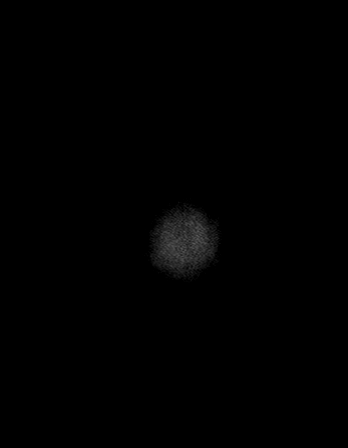

[Series 9: FLAIR · axial · 3.0mm · 0.45mm/px · z∈[-58,+76]mm · 3 of 30 slices shown]
[im 1/30]
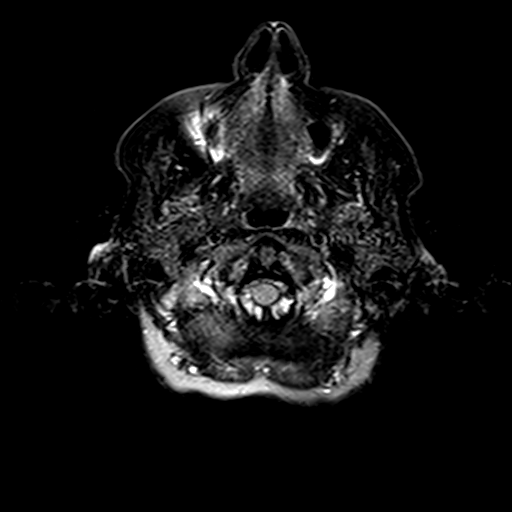
[im 15/30]
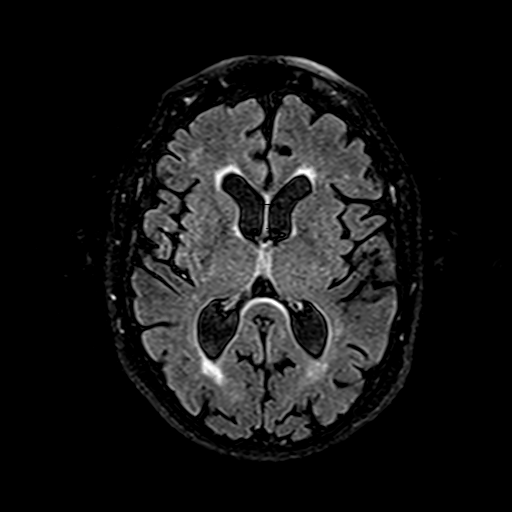
[im 30/30]
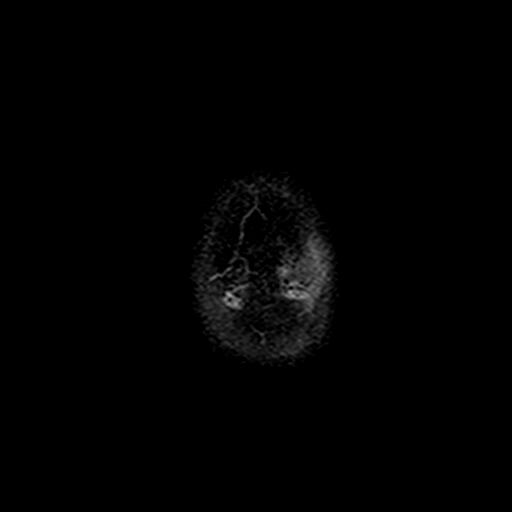

[Series 11: swi_images · axial · 4.0mm · 0.90mm/px · z∈[-61,+79]mm · 3 of 36 slices shown]
[im 1/36]
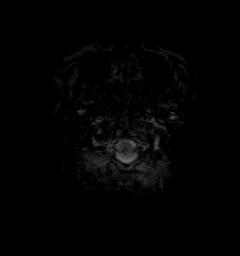
[im 18/36]
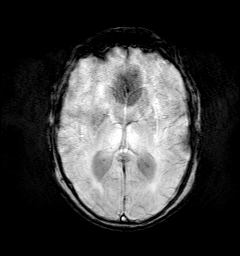
[im 36/36]
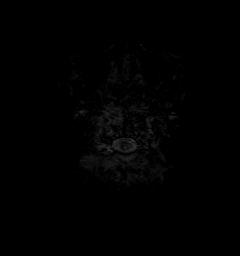

[Series 12: t1_mpr_tra · axial · 1.0mm · 0.71mm/px · z∈[-61,+81]mm · 13 of 144 slices shown]
[im 1/144]
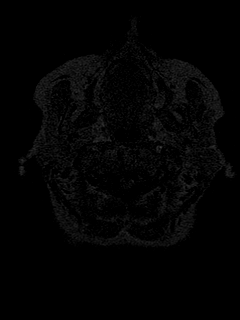
[im 12/144]
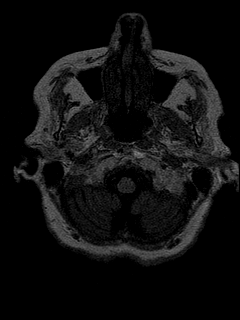
[im 24/144]
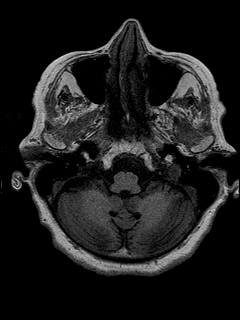
[im 36/144]
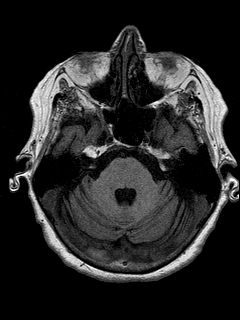
[im 48/144]
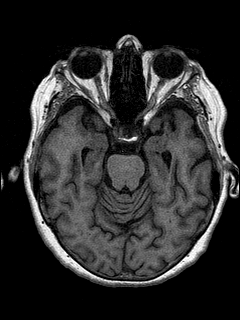
[im 60/144]
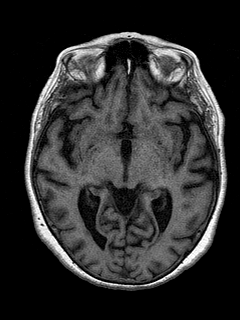
[im 72/144]
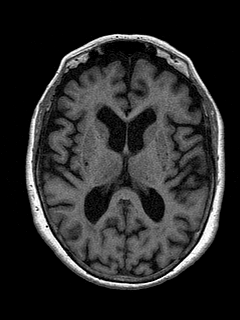
[im 84/144]
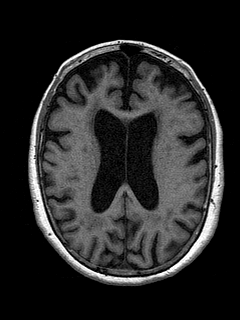
[im 96/144]
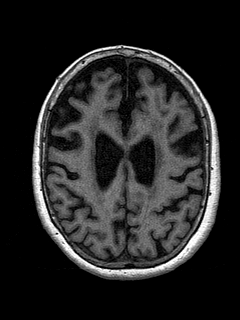
[im 108/144]
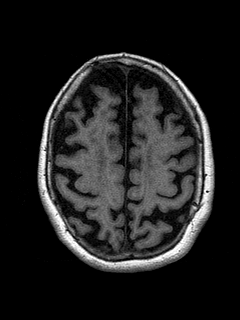
[im 120/144]
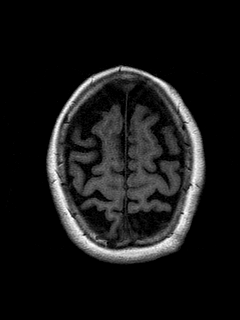
[im 132/144]
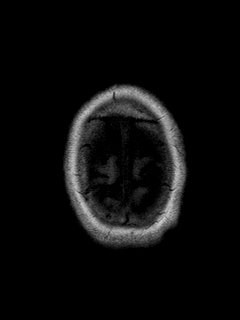
[im 144/144]
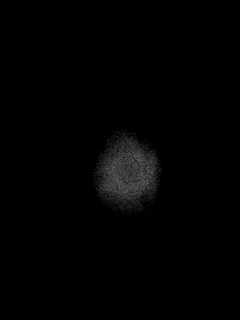

[Series 13: T2 · coronal · 5.0mm · 0.45mm/px · 2 of 25 slices shown (2 of 2)]
[im 1/25]
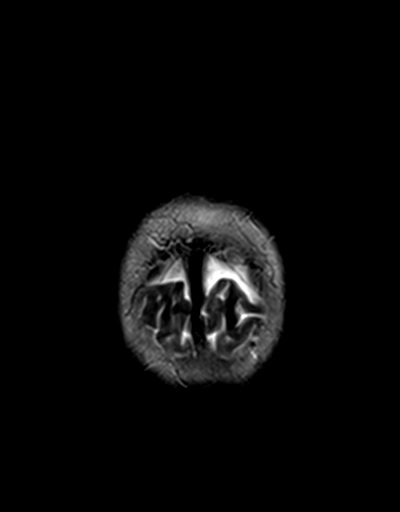
[im 25/25]
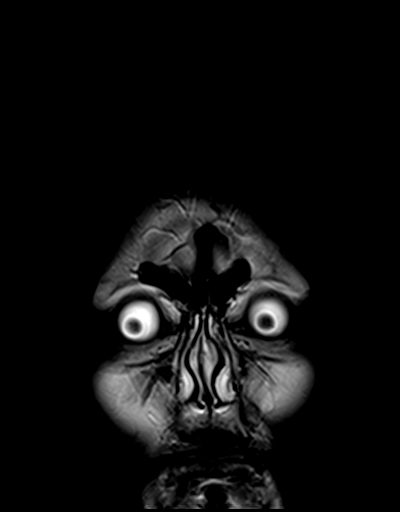

[Series 14: DWI · axial · 3.0mm · 1.80mm/px · z∈[-58,+88]mm · 9 of 99 slices shown (3 of 4)]
[im 1/99]
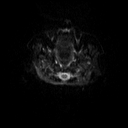
[im 13/99]
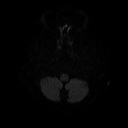
[im 25/99]
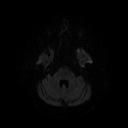
[im 37/99]
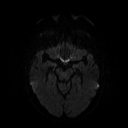
[im 50/99]
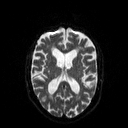
[im 62/99]
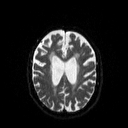
[im 74/99]
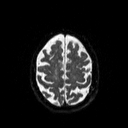
[im 86/99]
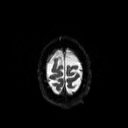
[im 99/99]
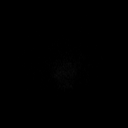

[Series 15: DWI · axial · 3.0mm · 1.80mm/px · z∈[-58,+88]mm · 4 of 50 slices shown (4 of 4)]
[im 1/50]
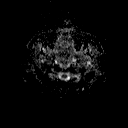
[im 17/50]
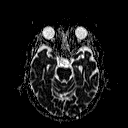
[im 33/50]
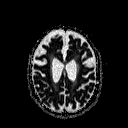
[im 50/50]
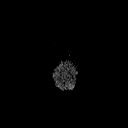

[48 of 48 positions shown; findings below may reference images not displayed]

FINDINGS: Brain: Moderate atrophy with progression since 8690. Moderate white
matter changes have progressed since the prior study.

Negative for acute infarct, hemorrhage, mass. Negative for
hydrocephalus

Vascular: Normal arterial flow voids at the skull base.

Skull and upper cervical spine: Negative

Sinuses/Orbits: Paranasal sinuses clear.  Negative orbit

Other: None
IMPRESSION: No acute abnormality

Progression of atrophy and chronic microvascular ischemic change
since [DATE].
